# Patient Record
Sex: Female | Born: 1955 | Race: White | Hispanic: No | Marital: Single | Smoking: Current every day smoker
Health system: Southern US, Community
[De-identification: ages and names within clinical notes are randomized; demographics above are authoritative.]

## PROBLEM LIST (undated history)

## (undated) DIAGNOSIS — E119 Type 2 diabetes mellitus without complications: Secondary | ICD-10-CM

## (undated) DIAGNOSIS — M5126 Other intervertebral disc displacement, lumbar region: Secondary | ICD-10-CM

## (undated) DIAGNOSIS — K648 Other hemorrhoids: Secondary | ICD-10-CM

## (undated) DIAGNOSIS — G8929 Other chronic pain: Secondary | ICD-10-CM

## (undated) DIAGNOSIS — M1990 Unspecified osteoarthritis, unspecified site: Secondary | ICD-10-CM

## (undated) DIAGNOSIS — E039 Hypothyroidism, unspecified: Secondary | ICD-10-CM

## (undated) DIAGNOSIS — K219 Gastro-esophageal reflux disease without esophagitis: Secondary | ICD-10-CM

## (undated) DIAGNOSIS — R519 Headache, unspecified: Secondary | ICD-10-CM

## (undated) DIAGNOSIS — M810 Age-related osteoporosis without current pathological fracture: Secondary | ICD-10-CM

## (undated) DIAGNOSIS — F329 Major depressive disorder, single episode, unspecified: Secondary | ICD-10-CM

## (undated) DIAGNOSIS — I10 Essential (primary) hypertension: Secondary | ICD-10-CM

## (undated) DIAGNOSIS — E049 Nontoxic goiter, unspecified: Secondary | ICD-10-CM

## (undated) DIAGNOSIS — F32A Depression, unspecified: Secondary | ICD-10-CM

## (undated) DIAGNOSIS — M797 Fibromyalgia: Secondary | ICD-10-CM

## (undated) DIAGNOSIS — D509 Iron deficiency anemia, unspecified: Secondary | ICD-10-CM

## (undated) DIAGNOSIS — K5790 Diverticulosis of intestine, part unspecified, without perforation or abscess without bleeding: Secondary | ICD-10-CM

## (undated) DIAGNOSIS — R51 Headache: Secondary | ICD-10-CM

## (undated) HISTORY — DX: Headache, unspecified: R51.9

## (undated) HISTORY — PX: BACK SURGERY: SHX140

## (undated) HISTORY — DX: Fibromyalgia: M79.7

## (undated) HISTORY — DX: Age-related osteoporosis without current pathological fracture: M81.0

## (undated) HISTORY — DX: Major depressive disorder, single episode, unspecified: F32.9

## (undated) HISTORY — DX: Other hemorrhoids: K64.8

## (undated) HISTORY — DX: Headache: R51

## (undated) HISTORY — PX: DILATION AND CURETTAGE OF UTERUS: SHX78

## (undated) HISTORY — DX: Other chronic pain: G89.29

## (undated) HISTORY — DX: Other intervertebral disc displacement, lumbar region: M51.26

## (undated) HISTORY — PX: OTHER SURGICAL HISTORY: SHX169

## (undated) HISTORY — DX: Iron deficiency anemia, unspecified: D50.9

## (undated) HISTORY — DX: Gastro-esophageal reflux disease without esophagitis: K21.9

## (undated) HISTORY — DX: Diverticulosis of intestine, part unspecified, without perforation or abscess without bleeding: K57.90

## (undated) HISTORY — DX: Unspecified osteoarthritis, unspecified site: M19.90

## (undated) HISTORY — DX: Type 2 diabetes mellitus without complications: E11.9

## (undated) HISTORY — DX: Depression, unspecified: F32.A

---

## 1998-02-15 ENCOUNTER — Other Ambulatory Visit: Admission: RE | Admit: 1998-02-15 | Discharge: 1998-02-15 | Payer: Self-pay | Admitting: Obstetrics and Gynecology

## 1999-09-22 ENCOUNTER — Emergency Department (HOSPITAL_COMMUNITY): Admission: EM | Admit: 1999-09-22 | Discharge: 1999-09-22 | Payer: Self-pay | Admitting: *Deleted

## 1999-12-16 ENCOUNTER — Encounter: Admission: RE | Admit: 1999-12-16 | Discharge: 1999-12-16 | Payer: Self-pay | Admitting: Obstetrics and Gynecology

## 1999-12-16 ENCOUNTER — Encounter: Payer: Self-pay | Admitting: Obstetrics and Gynecology

## 2000-01-15 ENCOUNTER — Encounter: Admission: RE | Admit: 2000-01-15 | Discharge: 2000-01-15 | Payer: Self-pay | Admitting: Family Medicine

## 2000-01-15 ENCOUNTER — Encounter: Payer: Self-pay | Admitting: Family Medicine

## 2000-07-14 ENCOUNTER — Encounter: Admission: RE | Admit: 2000-07-14 | Discharge: 2000-07-14 | Payer: Self-pay | Admitting: Family Medicine

## 2000-07-14 ENCOUNTER — Inpatient Hospital Stay (HOSPITAL_COMMUNITY): Admission: EM | Admit: 2000-07-14 | Discharge: 2000-07-17 | Payer: Self-pay | Admitting: Family Medicine

## 2000-07-14 ENCOUNTER — Encounter: Payer: Self-pay | Admitting: Family Medicine

## 2001-01-07 ENCOUNTER — Encounter: Admission: RE | Admit: 2001-01-07 | Discharge: 2001-01-07 | Payer: Self-pay | Admitting: Obstetrics and Gynecology

## 2001-01-07 ENCOUNTER — Encounter: Payer: Self-pay | Admitting: Obstetrics and Gynecology

## 2001-01-27 ENCOUNTER — Encounter: Payer: Self-pay | Admitting: Obstetrics and Gynecology

## 2001-01-27 ENCOUNTER — Encounter: Admission: RE | Admit: 2001-01-27 | Discharge: 2001-01-27 | Payer: Self-pay | Admitting: Obstetrics and Gynecology

## 2001-02-07 ENCOUNTER — Encounter: Payer: Self-pay | Admitting: Internal Medicine

## 2001-02-07 ENCOUNTER — Emergency Department (HOSPITAL_COMMUNITY): Admission: EM | Admit: 2001-02-07 | Discharge: 2001-02-07 | Payer: Self-pay | Admitting: Internal Medicine

## 2001-10-18 ENCOUNTER — Emergency Department (HOSPITAL_COMMUNITY): Admission: EM | Admit: 2001-10-18 | Discharge: 2001-10-18 | Payer: Self-pay | Admitting: *Deleted

## 2001-11-25 ENCOUNTER — Ambulatory Visit (HOSPITAL_COMMUNITY): Admission: RE | Admit: 2001-11-25 | Discharge: 2001-11-25 | Payer: Self-pay | Admitting: Family Medicine

## 2001-11-25 ENCOUNTER — Encounter: Payer: Self-pay | Admitting: Family Medicine

## 2002-03-10 ENCOUNTER — Encounter: Payer: Self-pay | Admitting: Family Medicine

## 2002-03-10 ENCOUNTER — Ambulatory Visit (HOSPITAL_COMMUNITY): Admission: RE | Admit: 2002-03-10 | Discharge: 2002-03-10 | Payer: Self-pay | Admitting: Family Medicine

## 2002-12-31 ENCOUNTER — Emergency Department (HOSPITAL_COMMUNITY): Admission: EM | Admit: 2002-12-31 | Discharge: 2002-12-31 | Payer: Self-pay | Admitting: Emergency Medicine

## 2003-04-11 ENCOUNTER — Ambulatory Visit (HOSPITAL_COMMUNITY): Admission: RE | Admit: 2003-04-11 | Discharge: 2003-04-11 | Payer: Self-pay | Admitting: Family Medicine

## 2003-09-18 ENCOUNTER — Emergency Department (HOSPITAL_COMMUNITY): Admission: EM | Admit: 2003-09-18 | Discharge: 2003-09-18 | Payer: Self-pay | Admitting: *Deleted

## 2004-02-16 ENCOUNTER — Ambulatory Visit (HOSPITAL_COMMUNITY): Admission: RE | Admit: 2004-02-16 | Discharge: 2004-02-16 | Payer: Self-pay | Admitting: Family Medicine

## 2004-05-07 ENCOUNTER — Other Ambulatory Visit: Admission: RE | Admit: 2004-05-07 | Discharge: 2004-05-07 | Payer: Self-pay

## 2004-05-10 ENCOUNTER — Ambulatory Visit (HOSPITAL_COMMUNITY): Admission: RE | Admit: 2004-05-10 | Discharge: 2004-05-10 | Payer: Self-pay | Admitting: Obstetrics and Gynecology

## 2004-08-29 ENCOUNTER — Ambulatory Visit (HOSPITAL_COMMUNITY): Admission: RE | Admit: 2004-08-29 | Discharge: 2004-08-29 | Payer: Self-pay | Admitting: Family Medicine

## 2005-04-18 ENCOUNTER — Ambulatory Visit (HOSPITAL_COMMUNITY): Admission: RE | Admit: 2005-04-18 | Discharge: 2005-04-18 | Payer: Self-pay | Admitting: Family Medicine

## 2006-02-24 ENCOUNTER — Ambulatory Visit (HOSPITAL_COMMUNITY): Admission: RE | Admit: 2006-02-24 | Discharge: 2006-02-24 | Payer: Self-pay | Admitting: Family Medicine

## 2006-12-31 ENCOUNTER — Ambulatory Visit (HOSPITAL_COMMUNITY): Admission: RE | Admit: 2006-12-31 | Discharge: 2006-12-31 | Payer: Self-pay | Admitting: Family Medicine

## 2007-02-26 ENCOUNTER — Ambulatory Visit (HOSPITAL_COMMUNITY): Admission: RE | Admit: 2007-02-26 | Discharge: 2007-02-26 | Payer: Self-pay | Admitting: Family Medicine

## 2007-09-19 ENCOUNTER — Emergency Department (HOSPITAL_COMMUNITY): Admission: EM | Admit: 2007-09-19 | Discharge: 2007-09-19 | Payer: Self-pay | Admitting: Emergency Medicine

## 2008-02-10 ENCOUNTER — Ambulatory Visit (HOSPITAL_COMMUNITY): Admission: RE | Admit: 2008-02-10 | Discharge: 2008-02-10 | Payer: Self-pay | Admitting: Family Medicine

## 2008-02-29 ENCOUNTER — Ambulatory Visit (HOSPITAL_COMMUNITY): Admission: RE | Admit: 2008-02-29 | Discharge: 2008-02-29 | Payer: Self-pay | Admitting: Family Medicine

## 2008-07-04 ENCOUNTER — Emergency Department (HOSPITAL_COMMUNITY): Admission: EM | Admit: 2008-07-04 | Discharge: 2008-07-04 | Payer: Self-pay | Admitting: Emergency Medicine

## 2008-10-30 ENCOUNTER — Encounter: Payer: Self-pay | Admitting: Gastroenterology

## 2008-11-07 ENCOUNTER — Encounter: Payer: Self-pay | Admitting: Gastroenterology

## 2008-11-15 ENCOUNTER — Encounter: Payer: Self-pay | Admitting: Gastroenterology

## 2008-12-01 ENCOUNTER — Encounter: Payer: Self-pay | Admitting: Gastroenterology

## 2009-01-31 ENCOUNTER — Ambulatory Visit: Payer: Self-pay | Admitting: Gastroenterology

## 2009-01-31 DIAGNOSIS — E1122 Type 2 diabetes mellitus with diabetic chronic kidney disease: Secondary | ICD-10-CM

## 2009-01-31 DIAGNOSIS — Z794 Long term (current) use of insulin: Secondary | ICD-10-CM

## 2009-01-31 DIAGNOSIS — K219 Gastro-esophageal reflux disease without esophagitis: Secondary | ICD-10-CM

## 2009-01-31 DIAGNOSIS — D509 Iron deficiency anemia, unspecified: Secondary | ICD-10-CM

## 2009-01-31 DIAGNOSIS — R1310 Dysphagia, unspecified: Secondary | ICD-10-CM | POA: Insufficient documentation

## 2009-01-31 DIAGNOSIS — N183 Chronic kidney disease, stage 3 (moderate): Secondary | ICD-10-CM

## 2009-02-01 ENCOUNTER — Ambulatory Visit: Payer: Self-pay | Admitting: Gastroenterology

## 2009-02-02 ENCOUNTER — Encounter: Payer: Self-pay | Admitting: Gastroenterology

## 2009-02-06 ENCOUNTER — Ambulatory Visit: Payer: Self-pay | Admitting: Gastroenterology

## 2009-02-06 LAB — CONVERTED CEMR LAB
Basophils Absolute: 0.1 10*3/uL (ref 0.0–0.1)
Basophils Relative: 1.1 % (ref 0.0–3.0)
Eosinophils Absolute: 0.1 10*3/uL (ref 0.0–0.7)
Eosinophils Relative: 1.4 % (ref 0.0–5.0)
HCT: 30.6 % — ABNORMAL LOW (ref 36.0–46.0)
Hemoglobin: 10.2 g/dL — ABNORMAL LOW (ref 12.0–15.0)
Lymphocytes Relative: 19.4 % (ref 12.0–46.0)
Lymphs Abs: 2 10*3/uL (ref 0.7–4.0)
MCHC: 33.2 g/dL (ref 30.0–36.0)
MCV: 80.5 fL (ref 78.0–100.0)
Monocytes Absolute: 0.4 10*3/uL (ref 0.1–1.0)
Monocytes Relative: 3.9 % (ref 3.0–12.0)
Neutro Abs: 7.7 10*3/uL (ref 1.4–7.7)
Neutrophils Relative %: 74.2 % (ref 43.0–77.0)
Platelets: 394 10*3/uL (ref 150.0–400.0)
RBC: 3.79 M/uL — ABNORMAL LOW (ref 3.87–5.11)
RDW: 17.4 % — ABNORMAL HIGH (ref 11.5–14.6)
WBC: 10.3 10*3/uL (ref 4.5–10.5)

## 2009-03-02 ENCOUNTER — Ambulatory Visit (HOSPITAL_COMMUNITY): Admission: RE | Admit: 2009-03-02 | Discharge: 2009-03-02 | Payer: Self-pay | Admitting: Family Medicine

## 2010-04-09 ENCOUNTER — Ambulatory Visit (HOSPITAL_COMMUNITY)
Admission: RE | Admit: 2010-04-09 | Discharge: 2010-04-09 | Payer: Self-pay | Source: Home / Self Care | Attending: Family Medicine | Admitting: Family Medicine

## 2010-07-03 LAB — GLUCOSE, CAPILLARY
Glucose-Capillary: 117 mg/dL — ABNORMAL HIGH (ref 70–99)
Glucose-Capillary: 129 mg/dL — ABNORMAL HIGH (ref 70–99)
Glucose-Capillary: 158 mg/dL — ABNORMAL HIGH (ref 70–99)
Glucose-Capillary: 91 mg/dL (ref 70–99)

## 2010-08-02 ENCOUNTER — Encounter: Payer: Self-pay | Admitting: Cardiology

## 2010-08-16 NOTE — Discharge Summary (Signed)
[facility]  Patient:    Linda Riggs, Linda Riggs                       MRN: [id number] Adm. Date:  [id number] Disc. Date: [id number] Attending:  Pamelia Hoit CC:         [facility]  Verlin Grills, M.D.  Katherine Roan, M.D.   Discharge Summary  ADMISSION DIAGNOSES: 1. Chronic intermittent abdominal pain. 2. Nausea and vomiting. 3. Microcytic anemia.  DISCHARGE DIAGNOSES: 1. Elevated liver transaminases probably secondary to nonalcoholic    steatohepatitis. 2. Iron deficiency anemia. 3. Hematochezia secondary to internal hemorrhoids. 4. Gastroesophageal reflux disease. 5. Type 2 diabetes newly diagnosed this admission. 6. History of depression. 7. Diverticulosis. 8. History of chronic headaches and chronic low back pain.  CONSULTANTS:  Verlin Grills, M.D.  PROCEDURES: 1. Esophagogastroduodenoscopy. 2. Colonoscopy.  BRIEF HISTORY:  This is a 55 year old, divorced, white female from [place], [place], with a history of recurrent abdominal pain for several months.  She has had several episodes of nausea and vomiting recently. She had been seen several times in the office recently with extensive laboratory work and on the day prior to admission, pelvic and abdominal CT scan, as well as abdominal ultrasound.  These studies were significant only for a tiny incidental gallbladder polyp, but no gallstones and fatty liver changes noted on CT of the abdomen.  She had not had any fevers prior to admission and no significant bowel changes.  She was admitted for symptom control and further evaluation.  ADMISSION LABORATORY DATA:  Her white blood cell count was 3300, hemoglobin 10.5, MCV 77.5, platelets 162,000, reticulocyte count normal, and sedimentation rate 39.  The PT was normal.  Iron saturation was decreased at 17%, ferritin elevated at 529, TIBC normal at 268.  Her calcium was 8.2, albumin 2.6, AST 60,  ALT 104, alkaline phosphatase 154, lipase normal, amylase normal, and total bilirubin normal.  HOSPITAL COURSE:  This is a 55 year old with a history of intermittent chronic symptoms of abdominal pain, nausea, and vomiting as above.  She was admitted for further evaluation and management of her symptoms.  She was maintained on Nexium which she had been started on prior to admission, as well as Reglan. Those were chronic medications.  A consult was made with Molli Hazard B. Daphine Deutscher, M.D.  He felt that further work-up of her nonspecific anemia was in order. She underwent EGD and colonoscopy on July 16, 2000.  Her EGD showed no significant abnormalities.  Her colonoscopy to the cecum was normal with the exception of left colon diverticulosis and some incidental internal hemorrhoids.  She had other laboratory tests to evaluate her elevated liver transaminases, which included ANA which was negative and hepatitis studies which were all negative.  Several other studies including seruloplasmin, serum protein electrophoresis, ANA, and antismooth muscle antibody studies were still pending at the time of discharge.  Symptomatically she improved after admission with basically resolution of her nausea and vomiting with IV fluids and Phenergan and no further abdominal or pelvic pain.  The suggestion had been made for GYN consult with S. Kyra Manges, M.D., her regular gynecologist.  Since her pelvic CT had been normal and her symptoms were basically resolving, it was elected to pursue this further as an outpatient. It was also noted that she had elevated CBG on admission with several CBGs over 126 and hemoglobin A1C which was high normal at 6.5%.  There is a very strong family history of type 2 diabetes.  It is felt that she will need close follow-up for diabetic teaching and weight loss following discharge.  At the time of discharge, her symptoms had basically resolved and with negative studies as above it  was felt that she could be followed further as an outpatient for further evaluation.  DISCHARGE CONDITION:  Improved.  DISCHARGE FOLLOW-UP: 1. [facility] approximately two weeks after discharge with    Rema Fendt, M.D., her primary care [place]. 2. With Katherine Roan, M.D., gynecologist, two to three weeks after    discharge. 3. She will need follow-up of several labs which are pending at the time of    discharge, including seruloplasmin level, serum protein electrophoresis,    AMA. and antismooth muscle antibodies.  The plan is that if her liver    transaminases remain greater than twofold over normal for over six months    and the above studies are negative, consider scheduling liver biopsy at    [facility]. [facility].  DISCHARGE MEDICATIONS: 1. Nexium 40 mg p.o. q.d. 2. Premphase 0.625/5 mg one p.o. q.d. 3. Reglan 5 mg q.i.d. 4. Levsin sublingually one to two q.4h. p.r.n. 5. Celexa 20 mg p.o. q.d.  DISCHARGE INSTRUCTIONS:  She is to call or follow-up if she develops any worsening abdominal pain, fever, or other problems prior to her scheduled follow-up.  She is also instructed to reduce her concentrated sweets.  She will need to be set up for diabetic teaching as an outpatient, which can be done throughout office. DD:  07/17/00 TD:  07/18/00 Job: [id number]

## 2010-08-16 NOTE — Op Note (Signed)
   NAME:  Linda Riggs, Linda Riggs                        ACCOUNT NO.:  [id number]   MEDICAL RECORD NO.:  [id number]                   PATIENT TYPE:  OUT   LOCATION:  RAD                                  FACILITY:  [facility]   PHYSICIAN:  Danise Edge, M.D.                DATE OF BIRTH:  06-07-1955   DATE OF PROCEDURE:  01/06/2002  DATE OF DISCHARGE:  11/25/2001                                 OPERATIVE REPORT   REFERRING PHYSICIAN:  L. Lupe Carney, M.D.   PROCEDURE PERFORMED:  Colonoscopy.   ENDOSCOPIST:  Charolett Bumpers, M.D.   INDICATIONS FOR PROCEDURE:  The patient is a 55 year old female born September 10, 1938.  The patient has unexplained left lower quadrant and right lower  quadrant cramping abdominal pain.   I discussed with the patient the complications associated with colonoscopy  and polypectomy including a 15 per 1000 risk of bleeding and 4 per 1000 risk  of colon perforation requiring surgical repair.  The patient has signed the  operative permit.   PREMEDICATION:  Fentanyl 50 mcg, Versed 5 mg.   INSTRUMENT USED:  Pediatric Olympus video colonoscope.   DESCRIPTION OF PROCEDURE:  After obtaining informed consent, the patient was  placed in the left lateral decubitus position.  I administered intravenous  fentanyl and intravenous Versed to achieve conscious sedation for the  procedure.  The patient's blood pressure, oxygen saturations and cardiac  rhythm were monitored throughout the procedure and documented in the medical  record.   Anal inspection was normal.  Digital rectal exam was normal.  The pediatric  Olympus video colonoscope was introduced into the rectum and advanced to 35  cm from the anal verge.  Due to colonic loop formation which could not be  controlled by applying external abdominal pressure and repositioning the  patient from the left lateral decubitus position to the supine position and  finally the right lateral decubitus position, a complete colonoscopy  was not  performed.   Endoscopic appearance of the rectum was normal.  Endoscopic appearance of  the distal sigmoid colon revealed left colonic diverticulosis.   ASSESSMENT:  Left colonic diverticulosis by flexible proctosigmoidoscopy  performed to 35 cm from the anal verge.  A complete colonoscopy was not  performed due to sigmoid colon loop formation.    PLAN:  I will schedule Ms. Waibel for an air contrast barium enema to be  performed at [facility] later this morning.                                                 Danise Edge, M.D.    MJ/MEDQ  D:  01/06/2002  T:  01/06/2002  Job:  [id number]   cc:   L. Lupe Carney, M.D.

## 2010-08-16 NOTE — Procedures (Signed)
[facility]  Patient:    Linda Riggs, Linda Riggs                       MRN: [id number] Proc. Date: 07/16/00 Attending:  Verlin Grills, M.D. CC:         Gloriajean Dell. Andrey Campanile, M.D.   Procedure Report  PROCEDURES:  Esophagogastroduodenoscopy and colonoscopy.  REFERRING PHYSICIAN:  Gloriajean Dell. Andrey Campanile, M.D.  PROCEDURE INDICATION:  Linda Riggs is a 55 year old female.  She tells me she has had chronic intermittent cramping lower abdominal pain, intermittent hematochezia, abnormal menses, low back pain, and dyspareunia. She reports no diarrhea or constipation.  In January 2002 her lower abdominal cramps became more frequent and intense.  For approximately 1-1/2 months she has had intermittent nausea and vomiting without heartburn, dysphagia, or odynophagia.  She denies weight loss and anorexia.  She underwent a D&C for menorrhagia in 1999.  She denies a family history of colon cancer.  PAST MEDICAL HISTORY:  Depression, chronic headaches, chronic low back pain, gastroesophageal reflux disease with a hiatal hernia by upper GI x-ray series, ruptured disc, 1999 D&C for abnormal uterine bleeding, iron deficiency anemia, left knee arthroscopy.  LABORATORY DATA:  July 13, 2000, thyroid stimulating hormone was slightly elevated with a normal free thyroxine index.  Hemoglobin 10.8 gm, MCV 79, white blood cell count 4600, platelet count 166,000.  Complete metabolic profile was abnormal for a glucose 189, albumin 3.2, alkaline phosphatase 189, GGT 220, SGOT 74, SGPT 101.  X-RAYS:  July 14, 2000, abdominal ultrasound was normal except for the presence of a gallbladder polyp.  CT scan of the abdomen and pelvis July 14, 2000, revealed a "fatty liver" and renal cyst; otherwise normal CT scan of the abdomen and pelvis.  CONSENT:  I discussed with [name] the complications associated with esophagogastroduodenoscopy including intestinal bleeding,  intestinal perforation.  [name] has signed the operative permit.  ENDOSCOPIST:  Verlin Grills, M.D.  PREMEDICATION:  Demerol 100 mg, Versed 10 mg.  ENDOSCOPE:  Olympus gastroscope.  PROCEDURE:  Esophagogastroduodenoscopy.  DESCRIPTION OF PROCEDURE:  After obtaining informed consent, the patient was placed in the left lateral decubitus position.  I administered intravenous Demerol and intravenous Versed to achieve conscious sedation for the procedure.  The patients blood pressure, oxygen saturation, and cardiac rhythm were monitored throughout the procedure and documented in the medical record.  The Olympus gastroscope was passed through the posterior hypopharynx into the proximal esophagus without difficulty.  The hypopharynx and larynx appeared normal.  I did not visualize the vocal cords.  Esophagoscopy:  The proximal mid and lower segments of the esophagus appeared normal.  Endoscopically there was no evidence for the presence of Barretts esophagus, erosive esophagitis, esophageal mucosal scarring, or esophageal ulceration.  Gastroscopy:  Retroflex view of the gastric cardiac and fundus was normal. The gastric body, antrum, and pylorus appeared normal.  Duodenoscopy:  The duodenal bulb and descending duodenum appeared normal.  ASSESSMENT:  Normal esophagogastroduodenoscopy.  PROCEDURE:  Proctocolonoscopy to the cecum.  Anal inspection was normal. Digital rectal exam was normal.  The Olympus pediatric video colonoscope was introduced into the rectum and under direct vision advanced to the cecum and identified by normal appearing ileocecal valve.  Colonic preparation for the exam today was excellent.  Rectum normal.  Sigmoid colon and descending colon:  Scattered colonic diverticulosis, but no signs of diverticulitis.  Splenic flexure normal.  Transverse colon normal.  Hepatic flexure normal.  Ascending  colon normal.  Cecum and ileocecal valve  normal.  ASSESSMENT: 1. Left colonic diverticulosis. 2. Moderate size, but nonbleeding internal hemorrhoids. 3. No endoscopic evidence for the presence of colorectal neoplasia or    inflammatory bowel disease.  ASSESSMENT:  I find no signs of gastrointestinal disease based on upper and lower gastrointestinal endoscopy today.  RECOMMENDATIONS:  Gynecologic consultation with Dr. Kyra Manges. DD:  07/16/00 TD:  07/17/00 Job: [id number]

## 2010-08-16 NOTE — H&P (Signed)
[facility]  Patient:    Linda Riggs, Linda Riggs                       MRN: [id number] Adm. Date:  [id number] Attending:  Pamelia Hoit                         History and Physical  IDENTIFICATION:  A 55 year old divorced white female from [place].  CHIEF COMPLAINT:  Abdominal pains, nausea, vomiting, back pains, headaches chronically.  HISTORY OF PRESENT ILLNESS:  Patient has had recurrent abdominal pains for several months.  No bowel changes.  She has had more nausea and vomiting and pain recently.  Was seen in the office yesterday and today.  Lab work yesterday and x-rays today.  Pelvic and abdominal CT scan and abdominal ultrasound were nonrevealing today.  She is admitted for symptom control, further evaluation, and treatment.  REVIEW OF SYSTEMS:  Also positive for multiple muscle pains, headaches, irregular and excessive periods.  Negative for bladder and bowel symptoms, fevers, chills, or respiratory symptoms.  Has been depressed in the past but feels that she just does not care now.  ALLERGIES:  SULFA and ASPIRIN.  I think these cause GI symptoms.  MEDICATIONS:  Premphase.  Vioxx 25 mg a day for knee pain.  Nexium 40 mg a day for GERD.  Reglan a.c. and h.s.  PAST MEDICAL HISTORY:  She is treated for GERD, history of depression.  Has had arthroscopic knee surgery for torn cartilage.  She quit smoking cigarettes a year ago.  She denies alcohol.  She has had a D&C in the past for irregular periods.  She has had two children.  A history of low back pain and disk disease.  SOCIAL AND FAMILY HISTORY:  She is one of nine children.  She has two children of her own, a boy and a girl.  Ex-husband was alcoholic.  Her mother is alive with diabetes, status post stroke and depression.  Father died of lung cancer. Patient works at [employer] in [place].  PHYSICAL EXAMINATION:  VITAL SIGNS:  Vital signs are normal, and she is afebrile.   Pulse 100, respirations 28, blood pressure 135/72.  Weight is 209.  GENERAL:  An obese white female, sleeping in bed with a man.  She is easily awakened.  Affect is flat.  Voice is monotone.  She is alert, oriented, and appropriate otherwise.  HEENT:  Unremarkable.  NECK:  Supple.  There is no adenopathy or thyroid enlargement or tenderness.  CHEST:  Lungs are clear to auscultation.  CARDIAC:  The heart exam is normal.  ABDOMEN:  Obese, soft, with active bowel sounds and diffusely, subjectively tender without rebound.  There is no mass.  BACK:  There is no back tenderness.  GENITOURINARY/RECTAL:  Deferred at this time.  EXTREMITIES:  Normal.  She moves all extremities well.  She has no peripheral edema.  No joint abnormalities of extremities.  SKIN:  Without a rash.  LABORATORY DATA:  Yesterday, nonfasting, white count 4000, platelets 166,000, hemoglobin 10.8, MCV 79, 85% neutrophils, 9% lymphocytes, 3% monos.  Sodium 136, potassium 3.4, BUN 10, creatinine 0.5, glucose 189.  Total cholesterol 178, triglycerides 208, HDL 47, LDL 89.  Liver functions slightly elevated with SGOT of 74, SGPT 101, total bilirubin normal.  Thyroid functions normal. Ultrasound of abdomen shows possible polyps in gallbladder.  No stones.  CT scan of abdomen and pelvis shows a fatty  infiltration of the liver.  Benign renal cysts.  Normal uterus and ovaries.  No fluid.  ASSESSMENT:  Chronic, recurring abdominal pains with nausea and vomiting and dehydration.  This is in a 55 year old obese white female.  Gastroesophageal reflux disease.  Obesity.  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  Iron deficiency anemia secondary to difficult, irregular periods. High nonfasting blood sugar.  PLAN:  Will ask GI to see.  Check hemoglobin A1C and CBGs.  N.P.O. and IV fluids for now.  Advance diet carefully.  Start an antidepressant, Celexa 20 mg a day.  Further treatment and evaluation  depending on results.  Will check hemoglobin A1C and amylase and lipase. DD:  07/14/00 TD:  07/15/00 Job: [id number]

## 2010-08-28 ENCOUNTER — Ambulatory Visit: Payer: Self-pay | Admitting: Cardiology

## 2010-09-06 ENCOUNTER — Telehealth: Payer: Self-pay | Admitting: *Deleted

## 2010-09-06 ENCOUNTER — Encounter: Payer: Self-pay | Admitting: Cardiology

## 2010-09-06 ENCOUNTER — Encounter: Payer: Self-pay | Admitting: *Deleted

## 2010-09-06 ENCOUNTER — Ambulatory Visit (INDEPENDENT_AMBULATORY_CARE_PROVIDER_SITE_OTHER): Payer: Medicare Other | Admitting: Cardiology

## 2010-09-06 DIAGNOSIS — R079 Chest pain, unspecified: Secondary | ICD-10-CM

## 2010-09-06 DIAGNOSIS — IMO0001 Reserved for inherently not codable concepts without codable children: Secondary | ICD-10-CM

## 2010-09-06 DIAGNOSIS — Z720 Tobacco use: Secondary | ICD-10-CM

## 2010-09-06 DIAGNOSIS — R0789 Other chest pain: Secondary | ICD-10-CM

## 2010-09-06 DIAGNOSIS — F17200 Nicotine dependence, unspecified, uncomplicated: Secondary | ICD-10-CM

## 2010-09-06 DIAGNOSIS — E782 Mixed hyperlipidemia: Secondary | ICD-10-CM

## 2010-09-06 NOTE — Assessment & Plan Note (Signed)
Improved, but not entirely resolved, outlined above. Baseline ECG shows a right bundle branch block pattern. Cardiac risk factors include active tobacco abuse, hyperlipidemia, type 2 diabetes mellitus. Plan is to proceed with an exercise Cardiolite, converting to [place] if needed. We can inform her of the results, and arrange followup as necessary. If her testing is low risk, would recommend general risk factor modification strategies, smoking cessation, limited caffeine, and regular exercise.

## 2010-09-06 NOTE — Telephone Encounter (Signed)
AUTH # [id number] EXPIRED 10/21/10

## 2010-09-06 NOTE — Assessment & Plan Note (Signed)
Followed by Dr. Nyland. 

## 2010-09-06 NOTE — Assessment & Plan Note (Signed)
On Lipitor, followed by Dr. Nyland. 

## 2010-09-06 NOTE — Assessment & Plan Note (Signed)
We discussed smoking cessation strategies today. 

## 2010-09-06 NOTE — Progress Notes (Signed)
Clinical Summary Linda Riggs is a 55 y.o.female referred for cardiology consultation by Dr. Lysbeth Galas. She has no history of coronary disease, but cardiac risk factors noted below. She reports an episode of chest pain that occurred at rest back in April, a sharp sensation that lasted for several minutes. Since then she has tried to cut back on smoking, watch her caffeine intake, and was also prescribed Celebrex by Dr. Lysbeth Galas. Reports that her symptoms have improved, however have not completely resolved. She does not report any clearly exertional component to the symptoms.  Faxed copy of her ECG shows normal sinus rhythm with a right bundle branch block pattern. No old tracing for comparison. She has not undergone any prior ischemic evaluation.  She does have a history of iron deficiency anemia and hematochezia, previously assessed by Dr. Arlyce Dice. Known history of internal hemorrhoids and diverticulosis. She reports no obvious melena or hematochezia. Also history of reflux and hiatal hernia.  She continues to work on smoking cessation. We discussed strategies to consider. She does not exercise regularly, but states that she feels better when she gets out of the garden. We discussed diet and exercise.  No reported palpitations or syncope.  Allergies  Allergen Reactions  . Flonase (Fluticasone Propionate) Itching  . Prednisone Itching  . Tramadol Itching    Current outpatient prescriptions:alendronate (FOSAMAX) 70 MG tablet, Take 70 mg by mouth every 7 (seven) days. Take with a full glass of water on an empty stomach. , Disp: , Rfl: ;  ALPRAZolam (XANAX) 1 MG tablet, Take 1 mg by mouth at bedtime as needed.  , Disp: , Rfl: ;  atorvastatin (LIPITOR) 40 MG tablet, Take 40 mg by mouth daily.  , Disp: , Rfl:  Calcium Carbonate-Vit D-Min (CALCIUM 600 + MINERALS) 600-200 MG-UNIT TABS, Take by mouth.  , Disp: , Rfl: ;  celecoxib (CELEBREX) 200 MG capsule, Take 145 mg by mouth daily.  , Disp: , Rfl: ;  DULoxetine  (CYMBALTA) 30 MG capsule, Take 30 mg by mouth daily.  , Disp: , Rfl: ;  escitalopram (LEXAPRO) 20 MG tablet, Take 20 mg by mouth daily.  , Disp: , Rfl:  esomeprazole (NEXIUM) 40 MG capsule, Take 40 mg by mouth daily before breakfast.  , Disp: , Rfl: ;  fexofenadine (ALLEGRA) 180 MG tablet, Take 180 mg by mouth daily.  , Disp: , Rfl: ;  glyBURIDE-metformin (GLUCOVANCE) 5-500 MG per tablet, Take 1 tablet by mouth daily with breakfast.  , Disp: , Rfl: ;  metFORMIN (GLUCOPHAGE) 500 MG tablet, Take 500 mg by mouth 2 (two) times daily with a meal.  , Disp: , Rfl:  potassium gluconate 595 MG TABS, Take 595 mg by mouth.  , Disp: , Rfl: ;  quinapril (ACCUPRIL) 10 MG tablet, Take 10 mg by mouth at bedtime.  , Disp: , Rfl: ;  senna (PX VEGETABLE LAXATIVE) 8.6 MG tablet, Take 1 tablet by mouth daily.  , Disp: , Rfl: ;  sitaGLIPtan (JANUVIA) 100 MG tablet, Take 100 mg by mouth daily.  , Disp: , Rfl: ;  TRAZODONE HCL PO, Take 100 mg by mouth daily at 8 pm. Take one tablet at bedtime. , Disp: , Rfl:   Past Medical History  Diagnosis Date  . Depression   . Fibromyalgia   . Arthritis   . Type 2 diabetes mellitus   . Iron deficiency anemia     Dr. Arlyce Dice 11/10  . GERD (gastroesophageal reflux disease)   . Chronic headaches   .  Diverticulosis   . Internal hemorrhoids     Past Surgical History  Procedure Date  . Arthroscopic left knee surgery   . Dilation and curettage of uterus     Family History  Problem Relation Age of Onset  . Diabetes type II Mother   . Diabetes type II Sister   . Diabetes type II Father   . Cancer Father     Lung    Social History Linda Riggs reports that she has been smoking Cigarettes.  She has been smoking about 1 pack per day. She does not have any smokeless tobacco history on file. Linda Riggs reports that she does not drink alcohol.  Review of Systems Has problems with chronic knee pain, has undergone bilateral arthroscopic procedures. Otherwise reviewed and  negative.  Physical Examination Filed Vitals:   09/06/10 0932  BP: 110/67  Pulse: 88  Temp: 98.1 F (36.7 C)  Resp: 18  Chronically ill-appearing woman, overweight, in no acute distress. HEENT: Conjunctiva and lids are normal, oropharynx with poor dentition. Neck: Supple, no elevated JVP or carotid bruits, no thyromegaly. Lungs: Are to auscultation, nonlabored. Cardiac: Regular rate and rhythm, no significant murmur or S3 gallop. Abdomen: Nontender, bowel sounds present. Skin: Warm and dry. Skeletal: No kyphosis. Extremities: No pitting edema, distal pulses 1-2+. Neuropsychiatric: Alert and oriented x3, affect appropriate.    Problem List and Plan

## 2010-09-06 NOTE — Telephone Encounter (Signed)
Exercise Cardiolite (may convert to Lexiscan if needed) Weight 180 DIAG( 786.50, 786.59, 272.2, 305.1, 250.02)   SET FOR 09-17-2010 @ [facility]

## 2010-09-06 NOTE — Patient Instructions (Signed)
Your physician has requested that you have an exercise stress Cardiolite. For further information please visit [email or web address] Please follow instruction sheet, as given. If the results of your test are normal or stable, you will receive a letter. If they are abnormal, the nurse will contact you by phone. Your physician recommends that you continue on your current medications as directed. Please refer to the Current Medication list given to you today.

## 2010-09-17 DIAGNOSIS — R072 Precordial pain: Secondary | ICD-10-CM

## 2011-04-15 ENCOUNTER — Other Ambulatory Visit (HOSPITAL_COMMUNITY): Payer: Self-pay | Admitting: Family Medicine

## 2011-04-15 DIAGNOSIS — Z139 Encounter for screening, unspecified: Secondary | ICD-10-CM

## 2011-04-21 ENCOUNTER — Ambulatory Visit (HOSPITAL_COMMUNITY)
Admission: RE | Admit: 2011-04-21 | Discharge: 2011-04-21 | Disposition: A | Discharge status: Home / Self Care | Payer: Medicare Other | Source: Ambulatory Visit | Attending: Family Medicine | Admitting: Family Medicine

## 2011-04-21 DIAGNOSIS — Z139 Encounter for screening, unspecified: Secondary | ICD-10-CM

## 2011-04-21 DIAGNOSIS — Z1231 Encounter for screening mammogram for malignant neoplasm of breast: Secondary | ICD-10-CM | POA: Insufficient documentation

## 2012-04-05 ENCOUNTER — Other Ambulatory Visit (HOSPITAL_COMMUNITY): Payer: Self-pay | Admitting: Family Medicine

## 2012-04-05 DIAGNOSIS — Z139 Encounter for screening, unspecified: Secondary | ICD-10-CM

## 2012-04-22 ENCOUNTER — Ambulatory Visit (HOSPITAL_COMMUNITY)
Admission: RE | Admit: 2012-04-22 | Discharge: 2012-04-22 | Disposition: A | Discharge status: Home / Self Care | Payer: Medicare Other | Source: Ambulatory Visit | Attending: Family Medicine | Admitting: Family Medicine

## 2012-04-22 DIAGNOSIS — Z1231 Encounter for screening mammogram for malignant neoplasm of breast: Secondary | ICD-10-CM | POA: Insufficient documentation

## 2012-04-22 DIAGNOSIS — Z139 Encounter for screening, unspecified: Secondary | ICD-10-CM

## 2012-10-21 ENCOUNTER — Other Ambulatory Visit (HOSPITAL_COMMUNITY): Payer: Self-pay | Admitting: "Endocrinology

## 2012-10-21 DIAGNOSIS — E049 Nontoxic goiter, unspecified: Secondary | ICD-10-CM

## 2012-10-26 ENCOUNTER — Ambulatory Visit (HOSPITAL_COMMUNITY)
Admission: RE | Admit: 2012-10-26 | Discharge: 2012-10-26 | Disposition: A | Discharge status: Home / Self Care | Payer: Medicare Other | Source: Ambulatory Visit | Attending: "Endocrinology | Admitting: "Endocrinology

## 2012-10-26 DIAGNOSIS — E049 Nontoxic goiter, unspecified: Secondary | ICD-10-CM | POA: Insufficient documentation

## 2013-02-01 ENCOUNTER — Other Ambulatory Visit (HOSPITAL_COMMUNITY): Payer: Self-pay | Admitting: "Endocrinology

## 2013-02-01 DIAGNOSIS — E042 Nontoxic multinodular goiter: Secondary | ICD-10-CM

## 2013-03-14 ENCOUNTER — Other Ambulatory Visit (HOSPITAL_COMMUNITY): Payer: Self-pay | Admitting: Family Medicine

## 2013-03-14 DIAGNOSIS — Z139 Encounter for screening, unspecified: Secondary | ICD-10-CM

## 2013-04-25 ENCOUNTER — Ambulatory Visit (HOSPITAL_COMMUNITY)
Admission: RE | Admit: 2013-04-25 | Discharge: 2013-04-25 | Disposition: A | Discharge status: Home / Self Care | Payer: Medicare Other | Source: Ambulatory Visit | Attending: Family Medicine | Admitting: Family Medicine

## 2013-04-25 ENCOUNTER — Ambulatory Visit (HOSPITAL_COMMUNITY)
Admission: RE | Admit: 2013-04-25 | Discharge: 2013-04-25 | Disposition: A | Discharge status: Home / Self Care | Payer: Medicare Other | Source: Ambulatory Visit | Attending: "Endocrinology | Admitting: "Endocrinology

## 2013-04-25 DIAGNOSIS — Z1231 Encounter for screening mammogram for malignant neoplasm of breast: Secondary | ICD-10-CM | POA: Insufficient documentation

## 2013-04-25 DIAGNOSIS — E042 Nontoxic multinodular goiter: Secondary | ICD-10-CM | POA: Insufficient documentation

## 2013-04-25 DIAGNOSIS — Z139 Encounter for screening, unspecified: Secondary | ICD-10-CM

## 2013-05-31 ENCOUNTER — Encounter (HOSPITAL_COMMUNITY): Payer: Self-pay | Admitting: Pharmacy Technician

## 2013-06-01 NOTE — Patient Instructions (Signed)
Linda Riggs  06/01/2013   Your procedure is scheduled on:  06/06/2013  Report to [facility] at  615 AM.  Call this number if you have problems the morning of surgery: [phone]   Remember:   Do not eat food or drink liquids after midnight.   Take these medicines the morning of surgery with A SIP OF WATER: xanax, celebrex, cymbalta.lexapro, nexium, allegra, levothyroxine, accupril   Do not wear jewelry, make-up or nail polish.  Do not wear lotions, powders, or perfumes.   Do not shave 48 hours prior to surgery. Men may shave face and neck.  Do not bring valuables to the hospital.  [facility] belongings or valuables.               Contacts, dentures or bridgework may not be worn into surgery.  Leave suitcase in the car. After surgery it may be brought to your room.  For patients admitted to the hospital, discharge time is determined by your treatment team.               Patients discharged the day of surgery will not be allowed to drive home.  Name and phone number of your driver: family  Special Instructions: Shower using CHG 2 nights before surgery and the night before surgery.  If you shower the day of surgery use CHG.  Use special wash - you have one bottle of CHG for all showers.  You should use approximately 1/3 of the bottle for each shower.   Please read over the following fact sheets that you were given: Pain Booklet, Coughing and Deep Breathing, Surgical Site Infection Prevention, Anesthesia Post-op Instructions and Care and Recovery After Surgery Thyroidectomy Thyroidectomy is the removal of part or all of your thyroid gland. Your thyroid gland is a butterfly-shaped gland at the base of your neck. It produces a substance called thyroid hormone, which regulates the physical and chemical processes that keep your body functioning and make energy available to your body (metabolism). The amount of thyroid gland tissue that is removed during a  thyroidectomy depends on the reason for the procedure. Typically, if only a part of your gland is removed, enough thyroid gland tissue remains to maintain normal function. If your entire thyroid gland is removed or if the amount of thyroid gland tissue remaining is inadequate to maintain normal function, you will need life-long treatment with thyroid hormone on a daily basis. Thyroidectomy maybe performed when you have the following conditions:  Thyroid nodules. These are small, abnormal collections of tissue that form inside the thyroid gland. If these nodules begin to enlarge at a rapid rate, a sample of tissue from the nodule is taken through a needle and examined (needle biopsy). This is done to determine if the nodules are cancerous. Depending on the outcome of this exam, thyroidectomy may be necessary.  Thyroid cancer.  Goiter, which is an enlarged thyroid gland. All or part of the thyroid gland may be removed if the gland has become so large that it causes difficulty breathing or swallowing.  Hyperthyroidism. This is when the thyroid gland produces too much thyroid hormone. Hypothyroidism can cause symptoms of fluctuating weight, intolerance to heat, irritability, shortness of breath, and chest pain. LET YOUR CAREGIVER KNOW ABOUT:   Allergies to food or medicine.  Medicines that you are taking, including vitamins, herbs, eyedrops, over-the-counter medicines, and creams.  Previous problems you have had with anesthetics or  numbing medicines.  History of bleeding problems or blood clots.  Previous surgeries you have had.  Other health problems, including diabetes and kidney problems, you have had.  Possibility of pregnancy, if this applies. BEFORE THE PROCEDURE   Do not eat or drink anything, including water, for at least 6 hours before the procedure.  Ask your caregiver whether you should stop taking certain medicines before the day of the procedure. PROCEDURE  There are different  ways that thyroidectomy is performed. For each type, you will be given a medicine to make you sleep (general anesthetic). The three main types of thyroidectomy are listed as follows:  Conventional thyroidectomy A cut (incision) in the center portion of your lower neck is made with a scalpel. Muscles below your skin are separated to gain access to your thyroid gland. Your thyroid gland is dissected from your windpipe (trachea). Often a drain is placed at the incision site to drain any blood that accumulates under the skin after the procedure. This drain will be removed before you go home. The wound from the incision should heal within 2 weeks.  Endoscopic thyroidectomy Small incisions are made in your lower neck. A small instrument (endoscope) is inserted under your skin at the incision sites. The endoscope used for thyroidectomy consists of 2 flexible tubes. Inside one of the tubes is a video camera that is used to guide the Careers adviser. Tools to remove the thyroid gland, including a tool to cut the gland (dissectors) and a suction device, are inserted through the other tube. The surgeon uses the dissectors to dissect the thyroid gland from the trachea and remove it.  Robotic thyroidectomy This procedure allows your thyroid gland to be removed through incisions in your armpit, your chest, or high in your neck. Instruments similar to endoscopes provide a 3-dimensional picture of the surgical site. Dissecting instruments are controlled by devices similar to joysticks. These devices allow more accurate manipulation of the instruments. After the blood supply to the gland is removed, the gland is cut into several pieces and removed through the incisions. RISKS AND COMPLICATIONS Complications associated with thyroidectomy are rare, but they can occur. Possible complications include:  A decrease in parathyroid hormone levels (hypoparathyroidism) Your parathyroid glands are located close behind your thyroid gland.  They are responsible for maintaining calcium levels inthe body. If they are damaged or removed, levels of calcium in the blood become low and nerves become irritable, which can cause muscle spasms. Medicines are available to treat this.  Bacterial infection This can often be treated with medicines that kill bacteria (antibiotics).  Damage to your voice box nerves This could cause hoarseness or complete loss of voice.  Bleeding or airway obstruction. AFTER THE PROCEDURE   You will rest in the recovery room as you wake up.  When you first wake up, your throat may feel slightly sore.  You will not be allowed to eat or drink until instructed otherwise.  You will be taken to your hospital room. You will usually stay at the hospital for 1 or 2 nights.  If a drain is placed during the procedure, it usually is removed the next day.  You may have some mild neck pain.  Your voice may be weak. This usually is temporary. Document Released: 09/10/2000 Document Revised: 07/12/2012 Document Reviewed: 06/19/2010 [facility] Patient Information 2014 [place], [place]. PATIENT INSTRUCTIONS POST-ANESTHESIA  IMMEDIATELY FOLLOWING SURGERY:  Do not drive or operate machinery for the first twenty four hours after surgery.  Do not make  any important decisions for twenty four hours after surgery or while taking narcotic pain medications or sedatives.  If you develop intractable nausea and vomiting or a severe headache please notify your doctor immediately.  FOLLOW-UP:  Please make an appointment with your surgeon as instructed. You do not need to follow up with anesthesia unless specifically instructed to do so.  WOUND CARE INSTRUCTIONS (if applicable):  Keep a dry clean dressing on the anesthesia/puncture wound site if there is drainage.  Once the wound has quit draining you may leave it open to air.  Generally you should leave the bandage intact for twenty four hours unless there is drainage.  If the epidural  site drains for more than 36-48 hours please call the anesthesia department.  QUESTIONS?:  Please feel free to call your physician or the hospital operator if you have any questions, and they will be happy to assist you.

## 2013-06-01 NOTE — H&P (Signed)
  NTS SOAP Note  Vital Signs:  Vitals as of: 05/31/2013: Systolic 114: Diastolic 67: Heart Rate 96: Temp 97.69F: Height 565ft 2in: Weight 152Lbs 0 Ounces: BMI 27.8  BMI : 27.8 kg/m2  Subjective: This 58 Years 560 Months old Female presents for an enlarged thryoid.  Has been present for some time.  Previous biopsy results negative for malignancy.  Has been told to have surgery in past, but has refused.  States she is having more dysphagia.  No family h/o thyroid cancer,  neck irradiation, weight change, voice changes.  No airway compromise.   Review of Symptoms:  Constitutional:unremarkable   Head:unremarkable    Eyes:unremarkable   Nose/Mouth/Throat:neck mass ,neck pain  Cardiovascular:  unremarkable   Respiratory:  dyspnea,cough Gastrointestinal:  unremarkable   Genitourinary:    frequency   joint and back pain Skin:unremarkable Hematolgic/Lymphatic:unremarkable     Allergic/Immunologic:unremarkable     Past Medical History:    Reviewed  Past Medical History  Surgical History: knee surgery Medical Problems: NIDDM, HTN, mulinodular goiter, high cholesterol, hypothyroidism Allergies: prednisone, flonase Medications: invokanna, levothyroxine, celebrex, nexium, atorvastatin, trazadone, metformin, quinapril, alprazolam, fenofibrate   Social History:Reviewed  Social History  Preferred Language: English Race:  White Ethnicity: Not Hispanic / Latino Age: 58 Years 0 Months Marital Status:  S Alcohol: no Recreational drug(s): no   Smoking Status: Current every day smoker reviewed on 05/31/2013 Started Date: 04/01/1975 Packs per day: 1.00 Functional Status reviewed on 05/31/2013 ------------------------------------------------ Bathing: Normal Cooking: Normal Dressing: Normal Driving: Normal Eating: Normal Managing Meds: Normal Oral Care: Normal Shopping: Normal Toileting: Normal Transferring: Normal Walking: Normal Cognitive  Status reviewed on 05/31/2013 ------------------------------------------------ Attention: Normal Decision Making: Normal Language: Normal Memory: Normal Motor: Normal Perception: Normal Problem Solving: Normal Visual and Spatial: Normal   Family History:  Reviewed  Family Health History Mother, Deceased; History Unknown Father, Deceased; Lung cancer;     Objective Information: General:  Well appearing, well nourished in no distress.   Significantly enlarged thyroid gland, left >> right with nodularity noted.  No tracheal deviation. Heart:  RRR, no murmur Lungs:    CTA bilaterally, no wheezes, rhonchi, rales.  Breathing unlabored.  Assessment:Multinodular goiter  Diagnoses: 240.9 Diffuse goiter (Nontoxic diffuse goiter)  Procedures: [id number] - OFFICE OUTPATIENT NEW 30 MINUTES    Plan:  Scheduled for total thyroidectomy on 06/06/13.   Patient Education:Alternative treatments to surgery were discussed with patient (and family).  Risks and benefits  of procedure including bleeding, infection, nerve injury, and voice changes were fully explained to the patient (and family) who gave informed consent. Patient/family questions were addressed.  Follow-up:Pending Surgery

## 2013-06-02 ENCOUNTER — Encounter (HOSPITAL_COMMUNITY): Payer: Self-pay

## 2013-06-02 ENCOUNTER — Other Ambulatory Visit: Payer: Self-pay

## 2013-06-02 ENCOUNTER — Encounter (HOSPITAL_COMMUNITY)
Admission: RE | Admit: 2013-06-02 | Discharge: 2013-06-02 | Disposition: A | Discharge status: Home / Self Care | Payer: Medicare Other | Source: Ambulatory Visit | Attending: General Surgery | Admitting: General Surgery

## 2013-06-02 DIAGNOSIS — Z01810 Encounter for preprocedural cardiovascular examination: Secondary | ICD-10-CM | POA: Insufficient documentation

## 2013-06-02 DIAGNOSIS — Z01812 Encounter for preprocedural laboratory examination: Secondary | ICD-10-CM | POA: Insufficient documentation

## 2013-06-02 HISTORY — DX: Nontoxic goiter, unspecified: E04.9

## 2013-06-02 LAB — CBC WITH DIFFERENTIAL/PLATELET
BASOS ABS: 0.1 10*3/uL (ref 0.0–0.1)
Basophils Relative: 1 % (ref 0–1)
EOS ABS: 0.2 10*3/uL (ref 0.0–0.7)
EOS PCT: 2 % (ref 0–5)
HEMATOCRIT: 38.1 % (ref 36.0–46.0)
Hemoglobin: 12.7 g/dL (ref 12.0–15.0)
LYMPHS ABS: 2 10*3/uL (ref 0.7–4.0)
LYMPHS PCT: 27 % (ref 12–46)
MCH: 28.1 pg (ref 26.0–34.0)
MCHC: 33.3 g/dL (ref 30.0–36.0)
MCV: 84.3 fL (ref 78.0–100.0)
MONO ABS: 0.4 10*3/uL (ref 0.1–1.0)
Monocytes Relative: 5 % (ref 3–12)
Neutro Abs: 4.8 10*3/uL (ref 1.7–7.7)
Neutrophils Relative %: 66 % (ref 43–77)
PLATELETS: 341 10*3/uL (ref 150–400)
RBC: 4.52 MIL/uL (ref 3.87–5.11)
RDW: 14.7 % (ref 11.5–15.5)
WBC: 7.4 10*3/uL (ref 4.0–10.5)

## 2013-06-02 LAB — COMPREHENSIVE METABOLIC PANEL
ALT: 21 U/L (ref 0–35)
AST: 17 U/L (ref 0–37)
Albumin: 3.8 g/dL (ref 3.5–5.2)
Alkaline Phosphatase: 47 U/L (ref 39–117)
BUN: 26 mg/dL — ABNORMAL HIGH (ref 6–23)
CALCIUM: 10.7 mg/dL — AB (ref 8.4–10.5)
CO2: 22 meq/L (ref 19–32)
CREATININE: 0.94 mg/dL (ref 0.50–1.10)
Chloride: 100 mEq/L (ref 96–112)
GFR, EST AFRICAN AMERICAN: 76 mL/min — AB (ref >90)
GFR, EST NON AFRICAN AMERICAN: 66 mL/min — AB (ref >90)
GLUCOSE: 219 mg/dL — AB (ref 70–99)
Potassium: 4.7 mEq/L (ref 3.7–5.3)
Sodium: 137 mEq/L (ref 137–147)
TOTAL PROTEIN: 7.2 g/dL (ref 6.0–8.3)
Total Bilirubin: 0.2 mg/dL — ABNORMAL LOW (ref 0.3–1.2)

## 2013-06-02 NOTE — Pre-Procedure Instructions (Signed)
Information given to patient to set this up at home.

## 2013-06-06 ENCOUNTER — Ambulatory Visit (HOSPITAL_COMMUNITY): Payer: Medicare Other | Admitting: Anesthesiology

## 2013-06-06 ENCOUNTER — Encounter (HOSPITAL_COMMUNITY): Payer: Self-pay | Admitting: *Deleted

## 2013-06-06 ENCOUNTER — Encounter (HOSPITAL_COMMUNITY): Payer: Medicare Other | Admitting: Anesthesiology

## 2013-06-06 ENCOUNTER — Observation Stay (HOSPITAL_COMMUNITY)
Admission: RE | Admit: 2013-06-06 | Discharge: 2013-06-07 | Disposition: A | Discharge status: Home / Self Care | Payer: Medicare Other | Source: Ambulatory Visit | Attending: General Surgery | Admitting: General Surgery

## 2013-06-06 ENCOUNTER — Encounter (HOSPITAL_COMMUNITY)
Admission: RE | Disposition: A | Discharge status: Home / Self Care | Payer: Self-pay | Source: Ambulatory Visit | Attending: General Surgery

## 2013-06-06 DIAGNOSIS — E039 Hypothyroidism, unspecified: Secondary | ICD-10-CM | POA: Insufficient documentation

## 2013-06-06 DIAGNOSIS — F3289 Other specified depressive episodes: Secondary | ICD-10-CM | POA: Insufficient documentation

## 2013-06-06 DIAGNOSIS — E119 Type 2 diabetes mellitus without complications: Secondary | ICD-10-CM | POA: Insufficient documentation

## 2013-06-06 DIAGNOSIS — E7800 Pure hypercholesterolemia, unspecified: Secondary | ICD-10-CM | POA: Insufficient documentation

## 2013-06-06 DIAGNOSIS — R51 Headache: Secondary | ICD-10-CM | POA: Insufficient documentation

## 2013-06-06 DIAGNOSIS — E042 Nontoxic multinodular goiter: Principal | ICD-10-CM | POA: Diagnosis present

## 2013-06-06 DIAGNOSIS — K219 Gastro-esophageal reflux disease without esophagitis: Secondary | ICD-10-CM | POA: Insufficient documentation

## 2013-06-06 DIAGNOSIS — IMO0001 Reserved for inherently not codable concepts without codable children: Secondary | ICD-10-CM | POA: Insufficient documentation

## 2013-06-06 DIAGNOSIS — I10 Essential (primary) hypertension: Secondary | ICD-10-CM | POA: Insufficient documentation

## 2013-06-06 DIAGNOSIS — F17200 Nicotine dependence, unspecified, uncomplicated: Secondary | ICD-10-CM | POA: Insufficient documentation

## 2013-06-06 DIAGNOSIS — F329 Major depressive disorder, single episode, unspecified: Secondary | ICD-10-CM | POA: Insufficient documentation

## 2013-06-06 HISTORY — PX: THYROIDECTOMY: SHX17

## 2013-06-06 LAB — COMPREHENSIVE METABOLIC PANEL
ALT: 17 U/L (ref 0–35)
AST: 17 U/L (ref 0–37)
Albumin: 3 g/dL — ABNORMAL LOW (ref 3.5–5.2)
Alkaline Phosphatase: 34 U/L — ABNORMAL LOW (ref 39–117)
BUN: 28 mg/dL — AB (ref 6–23)
CALCIUM: 8.4 mg/dL (ref 8.4–10.5)
CO2: 25 meq/L (ref 19–32)
CREATININE: 0.97 mg/dL (ref 0.50–1.10)
Chloride: 107 mEq/L (ref 96–112)
GFR, EST AFRICAN AMERICAN: 73 mL/min — AB (ref >90)
GFR, EST NON AFRICAN AMERICAN: 63 mL/min — AB (ref >90)
GLUCOSE: 122 mg/dL — AB (ref 70–99)
Potassium: 4.9 mEq/L (ref 3.7–5.3)
Sodium: 141 mEq/L (ref 137–147)
Total Bilirubin: 0.2 mg/dL — ABNORMAL LOW (ref 0.3–1.2)
Total Protein: 5.8 g/dL — ABNORMAL LOW (ref 6.0–8.3)

## 2013-06-06 LAB — GLUCOSE, CAPILLARY
GLUCOSE-CAPILLARY: 184 mg/dL — AB (ref 70–99)
GLUCOSE-CAPILLARY: 172 mg/dL — AB (ref 70–99)
GLUCOSE-CAPILLARY: 168 mg/dL — AB (ref 70–99)
GLUCOSE-CAPILLARY: 245 mg/dL — AB (ref 70–99)
GLUCOSE-CAPILLARY: 214 mg/dL — AB (ref 70–99)

## 2013-06-06 LAB — TYPE AND SCREEN
ABO/RH(D): O POS
ANTIBODY SCREEN: NEGATIVE

## 2013-06-06 SURGERY — THYROIDECTOMY
Anesthesia: General

## 2013-06-06 MED ORDER — SUCCINYLCHOLINE CHLORIDE 20 MG/ML IJ SOLN
INTRAMUSCULAR | Status: DC | PRN
  Administered 2013-06-06: 120 mg via INTRAVENOUS

## 2013-06-06 MED ORDER — CALCIUM CARBONATE 1250 (500 CA) MG PO TABS
1.0000 | ORAL_TABLET | Freq: Every day | ORAL | Status: DC
  Administered 2013-06-07: 500 mg via ORAL

## 2013-06-06 MED ORDER — GLYCOPYRROLATE 0.2 MG/ML IJ SOLN
INTRAMUSCULAR | Status: DC | PRN
  Administered 2013-06-06: .6 mg via INTRAVENOUS

## 2013-06-06 MED ORDER — ROCURONIUM BROMIDE 100 MG/10ML IV SOLN
INTRAVENOUS | Status: DC | PRN
  Administered 2013-06-06 (×2): 10 mg via INTRAVENOUS
  Administered 2013-06-06: 25 mg via INTRAVENOUS
  Administered 2013-06-06: 5 mg via INTRAVENOUS
  Administered 2013-06-06: 10 mg via INTRAVENOUS

## 2013-06-06 MED ORDER — HYDROCODONE-ACETAMINOPHEN 5-325 MG PO TABS: 1.0000 | ORAL_TABLET | ORAL | Status: DC | PRN

## 2013-06-06 MED ORDER — ROCURONIUM BROMIDE 50 MG/5ML IV SOLN
INTRAVENOUS | Status: AC
  Filled 2013-06-06: qty 1

## 2013-06-06 MED ORDER — PROPOFOL 10 MG/ML IV BOLUS
INTRAVENOUS | Status: DC | PRN
  Administered 2013-06-06: 140 mg via INTRAVENOUS

## 2013-06-06 MED ORDER — ALPRAZOLAM 1 MG PO TABS: 1.0000 mg | ORAL_TABLET | Freq: Three times a day (TID) | ORAL | Status: DC | PRN

## 2013-06-06 MED ORDER — METFORMIN HCL 500 MG PO TABS
500.0000 mg | ORAL_TABLET | Freq: Two times a day (BID) | ORAL | Status: DC
  Administered 2013-06-06 – 2013-06-07 (×3): 500 mg via ORAL
  Filled 2013-06-06: qty 1

## 2013-06-06 MED ORDER — LISINOPRIL 10 MG PO TABS
10.0000 mg | ORAL_TABLET | Freq: Every day | ORAL | Status: DC
Start: 2013-06-06 — End: 2013-06-07
  Administered 2013-06-07: 10 mg via ORAL

## 2013-06-06 MED ORDER — FENTANYL CITRATE 0.05 MG/ML IJ SOLN: 25.0000 ug | INTRAMUSCULAR | Status: DC | PRN

## 2013-06-06 MED ORDER — GLYCOPYRROLATE 0.2 MG/ML IJ SOLN
INTRAMUSCULAR | Status: AC
  Filled 2013-06-06: qty 1

## 2013-06-06 MED ORDER — PROPOFOL 10 MG/ML IV EMUL
INTRAVENOUS | Status: AC
  Filled 2013-06-06: qty 20

## 2013-06-06 MED ORDER — SUCCINYLCHOLINE CHLORIDE 20 MG/ML IJ SOLN
INTRAMUSCULAR | Status: AC
  Filled 2013-06-06: qty 1

## 2013-06-06 MED ORDER — NEOSTIGMINE METHYLSULFATE 1 MG/ML IJ SOLN
INTRAMUSCULAR | Status: DC | PRN
  Administered 2013-06-06: 3 mg via INTRAVENOUS

## 2013-06-06 MED ORDER — LACTATED RINGERS IV SOLN
INTRAVENOUS | Status: DC
  Administered 2013-06-06 (×2): via INTRAVENOUS

## 2013-06-06 MED ORDER — ESCITALOPRAM OXALATE 10 MG PO TABS
20.0000 mg | ORAL_TABLET | Freq: Every day | ORAL | Status: DC
  Administered 2013-06-07: 20 mg via ORAL

## 2013-06-06 MED ORDER — BUPIVACAINE HCL (PF) 0.5 % IJ SOLN
INTRAMUSCULAR | Status: DC | PRN
  Administered 2013-06-06: 7 mL

## 2013-06-06 MED ORDER — SODIUM CHLORIDE 0.9 % IR SOLN
Status: DC | PRN
  Administered 2013-06-06: 1000 mL

## 2013-06-06 MED ORDER — ONDANSETRON HCL 4 MG/2ML IJ SOLN: 4.0000 mg | Freq: Once | INTRAMUSCULAR | Status: DC | PRN

## 2013-06-06 MED ORDER — ONDANSETRON HCL 4 MG PO TABS: 4.0000 mg | ORAL_TABLET | Freq: Four times a day (QID) | ORAL | Status: DC | PRN

## 2013-06-06 MED ORDER — GLYCOPYRROLATE 0.2 MG/ML IJ SOLN
INTRAMUSCULAR | Status: AC
  Filled 2013-06-06: qty 3

## 2013-06-06 MED ORDER — DULOXETINE HCL 30 MG PO CPEP
30.0000 mg | ORAL_CAPSULE | Freq: Every day | ORAL | Status: DC
  Administered 2013-06-07: 30 mg via ORAL

## 2013-06-06 MED ORDER — LEVOTHYROXINE SODIUM 75 MCG PO TABS
75.0000 ug | ORAL_TABLET | Freq: Every day | ORAL | Status: DC
  Administered 2013-06-07: 75 ug via ORAL

## 2013-06-06 MED ORDER — FENTANYL CITRATE 0.05 MG/ML IJ SOLN
INTRAMUSCULAR | Status: AC
  Filled 2013-06-06: qty 5

## 2013-06-06 MED ORDER — MIDAZOLAM HCL 2 MG/2ML IJ SOLN
INTRAMUSCULAR | Status: AC
  Filled 2013-06-06: qty 2

## 2013-06-06 MED ORDER — LIDOCAINE HCL (PF) 1 % IJ SOLN
INTRAMUSCULAR | Status: AC
  Filled 2013-06-06: qty 5

## 2013-06-06 MED ORDER — MIDAZOLAM HCL 2 MG/2ML IJ SOLN
1.0000 mg | INTRAMUSCULAR | Status: DC | PRN
Start: 2013-06-06 — End: 2013-06-06
  Administered 2013-06-06: 2 mg via INTRAVENOUS

## 2013-06-06 MED ORDER — FENOFIBRATE 160 MG PO TABS
160.0000 mg | ORAL_TABLET | Freq: Every day | ORAL | Status: DC
  Administered 2013-06-07: 160 mg via ORAL
  Filled 2013-06-06 (×3): qty 1

## 2013-06-06 MED ORDER — HEMOSTATIC AGENTS (NO CHARGE) OPTIME
TOPICAL | Status: DC | PRN
  Administered 2013-06-06: 1 via TOPICAL

## 2013-06-06 MED ORDER — GLYCOPYRROLATE 0.2 MG/ML IJ SOLN
0.2000 mg | Freq: Once | INTRAMUSCULAR | Status: AC
  Administered 2013-06-06: 0.2 mg via INTRAVENOUS

## 2013-06-06 MED ORDER — INSULIN ASPART 100 UNIT/ML ~~LOC~~ SOLN
0.0000 [IU] | Freq: Three times a day (TID) | SUBCUTANEOUS | Status: DC
  Administered 2013-06-06: 5 [IU] via SUBCUTANEOUS
  Administered 2013-06-06 – 2013-06-07 (×2): 3 [IU] via SUBCUTANEOUS
  Administered 2013-06-07: 8 [IU] via SUBCUTANEOUS

## 2013-06-06 MED ORDER — TRAZODONE HCL 50 MG PO TABS
100.0000 mg | ORAL_TABLET | Freq: Every day | ORAL | Status: DC
  Administered 2013-06-06: 100 mg via ORAL
  Filled 2013-06-06: qty 2

## 2013-06-06 MED ORDER — ONDANSETRON HCL 4 MG/2ML IJ SOLN: 4.0000 mg | Freq: Four times a day (QID) | INTRAMUSCULAR | Status: DC | PRN

## 2013-06-06 MED ORDER — CHLORHEXIDINE GLUCONATE 4 % EX LIQD: 1.0000 "application " | Freq: Once | CUTANEOUS | Status: AC

## 2013-06-06 MED ORDER — MENTHOL 3 MG MT LOZG
1.0000 | LOZENGE | OROMUCOSAL | Status: DC | PRN
  Filled 2013-06-06: qty 9

## 2013-06-06 MED ORDER — ONDANSETRON HCL 4 MG/2ML IJ SOLN
4.0000 mg | Freq: Once | INTRAMUSCULAR | Status: AC
  Administered 2013-06-06: 4 mg via INTRAVENOUS

## 2013-06-06 MED ORDER — KETOROLAC TROMETHAMINE 30 MG/ML IJ SOLN
30.0000 mg | Freq: Once | INTRAMUSCULAR | Status: AC
  Administered 2013-06-06: 30 mg via INTRAVENOUS
  Filled 2013-06-06: qty 1

## 2013-06-06 MED ORDER — CANAGLIFLOZIN 100 MG PO TABS
300.0000 mg | ORAL_TABLET | Freq: Every day | ORAL | Status: DC
  Administered 2013-06-07: 300 mg via ORAL
  Filled 2013-06-06 (×3): qty 3

## 2013-06-06 MED ORDER — LIDOCAINE HCL 1 % IJ SOLN
INTRAMUSCULAR | Status: DC | PRN
  Administered 2013-06-06: 40 mg via INTRADERMAL

## 2013-06-06 MED ORDER — ONDANSETRON HCL 4 MG/2ML IJ SOLN
INTRAMUSCULAR | Status: AC
  Filled 2013-06-06: qty 2

## 2013-06-06 MED ORDER — HYDROMORPHONE HCL PF 1 MG/ML IJ SOLN
1.0000 mg | INTRAMUSCULAR | Status: DC | PRN
  Administered 2013-06-06 – 2013-06-07 (×3): 1 mg via INTRAVENOUS
  Filled 2013-06-06 (×3): qty 1

## 2013-06-06 MED ORDER — FENTANYL CITRATE 0.05 MG/ML IJ SOLN
INTRAMUSCULAR | Status: DC | PRN
  Administered 2013-06-06 (×9): 50 ug via INTRAVENOUS

## 2013-06-06 MED ORDER — LACTATED RINGERS IV SOLN
INTRAVENOUS | Status: DC
  Administered 2013-06-06: 21:00:00 via INTRAVENOUS

## 2013-06-06 MED ORDER — BUPIVACAINE HCL (PF) 0.5 % IJ SOLN
INTRAMUSCULAR | Status: AC
  Filled 2013-06-06: qty 30

## 2013-06-06 SURGICAL SUPPLY — 66 items
ADH SKN CLS APL DERMABOND .7 (GAUZE/BANDAGES/DRESSINGS) ×1
APPLIER CLIP 11 MED OPEN (CLIP) ×3
APPLIER CLIP 9.375 SM OPEN (CLIP) ×9
APR CLP MED 11 20 MLT OPN (CLIP) ×1
APR CLP SM 9.3 20 MLT OPN (CLIP) ×3
ATTRACTOMAT 16X20 MAGNETIC DRP (DRAPES) ×3 IMPLANT
BAG HAMPER (MISCELLANEOUS) ×3 IMPLANT
BLADE SURG 15 STRL LF DISP TIS (BLADE) ×1 IMPLANT
BLADE SURG 15 STRL SS (BLADE) ×3
BLADE SURG SZ10 CARB STEEL (BLADE) ×3 IMPLANT
CLIP APPLIE 11 MED OPEN (CLIP) ×1 IMPLANT
CLIP APPLIE 9.375 SM OPEN (CLIP) ×1 IMPLANT
CLOTH BEACON ORANGE TIMEOUT ST (SAFETY) ×3 IMPLANT
COVER LIGHT HANDLE STERIS (MISCELLANEOUS) ×6 IMPLANT
DERMABOND ADVANCED (GAUZE/BANDAGES/DRESSINGS) ×2
DERMABOND ADVANCED .7 DNX12 (GAUZE/BANDAGES/DRESSINGS) ×1 IMPLANT
DRAPE PED LAPAROTOMY (DRAPES) ×3 IMPLANT
DRAPE PROXIMA HALF (DRAPES) ×6 IMPLANT
DURAPREP 6ML APPLICATOR 50/CS (WOUND CARE) ×3 IMPLANT
ELECT NDL TIP 2.8 STRL (NEEDLE) ×1 IMPLANT
ELECT NEEDLE TIP 2.8 STRL (NEEDLE) ×3 IMPLANT
ELECT REM PT RETURN 9FT ADLT (ELECTROSURGICAL) ×3
ELECTRODE REM PT RTRN 9FT ADLT (ELECTROSURGICAL) ×1 IMPLANT
FORMALIN 10 PREFIL 120ML (MISCELLANEOUS) ×6 IMPLANT
GAUZE SPONGE 4X4 16PLY XRAY LF (GAUZE/BANDAGES/DRESSINGS) ×6 IMPLANT
GLOVE BIO SURGEON STRL SZ7.5 (GLOVE) ×3 IMPLANT
GLOVE BIOGEL PI IND STRL 7.0 (GLOVE) IMPLANT
GLOVE BIOGEL PI IND STRL 7.5 (GLOVE) IMPLANT
GLOVE BIOGEL PI INDICATOR 7.0 (GLOVE) ×4
GLOVE BIOGEL PI INDICATOR 7.5 (GLOVE) ×2
GLOVE ECLIPSE 6.5 STRL STRAW (GLOVE) ×2 IMPLANT
GLOVE SS BIOGEL STRL SZ 6.5 (GLOVE) IMPLANT
GLOVE SUPERSENSE BIOGEL SZ 6.5 (GLOVE) ×4
GOWN STRL REUS W/TWL LRG LVL3 (GOWN DISPOSABLE) ×9 IMPLANT
HEMOSTAT SURGICEL 2X3 (HEMOSTASIS) ×1 IMPLANT
HEMOSTAT SURGICEL 4X8 (HEMOSTASIS) ×2 IMPLANT
KIT BLADEGUARD II DBL (SET/KITS/TRAYS/PACK) ×3 IMPLANT
KIT ROOM TURNOVER APOR (KITS) ×3 IMPLANT
MANIFOLD NEPTUNE II (INSTRUMENTS) ×3 IMPLANT
MARKER SKIN DUAL TIP RULER LAB (MISCELLANEOUS) ×3 IMPLANT
NDL HYPO 25X1 1.5 SAFETY (NEEDLE) ×1 IMPLANT
NEEDLE HYPO 25X1 1.5 SAFETY (NEEDLE) ×3 IMPLANT
NS IRRIG 1000ML POUR BTL (IV SOLUTION) ×3 IMPLANT
PACK BASIC III (CUSTOM PROCEDURE TRAY) ×3
PACK SRG BSC III STRL LF ECLPS (CUSTOM PROCEDURE TRAY) ×1 IMPLANT
PAD ARMBOARD 7.5X6 YLW CONV (MISCELLANEOUS) ×3 IMPLANT
PENCIL HANDSWITCHING (ELECTRODE) ×3 IMPLANT
SET BASIN LINEN APH (SET/KITS/TRAYS/PACK) ×3 IMPLANT
SHEARS HARMONIC 9CM CVD (BLADE) IMPLANT
SPONGE INTESTINAL PEANUT (DISPOSABLE) ×15 IMPLANT
STAPLER VISISTAT (STAPLE) ×3 IMPLANT
SUT ETHILON 3 0 FSL (SUTURE) ×3 IMPLANT
SUT ETHILON 4 0 PS 2 18 (SUTURE) ×3 IMPLANT
SUT SILK 2 0 (SUTURE) ×3
SUT SILK 2-0 18XBRD TIE 12 (SUTURE) ×1 IMPLANT
SUT SILK 3 0 (SUTURE)
SUT SILK 3-0 18XBRD TIE 12 (SUTURE) IMPLANT
SUT VIC AB 2-0 CT2 27 (SUTURE) ×3 IMPLANT
SUT VIC AB 3-0 SH 27 (SUTURE) ×3
SUT VIC AB 3-0 SH 27X BRD (SUTURE) ×1 IMPLANT
SUT VIC AB 4-0 PS2 27 (SUTURE) ×4 IMPLANT
SYR BULB IRRIGATION 50ML (SYRINGE) ×2 IMPLANT
SYR CONTROL 10ML LL (SYRINGE) ×3 IMPLANT
SYSTEM CHEST DRAIN TLS 7FR (DRAIN) ×3 IMPLANT
TOWEL OR 17X26 4PK STRL BLUE (TOWEL DISPOSABLE) IMPLANT
YANKAUER SUCT BULB TIP 10FT TU (MISCELLANEOUS) ×3 IMPLANT

## 2013-06-06 NOTE — Anesthesia Preprocedure Evaluation (Addendum)
Anesthesia Evaluation  Patient identified by MRN, date of birth, ID band Patient awake    Reviewed: Allergy & Precautions, H&P , NPO status , Patient's Chart, lab work & pertinent test results  Airway Mallampati: I TM Distance: >3 FB Neck ROM: Full    Dental  (+) Teeth Intact, Poor Dentition, Dental Advisory Given   Pulmonary Current Smoker (am cough, sinus issues),  breath sounds clear to auscultation        Cardiovascular negative cardio ROS  Rhythm:Regular Rate:Normal     Neuro/Psych  Headaches, PSYCHIATRIC DISORDERS Depression  Neuromuscular disease    GI/Hepatic GERD-  Controlled and Medicated,  Endo/Other  diabetes, Type 2, Oral Hypoglycemic Agents  Renal/GU      Musculoskeletal  (+) Fibromyalgia -  Abdominal   Peds  Hematology   Anesthesia Other Findings   Reproductive/Obstetrics                          Anesthesia Physical Anesthesia Plan  ASA: III  Anesthesia Plan: General   Post-op Pain Management:    Induction: Intravenous, Cricoid pressure planned and Rapid sequence  Airway Management Planned: Oral ETT  Additional Equipment:   Intra-op Plan:   Post-operative Plan: Extubation in OR  Informed Consent: I have reviewed the patients History and Physical, chart, labs and discussed the procedure including the risks, benefits and alternatives for the proposed anesthesia with the patient or authorized representative who has indicated his/her understanding and acceptance.     Plan Discussed with:   Anesthesia Plan Comments:         Anesthesia Quick Evaluation

## 2013-06-06 NOTE — Op Note (Signed)
Patient:  Linda Riggs  DOB:  01/02/1956  MRN:  [id number]   Preop Diagnosis:  Multinodular goiter  Postop Diagnosis:  Same  Procedure:  Total thyroidectomy  Surgeon:  Franky MachoMark Emmalyn Hinson, M.D.  Anes:  General endotracheal  Indications:  Patient is a 58 year old white female who presents with a multinodular goiter which is increasing in size. She's had fine-needle aspirations in the past which have been negative for malignancy. She is starting to have dysphasia. The risks and benefits of the procedure including bleeding, infection, nerve injury, voice changes, and the possibility of malignancy were fully explained to the patient, who gave informed consent.  Procedure note:  The patient is placed the supine position. After induction of general endotracheal anesthesia, the head and neck were extended safely. The neck was prepped and draped using usual sterile technique with DuraPrep. Surgical site confirmation was performed.  A transverse incision was made above the jugular notch. The dissection was taken down through the platysma to the strap muscles. The strap muscles were then divided along its midline and retracted bluntly and laterally. I first turned my attention to the left lobe of the thyroid gland. He was very large with multiple nodules present. The middle thyroidal artery and vein were identified and ligated using small clips. This was likewise done to the suspensory ligament of Gery PrayBarry as well as the inferior thyroidal artery and vein. Care was taken to avoid both the superior and inferior parathyroid glands. The recurrent laryngeal nerve was never exposed. The lobe was then mobilized from its lateral attachments over to the trachea. Next, the identical procedure was done to the right lobe of the thyroid gland which was nodular, but smaller. Again, care was taken to avoid the recurrent laryngeal nerve. The thyroid gland was then freed away from the trachea and sent to pathology further  examination. A single suture was placed in the left lobe for orientation purposes. Thyroidal bed were inspected and no abnormal bleeding was noted. Surgicel was placed in both thyroidal bed. A #5 round drain was then placed into the thyroidal bed and brought out through a separate stab wound inferior to the incision line. It was secured at the skin level using 3-0 nylon interrupted suture. The strap muscles were reapproximated along the midline using a 2-0 Vicryl running suture. This platysma was reapproximated using 3-0 Vicryl running suture. The skin was closed using 4-0 Vicryl subcuticular suture. 0.5% Sensorcaine was instilled the surrounding wound. Dermabond was then applied.  All tape and needle counts were correct at the end of the procedure. Patient was able to phonate E without difficulty. The patient was extubated in the operating room and transferred to PACU in stable condition.  Complications:  None  EBL:  275 cc  Specimen:  Total thyroid gland, suture left lobe  Drains: Round drain to thyroid bed

## 2013-06-06 NOTE — Anesthesia Postprocedure Evaluation (Signed)
  Anesthesia Post-op Note  Patient: Linda Riggs Linda Riggs  Procedure(s) Performed: Procedure(s): TOTAL THYROIDECTOMY (N/A)  Patient Location: PACU  Anesthesia Type:General  Level of Consciousness: awake  Airway and Oxygen Therapy: Patient Spontanous Breathing and Patient connected to face mask oxygen  Post-op Pain: none  Post-op Assessment: Post-op Vital signs reviewed, Patient's Cardiovascular Status Stable, Respiratory Function Stable, Patent Airway and No signs of Nausea or vomiting  Post-op Vital Signs: Reviewed and stable  Complications: No apparent anesthesia complications

## 2013-06-06 NOTE — Transfer of Care (Signed)
Immediate Anesthesia Transfer of Care Note  Patient: Linda Riggs  Procedure(s) Performed: Procedure(s): TOTAL THYROIDECTOMY (N/A)  Patient Location: PACU  Anesthesia Type:General  Level of Consciousness: sedated  Airway & Oxygen Therapy: Patient Spontanous Breathing and Patient connected to face mask oxygen  Post-op Assessment: Report given to PACU RN  Post vital signs: Reviewed  Complications: No apparent anesthesia complications

## 2013-06-06 NOTE — Interval H&P Note (Signed)
History and Physical Interval Note:  06/06/2013 7:20 AM  Linda Riggs  has presented today for surgery, with the diagnosis of multi-nodular goiter  The various methods of treatment have been discussed with the patient and family. After consideration of risks, benefits and other options for treatment, the patient has consented to  Procedure(s): TOTAL THYROIDECTOMY (N/A) as a surgical intervention .  The patient's history has been reviewed, patient examined, no change in status, stable for surgery.  I have reviewed the patient's chart and labs.  Questions were answered to the patient's satisfaction.     Franky MachoJENKINS,Annaliese Saez A

## 2013-06-06 NOTE — Anesthesia Procedure Notes (Signed)
Procedure Name: Intubation Date/Time: 06/06/2013 7:37 AM Performed by: Glynn OctaveANIEL, Dot Splinter E Pre-anesthesia Checklist: Patient identified, Patient being monitored, Timeout performed, Emergency Drugs available and Suction available Patient Re-evaluated:Patient Re-evaluated prior to inductionOxygen Delivery Method: Circle System Utilized Preoxygenation: Pre-oxygenation with 100% oxygen Intubation Type: IV induction, Rapid sequence and Cricoid Pressure applied Ventilation: Mask ventilation without difficulty Laryngoscope Size: Mac and 3 Grade View: Grade I Tube type: Oral Tube size: 7.0 mm Number of attempts: 1 Airway Equipment and Method: stylet Placement Confirmation: ETT inserted through vocal cords under direct vision,  positive ETCO2 and breath sounds checked- equal and bilateral Secured at: 21 cm Tube secured with: Tape Dental Injury: Teeth and Oropharynx as per pre-operative assessment

## 2013-06-07 ENCOUNTER — Encounter (HOSPITAL_COMMUNITY): Payer: Self-pay | Admitting: General Surgery

## 2013-06-07 LAB — CBC
HCT: 29.1 % — ABNORMAL LOW (ref 36.0–46.0)
Hemoglobin: 9.6 g/dL — ABNORMAL LOW (ref 12.0–15.0)
MCH: 28.2 pg (ref 26.0–34.0)
MCHC: 33 g/dL (ref 30.0–36.0)
MCV: 85.3 fL (ref 78.0–100.0)
PLATELETS: 235 10*3/uL (ref 150–400)
RBC: 3.41 MIL/uL — AB (ref 3.87–5.11)
RDW: 14.9 % (ref 11.5–15.5)
WBC: 9.2 10*3/uL (ref 4.0–10.5)

## 2013-06-07 LAB — COMPREHENSIVE METABOLIC PANEL
ALT: 16 U/L (ref 0–35)
AST: 15 U/L (ref 0–37)
Albumin: 3 g/dL — ABNORMAL LOW (ref 3.5–5.2)
Alkaline Phosphatase: 39 U/L (ref 39–117)
BUN: 24 mg/dL — ABNORMAL HIGH (ref 6–23)
CALCIUM: 8.4 mg/dL (ref 8.4–10.5)
CO2: 26 mEq/L (ref 19–32)
Chloride: 103 mEq/L (ref 96–112)
Creatinine, Ser: 0.86 mg/dL (ref 0.50–1.10)
GFR calc non Af Amer: 73 mL/min — ABNORMAL LOW (ref >90)
GFR, EST AFRICAN AMERICAN: 85 mL/min — AB (ref >90)
GLUCOSE: 184 mg/dL — AB (ref 70–99)
Potassium: 4.6 mEq/L (ref 3.7–5.3)
SODIUM: 140 meq/L (ref 137–147)
TOTAL PROTEIN: 5.9 g/dL — AB (ref 6.0–8.3)
Total Bilirubin: 0.2 mg/dL — ABNORMAL LOW (ref 0.3–1.2)

## 2013-06-07 LAB — HEMOGLOBIN A1C
Hgb A1c MFr Bld: 8.2 % — ABNORMAL HIGH (ref <5.7)
Mean Plasma Glucose: 189 mg/dL — ABNORMAL HIGH (ref <117)

## 2013-06-07 LAB — GLUCOSE, CAPILLARY
Glucose-Capillary: 267 mg/dL — ABNORMAL HIGH (ref 70–99)
Glucose-Capillary: 178 mg/dL — ABNORMAL HIGH (ref 70–99)
Glucose-Capillary: 48 mg/dL — ABNORMAL LOW (ref 70–99)

## 2013-06-07 LAB — PHOSPHORUS: Phosphorus: 4.8 mg/dL — ABNORMAL HIGH (ref 2.3–4.6)

## 2013-06-07 LAB — MAGNESIUM: Magnesium: 1.3 mg/dL — ABNORMAL LOW (ref 1.5–2.5)

## 2013-06-07 MED ORDER — OXYCODONE-ACETAMINOPHEN 7.5-325 MG PO TABS: 1.0000 | ORAL_TABLET | ORAL | Status: DC | PRN

## 2013-06-07 MED ORDER — MENTHOL 3 MG MT LOZG
1.0000 | LOZENGE | OROMUCOSAL | Status: DC | PRN
  Administered 2013-06-07: 3 mg via ORAL

## 2013-06-07 MED ORDER — FLEET ENEMA 7-19 GM/118ML RE ENEM
1.0000 | ENEMA | Freq: Every day | RECTAL | Status: DC | PRN
  Administered 2013-06-07: 1 via RECTAL

## 2013-06-07 NOTE — Discharge Instructions (Signed)
Thyroidectomy  Care After  Refer to this sheet in the next few weeks. These instructions provide you with general information on caring for yourself after you leave the hospital. Your caregiver also may give you specific instructions. Your treatment has been planned according to the most current medical practices available, but problems sometimes occur. Call your caregiver if you have any problems or questions after your procedure.  HOME CARE INSTRUCTIONS    It is normal to be sore for a few weeks following surgery. See your caregiver if your pain seems to be getting worse rather than better.   Only take over-the-counter or prescription medicines for pain, discomfort, or fever as directed by your caregiver. Avoid taking medicines that contain aspirin and ibuprofen because they increase the risk of bleeding.   Shower rather than bathe until instructed otherwise by your caregiver.   Change your bandages (dressings) as directed by your caregiver.   You may resume a normal diet and activities as directed by your caregiver.   Avoid lifting weight greater than 20 lb (9 kg) or participating in heavy exercise or contact sports for 10 days or as instructed by your caregiver.   Make an appointment to see your caregiver for stitch (suture) or staple removal.  SEEK MEDICAL CARE IF:    You have increased bleeding from your wound.   You have redness, swelling, or increasing pain from your wound or in your neck.   There is pus coming from your wound.   You have an oral temperature above 102 F (38.9 C).   There is a bad smell coming from the wound or dressing.   You develop lightheadedness or feel faint.   You develop numbness, tingling, or muscle spasms in your arms, hands, feet, or face.   You have difficulty swallowing.  SEEK IMMEDIATE MEDICAL CARE IF:    You develop a rash.   You have difficulty breathing.   You hear whistling noises that come from your chest.   You develop a cough that becomes increasingly  worse.   You develop any reaction or side effects to medicines given.   There is swelling in your neck.   You develop changes in speech or hoarseness, which is getting worse.  MAKE SURE YOU:    Understand these instructions.   Will watch your condition.   Will get help right away if you are not doing well or get worse.  Document Released: 10/04/2004 Document Revised: 06/09/2011 Document Reviewed: 05/24/2010  [employer] Patient Information 2014 [employer]

## 2013-06-07 NOTE — Progress Notes (Signed)
Pt discharged home today per Dr. Jenkins. Pt's IV site D/C'd and WNL. Pt's VSS. Pt provided with home medication list, discharge instructions and prescriptions. Verbalized understanding. Pt left floor via WC in stable condition accompanied by NT. 

## 2013-06-07 NOTE — Anesthesia Postprocedure Evaluation (Signed)
  Anesthesia Post-op Note  Patient: Linda Riggs  Procedure(s) Performed: Procedure(s): TOTAL THYROIDECTOMY (N/A)  Patient Location: Nursing Unit  Anesthesia Type:General  Level of Consciousness: awake, alert  and oriented  Airway and Oxygen Therapy: Patient Spontanous Breathing  Post-op Pain: mild  Post-op Assessment: Post-op Vital signs reviewed, Patient's Cardiovascular Status Stable, Respiratory Function Stable, Patent Airway and No signs of Nausea or vomiting  Post-op Vital Signs: Reviewed and stable  Complications: No apparent anesthesia complications

## 2013-06-07 NOTE — Addendum Note (Signed)
Addendum created 06/07/13 1052 by Moshe SalisburyKaren E Larina Lieurance, CRNA   Modules edited: Notes Section   Notes Section:  File: [id number]

## 2013-06-07 NOTE — Discharge Summary (Signed)
Physician Discharge Summary  Patient ID: Linda Riggs MRN: [id number] DOB/AGE: Aug 16, 1955 58 y.o.  Admit date: 06/06/2013 Discharge date: 06/07/2013  Admission Diagnoses: Multinodular goiter  Discharge Diagnoses: Same Active Problems:   Multinodular goiter   Discharged Condition: good  Hospital Course: Patient is a 58 year old white female with a history of multinodular goiter who presented to the operating room for total thyroidectomy. This was performed on 06/06/2013. She tolerated the procedure well. Postoperative course has been remarkable for mild dysphasia and constipation. Her voice has not changed significantly from preop. Her calcium levels have remained within normal limits. Final pathology is still pending. She is being discharged home in good improving condition.  Treatments: surgery: Total thyroidectomy on 06/06/2013  Discharge Exam: Blood pressure 140/61, pulse 90, temperature 98.2 F (36.8 C), temperature source Oral, resp. rate 18, height 5\' 2"  (1.575 m), weight 68.493 kg (151 lb), SpO2 95.00%. General appearance: alert, cooperative, fatigued and no distress Neck: Incision healing well. Mild ecchymosis noted. No significant hematoma present. JP drain removed. Resp: clear to auscultation bilaterally Cardio: regular rate and rhythm, S1, S2 normal, no murmur, click, rub or gallop  Disposition:  home     Medication List         alendronate 70 MG tablet  Commonly known as:  FOSAMAX  Take 70 mg by mouth every 7 (seven) days. Take with a full glass of water on an empty stomach.     ALPRAZolam 1 MG tablet  Commonly known as:  XANAX  Take 1 mg by mouth at bedtime as needed.     atorvastatin 80 MG tablet  Commonly known as:  LIPITOR  Take 80 mg by mouth daily.     CALCIUM 600 + MINERALS 600-200 MG-UNIT Tabs  Take 1 tablet by mouth daily.     celecoxib 200 MG capsule  Commonly known as:  CELEBREX  Take 200 mg by mouth 2 (two) times daily.     DULoxetine 30  MG capsule  Commonly known as:  CYMBALTA  Take 30 mg by mouth daily.     escitalopram 20 MG tablet  Commonly known as:  LEXAPRO  Take 20 mg by mouth daily.     esomeprazole 40 MG capsule  Commonly known as:  NEXIUM  Take 40 mg by mouth daily before breakfast.     fenofibrate 145 MG tablet  Commonly known as:  TRICOR  Take 145 mg by mouth daily.     ferrous fumarate 325 (106 FE) MG Tabs tablet  Commonly known as:  HEMOCYTE - 106 mg FE  Take 1 tablet by mouth 3 (three) times daily.     fexofenadine 180 MG tablet  Commonly known as:  ALLEGRA  Take 180 mg by mouth daily.     INVOKANA 300 MG Tabs  Generic drug:  Canagliflozin  Take 1 tablet by mouth daily.     levothyroxine 75 MCG tablet  Commonly known as:  SYNTHROID, LEVOTHROID  Take 75 mcg by mouth daily before breakfast.     metFORMIN 500 MG tablet  Commonly known as:  GLUCOPHAGE  Take 500 mg by mouth 2 (two) times daily with a meal.     oxyCODONE-acetaminophen 7.5-325 MG per tablet  Commonly known as:  PERCOCET  Take 1-2 tablets by mouth every 4 (four) hours as needed.     potassium gluconate 595 MG Tabs tablet  Take 595 mg by mouth daily.     quinapril 10 MG tablet  Commonly known as:  ACCUPRIL  Take 10 mg by mouth at bedtime.     traZODone 100 MG tablet  Commonly known as:  DESYREL  Take 100 mg by mouth at bedtime.           Follow-up Information   Follow up with Dalia HeadingJENKINS,Ryleigh Buenger A, MD. Schedule an appointment as soon as possible for a visit on 06/14/2013.   Specialty:  General Surgery   Contact information:   [address] [place] [id number] [phone]       Signed: Franky MachoJENKINS,Yasemin Rabon A 06/07/2013, 1:07 PM

## 2013-06-08 ENCOUNTER — Emergency Department (HOSPITAL_COMMUNITY): Payer: Medicare Other

## 2013-06-08 ENCOUNTER — Inpatient Hospital Stay (HOSPITAL_COMMUNITY)
Admission: EM | Admit: 2013-06-08 | Discharge: 2013-06-12 | DRG: 921 | Disposition: A | Discharge status: Home / Self Care | Payer: Medicare Other | Attending: General Surgery | Admitting: General Surgery

## 2013-06-08 ENCOUNTER — Encounter (HOSPITAL_COMMUNITY): Payer: Self-pay | Admitting: Emergency Medicine

## 2013-06-08 DIAGNOSIS — M129 Arthropathy, unspecified: Secondary | ICD-10-CM | POA: Diagnosis present

## 2013-06-08 DIAGNOSIS — IMO0002 Reserved for concepts with insufficient information to code with codable children: Principal | ICD-10-CM | POA: Diagnosis present

## 2013-06-08 DIAGNOSIS — K219 Gastro-esophageal reflux disease without esophagitis: Secondary | ICD-10-CM | POA: Diagnosis present

## 2013-06-08 DIAGNOSIS — E119 Type 2 diabetes mellitus without complications: Secondary | ICD-10-CM | POA: Diagnosis present

## 2013-06-08 DIAGNOSIS — Y836 Removal of other organ (partial) (total) as the cause of abnormal reaction of the patient, or of later complication, without mention of misadventure at the time of the procedure: Secondary | ICD-10-CM | POA: Diagnosis present

## 2013-06-08 DIAGNOSIS — F17200 Nicotine dependence, unspecified, uncomplicated: Secondary | ICD-10-CM | POA: Diagnosis present

## 2013-06-08 DIAGNOSIS — R509 Fever, unspecified: Secondary | ICD-10-CM

## 2013-06-08 DIAGNOSIS — Z833 Family history of diabetes mellitus: Secondary | ICD-10-CM

## 2013-06-08 DIAGNOSIS — D72829 Elevated white blood cell count, unspecified: Secondary | ICD-10-CM | POA: Diagnosis present

## 2013-06-08 DIAGNOSIS — E042 Nontoxic multinodular goiter: Secondary | ICD-10-CM | POA: Diagnosis present

## 2013-06-08 DIAGNOSIS — E860 Dehydration: Secondary | ICD-10-CM | POA: Diagnosis present

## 2013-06-08 DIAGNOSIS — G8918 Other acute postprocedural pain: Secondary | ICD-10-CM

## 2013-06-08 DIAGNOSIS — F3289 Other specified depressive episodes: Secondary | ICD-10-CM | POA: Diagnosis present

## 2013-06-08 DIAGNOSIS — IMO0001 Reserved for inherently not codable concepts without codable children: Secondary | ICD-10-CM | POA: Diagnosis present

## 2013-06-08 DIAGNOSIS — F329 Major depressive disorder, single episode, unspecified: Secondary | ICD-10-CM | POA: Diagnosis present

## 2013-06-08 DIAGNOSIS — R1310 Dysphagia, unspecified: Secondary | ICD-10-CM | POA: Diagnosis present

## 2013-06-08 LAB — CBC WITH DIFFERENTIAL/PLATELET
Basophils Absolute: 0 10*3/uL (ref 0.0–0.1)
Basophils Relative: 0 % (ref 0–1)
EOS ABS: 0 10*3/uL (ref 0.0–0.7)
Eosinophils Relative: 0 % (ref 0–5)
HCT: 29 % — ABNORMAL LOW (ref 36.0–46.0)
Hemoglobin: 9.9 g/dL — ABNORMAL LOW (ref 12.0–15.0)
LYMPHS ABS: 0.3 10*3/uL — AB (ref 0.7–4.0)
LYMPHS PCT: 2 % — AB (ref 12–46)
MCH: 28.4 pg (ref 26.0–34.0)
MCHC: 34.1 g/dL (ref 30.0–36.0)
MCV: 83.1 fL (ref 78.0–100.0)
Monocytes Absolute: 0.7 10*3/uL (ref 0.1–1.0)
Monocytes Relative: 3 % (ref 3–12)
Neutro Abs: 20.5 10*3/uL — ABNORMAL HIGH (ref 1.7–7.7)
Neutrophils Relative %: 95 % — ABNORMAL HIGH (ref 43–77)
PLATELETS: 248 10*3/uL (ref 150–400)
RBC: 3.49 MIL/uL — AB (ref 3.87–5.11)
RDW: 15.2 % (ref 11.5–15.5)
WBC: 21.6 10*3/uL — AB (ref 4.0–10.5)

## 2013-06-08 LAB — URINALYSIS, ROUTINE W REFLEX MICROSCOPIC
Bilirubin Urine: NEGATIVE
HGB URINE DIPSTICK: NEGATIVE
Ketones, ur: NEGATIVE mg/dL
Leukocytes, UA: NEGATIVE
Nitrite: NEGATIVE
PH: 7 (ref 5.0–8.0)
PROTEIN: NEGATIVE mg/dL
SPECIFIC GRAVITY, URINE: 1.01 (ref 1.005–1.030)
Urobilinogen, UA: 0.2 mg/dL (ref 0.0–1.0)

## 2013-06-08 LAB — BASIC METABOLIC PANEL
BUN: 27 mg/dL — ABNORMAL HIGH (ref 6–23)
CO2: 22 mEq/L (ref 19–32)
Calcium: 8.7 mg/dL (ref 8.4–10.5)
Chloride: 97 mEq/L (ref 96–112)
Creatinine, Ser: 1 mg/dL (ref 0.50–1.10)
GFR calc Af Amer: 71 mL/min — ABNORMAL LOW (ref >90)
GFR, EST NON AFRICAN AMERICAN: 61 mL/min — AB (ref >90)
GLUCOSE: 211 mg/dL — AB (ref 70–99)
Potassium: 4.1 mEq/L (ref 3.7–5.3)
SODIUM: 137 meq/L (ref 137–147)

## 2013-06-08 LAB — URINE MICROSCOPIC-ADD ON

## 2013-06-08 LAB — CBG MONITORING, ED: GLUCOSE-CAPILLARY: 200 mg/dL — AB (ref 70–99)

## 2013-06-08 MED ORDER — TRAZODONE HCL 50 MG PO TABS
100.0000 mg | ORAL_TABLET | Freq: Every day | ORAL | Status: DC
  Administered 2013-06-11: 100 mg via ORAL
  Filled 2013-06-08 (×3): qty 2

## 2013-06-08 MED ORDER — FENOFIBRATE 160 MG PO TABS
160.0000 mg | ORAL_TABLET | Freq: Every day | ORAL | Status: DC
  Administered 2013-06-11 – 2013-06-12 (×2): 160 mg via ORAL
  Filled 2013-06-08 (×5): qty 1

## 2013-06-08 MED ORDER — TRIAMCINOLONE ACETONIDE 55 MCG/ACT NA AERO
1.0000 | INHALATION_SPRAY | Freq: Every day | NASAL | Status: DC
  Administered 2013-06-11: 1 via NASAL
  Filled 2013-06-08: qty 16.5

## 2013-06-08 MED ORDER — HYDROMORPHONE HCL PF 1 MG/ML IJ SOLN
1.0000 mg | INTRAMUSCULAR | Status: DC | PRN
  Administered 2013-06-08 – 2013-06-12 (×14): 1 mg via INTRAVENOUS
  Filled 2013-06-08 (×15): qty 1

## 2013-06-08 MED ORDER — ONDANSETRON HCL 4 MG/2ML IJ SOLN: 4.0000 mg | Freq: Four times a day (QID) | INTRAMUSCULAR | Status: DC | PRN

## 2013-06-08 MED ORDER — LACTATED RINGERS IV SOLN
INTRAVENOUS | Status: DC
  Administered 2013-06-09 – 2013-06-11 (×5): via INTRAVENOUS

## 2013-06-08 MED ORDER — VANCOMYCIN HCL IN DEXTROSE 1-5 GM/200ML-% IV SOLN
1000.0000 mg | Freq: Once | INTRAVENOUS | Status: AC
  Administered 2013-06-08: 1000 mg via INTRAVENOUS
  Filled 2013-06-08: qty 200

## 2013-06-08 MED ORDER — LORAZEPAM 2 MG/ML IJ SOLN
1.0000 mg | INTRAMUSCULAR | Status: DC | PRN
  Administered 2013-06-09 – 2013-06-10 (×6): 1 mg via INTRAVENOUS
  Filled 2013-06-08 (×7): qty 1

## 2013-06-08 MED ORDER — MORPHINE SULFATE 4 MG/ML IJ SOLN
4.0000 mg | Freq: Once | INTRAMUSCULAR | Status: AC
  Administered 2013-06-08: 4 mg via INTRAVENOUS
  Filled 2013-06-08: qty 1

## 2013-06-08 MED ORDER — METFORMIN HCL 500 MG PO TABS
500.0000 mg | ORAL_TABLET | Freq: Two times a day (BID) | ORAL | Status: DC
  Administered 2013-06-10 – 2013-06-12 (×4): 500 mg via ORAL
  Filled 2013-06-08 (×4): qty 1

## 2013-06-08 MED ORDER — ONDANSETRON HCL 4 MG/2ML IJ SOLN
4.0000 mg | Freq: Once | INTRAMUSCULAR | Status: AC
  Administered 2013-06-08: 4 mg via INTRAVENOUS
  Filled 2013-06-08: qty 2

## 2013-06-08 MED ORDER — ACETAMINOPHEN 325 MG PO TABS
650.0000 mg | ORAL_TABLET | Freq: Four times a day (QID) | ORAL | Status: DC | PRN
  Administered 2013-06-11: 650 mg via ORAL
  Filled 2013-06-08: qty 2

## 2013-06-08 MED ORDER — NICOTINE 21 MG/24HR TD PT24
21.0000 mg | MEDICATED_PATCH | Freq: Every day | TRANSDERMAL | Status: DC
  Filled 2013-06-08 (×2): qty 1

## 2013-06-08 MED ORDER — HYDROMORPHONE HCL PF 2 MG/ML IJ SOLN
INTRAMUSCULAR | Status: AC
  Filled 2013-06-08: qty 1

## 2013-06-08 MED ORDER — INSULIN ASPART 100 UNIT/ML ~~LOC~~ SOLN
0.0000 [IU] | Freq: Three times a day (TID) | SUBCUTANEOUS | Status: DC
Start: 2013-06-09 — End: 2013-06-12
  Administered 2013-06-09: 2 [IU] via SUBCUTANEOUS
  Administered 2013-06-09: 3 [IU] via SUBCUTANEOUS
  Administered 2013-06-09: 2 [IU] via SUBCUTANEOUS
  Administered 2013-06-10 (×2): 3 [IU] via SUBCUTANEOUS
  Administered 2013-06-10: 2 [IU] via SUBCUTANEOUS
  Administered 2013-06-10 – 2013-06-11 (×2): 3 [IU] via SUBCUTANEOUS
  Administered 2013-06-11: 2 [IU] via SUBCUTANEOUS
  Administered 2013-06-11: 3 [IU] via SUBCUTANEOUS
  Administered 2013-06-12: 2 [IU] via SUBCUTANEOUS

## 2013-06-08 MED ORDER — ACETAMINOPHEN 650 MG RE SUPP
650.0000 mg | Freq: Once | RECTAL | Status: AC
  Administered 2013-06-08: 650 mg via RECTAL
  Filled 2013-06-08: qty 1

## 2013-06-08 MED ORDER — DIPHENHYDRAMINE HCL 12.5 MG/5ML PO ELIX: 12.5000 mg | ORAL_SOLUTION | Freq: Four times a day (QID) | ORAL | Status: DC | PRN

## 2013-06-08 MED ORDER — LISINOPRIL 10 MG PO TABS
10.0000 mg | ORAL_TABLET | Freq: Every day | ORAL | Status: DC
  Administered 2013-06-11 – 2013-06-12 (×2): 10 mg via ORAL
  Filled 2013-06-08: qty 1
  Filled 2013-06-08: qty 2
  Filled 2013-06-08: qty 1

## 2013-06-08 MED ORDER — IOHEXOL 300 MG/ML  SOLN
75.0000 mL | Freq: Once | INTRAMUSCULAR | Status: AC | PRN
Start: 2013-06-08 — End: 2013-06-08
  Administered 2013-06-08: 75 mL via INTRAVENOUS

## 2013-06-08 MED ORDER — ESCITALOPRAM OXALATE 10 MG PO TABS
20.0000 mg | ORAL_TABLET | Freq: Every day | ORAL | Status: DC
  Administered 2013-06-11 – 2013-06-12 (×2): 20 mg via ORAL
  Filled 2013-06-08 (×3): qty 2
  Filled 2013-06-08 (×2): qty 1
  Filled 2013-06-08: qty 2

## 2013-06-08 MED ORDER — CALCIUM 600 + MINERALS 600-200 MG-UNIT PO TABS: 1.0000 | ORAL_TABLET | Freq: Every day | ORAL | Status: DC

## 2013-06-08 MED ORDER — DIPHENHYDRAMINE HCL 50 MG/ML IJ SOLN: 12.5000 mg | Freq: Four times a day (QID) | INTRAMUSCULAR | Status: DC | PRN

## 2013-06-08 MED ORDER — VANCOMYCIN HCL 10 G IV SOLR
1250.0000 mg | INTRAVENOUS | Status: DC
  Administered 2013-06-09: 1250 mg via INTRAVENOUS
  Filled 2013-06-08: qty 1250

## 2013-06-08 MED ORDER — LORATADINE 10 MG PO TABS
10.0000 mg | ORAL_TABLET | Freq: Every day | ORAL | Status: DC
  Administered 2013-06-11 – 2013-06-12 (×2): 10 mg via ORAL
  Filled 2013-06-08 (×4): qty 1

## 2013-06-08 MED ORDER — CANAGLIFLOZIN 300 MG PO TABS
1.0000 | ORAL_TABLET | Freq: Every day | ORAL | Status: DC
  Filled 2013-06-08 (×2): qty 1

## 2013-06-08 MED ORDER — ACETAMINOPHEN 650 MG RE SUPP: 650.0000 mg | Freq: Four times a day (QID) | RECTAL | Status: DC | PRN

## 2013-06-08 MED ORDER — ALPRAZOLAM 1 MG PO TABS: 1.0000 mg | ORAL_TABLET | Freq: Every evening | ORAL | Status: DC | PRN

## 2013-06-08 MED ORDER — DULOXETINE HCL 30 MG PO CPEP
30.0000 mg | ORAL_CAPSULE | Freq: Every day | ORAL | Status: DC
  Administered 2013-06-11 – 2013-06-12 (×2): 30 mg via ORAL
  Filled 2013-06-08 (×4): qty 1

## 2013-06-08 MED ORDER — LEVOTHYROXINE SODIUM 75 MCG PO TABS
75.0000 ug | ORAL_TABLET | Freq: Every day | ORAL | Status: DC
  Filled 2013-06-08 (×3): qty 1

## 2013-06-08 NOTE — H&P (Signed)
Linda Riggs is an 58 y.o. female.   Chief Complaint: Fever and difficulty swallowing HPI: Patient is a 58 year old white female status post a total thyroidectomy for multinodular goiter 3 days ago who presents with ongoing difficulty in swallowing liquids. She also has a cough. She is a smoker. She denies any significant productive cough. She complains of pain at the incision site. She denies any air hunger or pressure sensation on her trachea. She has not had any emesis.  Past Medical History  Diagnosis Date  . Depression   . Fibromyalgia   . Arthritis   . Type 2 diabetes mellitus   . Iron deficiency anemia     Dr. Deatra Ina 11/10  . GERD (gastroesophageal reflux disease)   . Chronic headaches   . Diverticulosis   . Internal hemorrhoids   . Goiter     Past Surgical History  Procedure Laterality Date  . Arthroscopic left knee surgery Bilateral   . Dilation and curettage of uterus    . Thyroidectomy N/A 06/06/2013    Procedure: TOTAL THYROIDECTOMY;  Surgeon: Linda So, MD;  Location: [facility];  Service: General;  Laterality: N/A;    Family History  Problem Relation Age of Onset  . Diabetes type II Mother   . Diabetes type II Sister   . Diabetes type II Father   . Cancer Father     Lung   Social History:  reports that she has been smoking Cigarettes.  She has a 20 pack-year smoking history. She does not have any smokeless tobacco history on file. She reports that she does not drink alcohol or use illicit drugs.  Allergies:  Allergies  Allergen Reactions  . Flonase [Fluticasone Propionate] Itching  . Prednisone Itching  . Tramadol Itching     (Not in a hospital admission)  Results for orders placed during the hospital encounter of 06/08/13 (from the past 48 hour(s))  CBC WITH DIFFERENTIAL     Status: Abnormal   Collection Time    06/08/13  7:07 PM      Result Value Ref Range   WBC 21.6 (*) 4.0 - 10.5 K/uL   RBC 3.49 (*) 3.87 - 5.11 MIL/uL   Hemoglobin 9.9 (*)  12.0 - 15.0 g/dL   HCT 29.0 (*) 36.0 - 46.0 %   MCV 83.1  78.0 - 100.0 fL   MCH 28.4  26.0 - 34.0 pg   MCHC 34.1  30.0 - 36.0 g/dL   RDW 15.2  11.5 - 15.5 %   Platelets 248  150 - 400 K/uL   Neutrophils Relative % 95 (*) 43 - 77 %   Neutro Abs 20.5 (*) 1.7 - 7.7 K/uL   Lymphocytes Relative 2 (*) 12 - 46 %   Lymphs Abs 0.3 (*) 0.7 - 4.0 K/uL   Monocytes Relative 3  3 - 12 %   Monocytes Absolute 0.7  0.1 - 1.0 K/uL   Eosinophils Relative 0  0 - 5 %   Eosinophils Absolute 0.0  0.0 - 0.7 K/uL   Basophils Relative 0  0 - 1 %   Basophils Absolute 0.0  0.0 - 0.1 K/uL  BASIC METABOLIC PANEL     Status: Abnormal   Collection Time    06/08/13  7:07 PM      Result Value Ref Range   Sodium 137  137 - 147 mEq/L   Potassium 4.1  3.7 - 5.3 mEq/L   Chloride 97  96 - 112 mEq/L  CO2 22  19 - 32 mEq/L   Glucose, Bld 211 (*) 70 - 99 mg/dL   BUN 27 (*) 6 - 23 mg/dL   Creatinine, Ser 1.00  0.50 - 1.10 mg/dL   Calcium 8.7  8.4 - 10.5 mg/dL   GFR calc non Af Amer 61 (*) >90 mL/min   GFR calc Af Amer 71 (*) >90 mL/min   Comment: (NOTE)     The eGFR has been calculated using the CKD EPI equation.     This calculation has not been validated in all clinical situations.     eGFR's persistently <90 mL/min signify possible Chronic Kidney     Disease.  URINALYSIS, ROUTINE W REFLEX MICROSCOPIC     Status: Abnormal   Collection Time    06/08/13  7:40 PM      Result Value Ref Range   Color, Urine YELLOW  YELLOW   APPearance CLEAR  CLEAR   Specific Gravity, Urine 1.010  1.005 - 1.030   pH 7.0  5.0 - 8.0   Glucose, UA >1000 (*) NEGATIVE mg/dL   Hgb urine dipstick NEGATIVE  NEGATIVE   Bilirubin Urine NEGATIVE  NEGATIVE   Ketones, ur NEGATIVE  NEGATIVE mg/dL   Protein, ur NEGATIVE  NEGATIVE mg/dL   Urobilinogen, UA 0.2  0.0 - 1.0 mg/dL   Nitrite NEGATIVE  NEGATIVE   Leukocytes, UA NEGATIVE  NEGATIVE  URINE MICROSCOPIC-ADD ON     Status: None   Collection Time    06/08/13  7:40 PM      Result Value  Ref Range   Squamous Epithelial / LPF RARE  RARE   WBC, UA 0-2  <3 WBC/hpf   Bacteria, UA RARE  RARE  CBG MONITORING, ED     Status: Abnormal   Collection Time    06/08/13  8:15 PM      Result Value Ref Range   Glucose-Capillary 200 (*) 70 - 99 mg/dL   Dg Chest 2 View  06/08/2013   CLINICAL DATA Fever.  Cough.  Short of breath.  EXAM CHEST  2 VIEW  COMPARISON DG CHEST 2V dated 09/12/2011  FINDINGS Cardiopericardial silhouette within normal limits. Mediastinal contours normal. Trachea midline. No airspace disease or effusion.  IMPRESSION No active cardiopulmonary disease.  SIGNATURE  Electronically Signed   By: Dereck Ligas M.D.   On: 06/08/2013 21:21   Ct Soft Tissue Neck W Contrast  06/08/2013   CLINICAL DATA Thyroidectomy 2 days prior with trouble swallowing.  EXAM CT NECK WITH CONTRAST  TECHNIQUE Multidetector CT imaging of the neck was performed using the standard protocol following the bolus administration of intravenous contrast.  CONTRAST 75m OMNIPAQUE IOHEXOL 300 MG/ML  SOLN  COMPARISON None.  FINDINGS There is extensive fluid and gas collection within the thyroidectomy bed, measuring 7 x 3.3 x 7 cm. There is a rounded high attenuation area left of the trachea which could represent tissue or hematoma (hematoma favored given it is nearly completely surrounded by gas). Gas tracks through the strap muscles into the ventral neck, extending immediately subcutaneous. There is early extension towards the mediastinum. There is superior extension into the retropharyngeal is space. The airway remains patent. Venous structures patent. No visible tracheal or esophageal injury, although limited compared to esophagram.  No adenopathy. Unremarkable salivary glands. Clear apical lungs. No acute osseous findings.  Critical Value/emergent results were called by telephone at the time of interpretation on 06/08/2013 at 9:14 PM to Dr. CJoseph Berkshire, who verbally acknowledged these  results.  IMPRESSION  Large gas and fluid collection filling the thyroidectomy bed, extending into the subcutaneous neck. The fluid is complex density and may represent postoperative hemorrhage. Although sterility is indeterminate by imaging, leukocytosis is concerning for developing abscess.  SIGNATURE  Electronically Signed   By: Jorje Guild M.D.   On: 06/08/2013 21:26    Review of Systems  Constitutional: Positive for fever and malaise/fatigue.  HENT: Positive for sore throat.   Eyes: Negative.   Respiratory: Positive for cough.   Cardiovascular: Negative.   Gastrointestinal: Negative.   Genitourinary: Negative.   Musculoskeletal: Positive for neck pain.  Skin: Negative.   All other systems reviewed and are negative.    Blood pressure 102/42, pulse 95, temperature 99.3 F (37.4 C), temperature source Oral, resp. rate 32, height '5\' 2"'  (1.575 m), weight 67.132 kg (148 lb), SpO2 95.00%. Physical Exam  Constitutional: She appears well-developed and well-nourished. She appears distressed.  HENT:  Head: Normocephalic and atraumatic.  Neck: Normal range of motion. No tracheal deviation present.  Lower transverse neck incision healing well. Swelling and ecchymosis noted, but not tense. Trachea still appears midline. Needle aspiration revealed 25 cc of serosanguineous fluid.  Cardiovascular: Normal rate, regular rhythm and normal heart sounds.   Respiratory: Effort normal and breath sounds normal. No stridor.  GI: Soft.     Assessment/Plan Impression: Hematoma, neck, status post total thyroidectomy for multinodular goiter. Final pathology was negative for malignancy. She had a very large thyroid gland with the left lobe measuring 12 x 7 x 4 cm.  Right lobe was 10 x 5 cm. I explained to the patient and family that the enlarged space did collect fluid, despite a drain being placed intraoperatively and being removed at the time of discharge. I will burn her in the hospital and start vancomycin empirically do to  her leukocytosis. There does not appear to be any tracheal or esophageal injury present. She may be another aspiration of the fluid  recollect  Ciria Bernardini A 06/08/2013, 10:03 PM

## 2013-06-08 NOTE — Progress Notes (Signed)
ANTIBIOTIC CONSULT NOTE - INITIAL  Pharmacy Consult for Vancomycin  Indication: wound infection  Allergies  Allergen Reactions  . Flonase [Fluticasone Propionate] Itching  . Prednisone Itching  . Tramadol Itching    Patient Measurements: Height: 5\' 2"  (157.5 cm) Weight: 148 lb (67.132 kg) IBW/kg (Calculated) : 50.1  Vital Signs: Temp: 99.3 F (37.4 C) (03/11 2123) Temp src: Oral (03/11 1844) BP: 102/42 mmHg (03/11 2123) Pulse Rate: 95 (03/11 2123) Intake/Output from previous day:   Intake/Output from this shift:    Labs:  Recent Labs  06/06/13 1426 06/07/13 0625 06/08/13 1907  WBC  --  9.2 21.6*  HGB  --  9.6* 9.9*  PLT  --  235 248  CREATININE 0.97 0.86 1.00   Estimated Creatinine Clearance: 55.1 ml/min (by C-G formula based on Cr of 1). No results found for this basename: VANCOTROUGH, VANCOPEAK, VANCORANDOM, GENTTROUGH, GENTPEAK, GENTRANDOM, TOBRATROUGH, TOBRAPEAK, TOBRARND, AMIKACINPEAK, AMIKACINTROU, AMIKACIN,  in the last 72 hours   Microbiology: No results found for this or any previous visit (from the past 720 hour(s)).  Medical History: Past Medical History  Diagnosis Date  . Depression   . Fibromyalgia   . Arthritis   . Type 2 diabetes mellitus   . Iron deficiency anemia     Dr. Arlyce DiceKaplan 11/10  . GERD (gastroesophageal reflux disease)   . Chronic headaches   . Diverticulosis   . Internal hemorrhoids   . Goiter     Medications:  Scheduled:   Assessment: 58 yo F s/p thyroidectomy on 3/9.  She was discharged home 3/10.  She represents to ED with difficulty swallowing.   Her WBC is elevated and she is febrile.  Renal function is at patient's baseline.  Requested to dose Vancomycin for wound infection.  Vancomycin 3/11>>  Goal of Therapy:  Vancomycin trough level 10-15 mcg/ml  Plan:  Vancomycin 1g IV x1 now, then 1250mg  IV q24 Check Vancomycin trough at steady state Monitor renal function and cx data   Elson ClanLilliston, Kaydan Wong  Michelle 06/08/2013,9:56 PM

## 2013-06-08 NOTE — ED Provider Notes (Signed)
CSN: [id number]     Arrival date & time 06/08/13  1818 History   First MD Initiated Contact with Patient 06/08/13 1854     Chief Complaint  Patient presents with  . Shortness of Breath  . Neck Pain     (Consider location/radiation/quality/duration/timing/severity/associated sxs/prior Treatment) HPI Comments: Patient presents to the ER for evaluation of increasing pain at her surgical site where she had thyroid surgery 2 days ago. She reports that the area has become progressively swollen and now she is having trouble swallowing. She is feeling short of breath. She has had a cough which has been productive of sputum. She was found to be febrile during triage process, had not noted any fevers at home. Pain is constant and moderate to severe.  Patient is a 58 y.o. female presenting with shortness of breath and neck pain.  Shortness of Breath Associated symptoms: neck pain   Neck Pain   Past Medical History  Diagnosis Date  . Depression   . Fibromyalgia   . Arthritis   . Type 2 diabetes mellitus   . Iron deficiency anemia     Dr. Arlyce Dice 11/10  . GERD (gastroesophageal reflux disease)   . Chronic headaches   . Diverticulosis   . Internal hemorrhoids   . Goiter    Past Surgical History  Procedure Laterality Date  . Arthroscopic left knee surgery Bilateral   . Dilation and curettage of uterus    . Thyroidectomy N/A 06/06/2013    Procedure: TOTAL THYROIDECTOMY;  Surgeon: Dalia Heading, MD;  Location: [facility];  Service: General;  Laterality: N/A;   Family History  Problem Relation Age of Onset  . Diabetes type II Mother   . Diabetes type II Sister   . Diabetes type II Father   . Cancer Father     Lung   History  Substance Use Topics  . Smoking status: Current Every Day Smoker -- 1.00 packs/day for 20 years    Types: Cigarettes  . Smokeless tobacco: Not on file  . Alcohol Use: No   OB History   Grav Para Term Preterm Abortions TAB SAB Ect Mult Living                  Review of Systems  HENT: Positive for trouble swallowing.   Respiratory: Positive for shortness of breath.   Musculoskeletal: Positive for neck pain.  All other systems reviewed and are negative.      Allergies  Flonase; Prednisone; and Tramadol  Home Medications   Current Outpatient Rx  Name  Route  Sig  Dispense  Refill  . alendronate (FOSAMAX) 70 MG tablet   Oral   Take 70 mg by mouth every 7 (seven) days. Take with a full glass of water on an empty stomach.          . ALPRAZolam (XANAX) 1 MG tablet   Oral   Take 1 mg by mouth at bedtime as needed for sleep.          Marland Kitchen atorvastatin (LIPITOR) 80 MG tablet   Oral   Take 80 mg by mouth daily.         . Calcium Carbonate-Vit D-Min (CALCIUM 600 + MINERALS) 600-200 MG-UNIT TABS   Oral   Take 1 tablet by mouth daily.          . Canagliflozin (INVOKANA) 300 MG TABS   Oral   Take 1 tablet by mouth daily.          Marland Kitchen  celecoxib (CELEBREX) 200 MG capsule   Oral   Take 200 mg by mouth 2 (two) times daily.          . DULoxetine (CYMBALTA) 30 MG capsule   Oral   Take 30 mg by mouth daily.           Marland Kitchen. escitalopram (LEXAPRO) 20 MG tablet   Oral   Take 20 mg by mouth daily.           Marland Kitchen. esomeprazole (NEXIUM) 40 MG capsule   Oral   Take 40 mg by mouth daily before breakfast.           . fenofibrate (TRICOR) 145 MG tablet   Oral   Take 145 mg by mouth daily.         . ferrous fumarate (HEMOCYTE - 106 MG FE) 325 (106 FE) MG TABS tablet   Oral   Take 1 tablet by mouth 3 (three) times daily.         . fexofenadine (ALLEGRA) 180 MG tablet   Oral   Take 180 mg by mouth daily.           Marland Kitchen. levothyroxine (SYNTHROID, LEVOTHROID) 75 MCG tablet   Oral   Take 75 mcg by mouth daily before breakfast.         . metFORMIN (GLUCOPHAGE) 500 MG tablet   Oral   Take 500 mg by mouth 2 (two) times daily with a meal.           . oxyCODONE-acetaminophen (PERCOCET) 7.5-325 MG per tablet   Oral   Take 1-2  tablets by mouth every 4 (four) hours as needed for pain.         . potassium gluconate 595 MG TABS   Oral   Take 595 mg by mouth daily.          . quinapril (ACCUPRIL) 10 MG tablet   Oral   Take 10 mg by mouth at bedtime.           . traZODone (DESYREL) 100 MG tablet   Oral   Take 100 mg by mouth at bedtime.         Marland Kitchen. HYDROcodone-acetaminophen (NORCO/VICODIN) 5-325 MG per tablet   Oral   Take 1-2 tablets by mouth every 6 (six) hours as needed. pain         . triamcinolone (NASACORT) 55 MCG/ACT AERO nasal inhaler   Nasal   Place 1 spray into the nose daily.          BP 102/42  Pulse 95  Temp(Src) 99.3 F (37.4 C) (Oral)  Resp 32  Ht 5\' 2"  (1.575 m)  Wt 148 lb (67.132 kg)  BMI 27.06 kg/m2  SpO2 95% Physical Exam  Constitutional: She is oriented to person, place, and time. She appears well-developed and well-nourished. No distress.  HENT:  Head: Normocephalic and atraumatic.  Right Ear: Hearing normal.  Left Ear: Hearing normal.  Nose: Nose normal.  Mouth/Throat: Oropharynx is clear and moist and mucous membranes are normal.  Eyes: Conjunctivae and EOM are normal. Pupils are equal, round, and reactive to light.  Neck: Normal range of motion. Neck supple.  Cardiovascular: Regular rhythm, S1 normal and S2 normal.  Exam reveals no gallop and no friction rub.   No murmur heard. Pulmonary/Chest: Effort normal and breath sounds normal. No respiratory distress. She exhibits no tenderness.  Abdominal: Soft. Normal appearance and bowel sounds are normal. There is no hepatosplenomegaly. There is no tenderness. There  is no rebound, no guarding, no tenderness at McBurney's point and negative Murphy's sign. No hernia.  Musculoskeletal: Normal range of motion.  Neurological: She is alert and oriented to person, place, and time. She has normal strength. No cranial nerve deficit or sensory deficit. Coordination normal. GCS eye subscore is 4. GCS verbal subscore is 5. GCS  motor subscore is 6.  Skin: Skin is warm, dry and intact. There is erythema. No cyanosis.     Psychiatric: She has a normal mood and affect. Her speech is normal and behavior is normal. Thought content normal.    ED Course  Procedures (including critical care time) Labs Review Labs Reviewed  CBC WITH DIFFERENTIAL - Abnormal; Notable for the following:    WBC 21.6 (*)    RBC 3.49 (*)    Hemoglobin 9.9 (*)    HCT 29.0 (*)    Neutrophils Relative % 95 (*)    Neutro Abs 20.5 (*)    Lymphocytes Relative 2 (*)    Lymphs Abs 0.3 (*)    All other components within normal limits  BASIC METABOLIC PANEL - Abnormal; Notable for the following:    Glucose, Bld 211 (*)    BUN 27 (*)    GFR calc non Af Amer 61 (*)    GFR calc Af Amer 71 (*)    All other components within normal limits  URINALYSIS, ROUTINE W REFLEX MICROSCOPIC - Abnormal; Notable for the following:    Glucose, UA >1000 (*)    All other components within normal limits  CBG MONITORING, ED - Abnormal; Notable for the following:    Glucose-Capillary 200 (*)    All other components within normal limits  CULTURE, BLOOD (ROUTINE X 2)  CULTURE, BLOOD (ROUTINE X 2)  URINE MICROSCOPIC-ADD ON   Imaging Review Dg Chest 2 View  06/08/2013   CLINICAL DATA Fever.  Cough.  Short of breath.  EXAM CHEST  2 VIEW  COMPARISON DG CHEST 2V dated 09/12/2011  FINDINGS Cardiopericardial silhouette within normal limits. Mediastinal contours normal. Trachea midline. No airspace disease or effusion.  IMPRESSION No active cardiopulmonary disease.  SIGNATURE  Electronically Signed   By: Andreas Newport M.D.   On: 06/08/2013 21:21   Ct Soft Tissue Neck W Contrast  06/08/2013   CLINICAL DATA Thyroidectomy 2 days prior with trouble swallowing.  EXAM CT NECK WITH CONTRAST  TECHNIQUE Multidetector CT imaging of the neck was performed using the standard protocol following the bolus administration of intravenous contrast.  CONTRAST 75mL OMNIPAQUE IOHEXOL 300  MG/ML  SOLN  COMPARISON None.  FINDINGS There is extensive fluid and gas collection within the thyroidectomy bed, measuring 7 x 3.3 x 7 cm. There is a rounded high attenuation area left of the trachea which could represent tissue or hematoma (hematoma favored given it is nearly completely surrounded by gas). Gas tracks through the strap muscles into the ventral neck, extending immediately subcutaneous. There is early extension towards the mediastinum. There is superior extension into the retropharyngeal is space. The airway remains patent. Venous structures patent. No visible tracheal or esophageal injury, although limited compared to esophagram.  No adenopathy. Unremarkable salivary glands. Clear apical lungs. No acute osseous findings.  Critical Value/emergent results were called by telephone at the time of interpretation on 06/08/2013 at 9:14 PM to Dr. Jaci Carrel , who verbally acknowledged these results.  IMPRESSION Large gas and fluid collection filling the thyroidectomy bed, extending into the subcutaneous neck. The fluid is complex density and may represent  postoperative hemorrhage. Although sterility is indeterminate by imaging, leukocytosis is concerning for developing abscess.  SIGNATURE  Electronically Signed   By: Tiburcio Pea M.D.   On: 06/08/2013 21:26     EKG Interpretation None      MDM   Final diagnoses:  None    The patient presents with complaints of swelling at the site where she had thyroidectomy and has now begun to feel like she is having trouble swallowing and feels short of breath. She was noted to have fever of 101.7 upon arrival to the ER. Blood work shows significant leukocytosis, but also some hemoconcentration secondary to dehydration. She has not been eating or drinking because of the difficulty with her swallowing. She does have a productive cough and likely has undiagnosed COPD secondary to significant smoking history. Chest x-ray, however, does not show any  evidence of pneumonia.  Because of the amount of swelling present, to perform CT of soft tissue neck. There is a large fluid collection with some gas formation, cannot rule out early infection. She does have fever and elevated white blood cell count. Patient initiated on vancomycin. Doctor Lovell Sheehan, her surgeon was consulted. He has evaluated her in the ER and we'll get her to the hospital.    Gilda Crease, MD 06/08/13 2208

## 2013-06-08 NOTE — ED Notes (Signed)
Pt states she had "thyroid surgery" on Monday and now reports pain at surgical site as well as difficulty swallowing and SOB. Pt states "I can't take any of my medications or swallow water". Pt reports weakness due to not being able to eat.

## 2013-06-09 LAB — GLUCOSE, CAPILLARY
GLUCOSE-CAPILLARY: 143 mg/dL — AB (ref 70–99)
Glucose-Capillary: 153 mg/dL — ABNORMAL HIGH (ref 70–99)
Glucose-Capillary: 123 mg/dL — ABNORMAL HIGH (ref 70–99)
Glucose-Capillary: 137 mg/dL — ABNORMAL HIGH (ref 70–99)

## 2013-06-09 LAB — CBC
HCT: 24.6 % — ABNORMAL LOW (ref 36.0–46.0)
HEMOGLOBIN: 8.3 g/dL — AB (ref 12.0–15.0)
MCH: 28.2 pg (ref 26.0–34.0)
MCHC: 33.7 g/dL (ref 30.0–36.0)
MCV: 83.7 fL (ref 78.0–100.0)
Platelets: 227 10*3/uL (ref 150–400)
RBC: 2.94 MIL/uL — AB (ref 3.87–5.11)
RDW: 15.3 % (ref 11.5–15.5)
WBC: 17.6 10*3/uL — AB (ref 4.0–10.5)

## 2013-06-09 LAB — COMPREHENSIVE METABOLIC PANEL
ALT: 12 U/L (ref 0–35)
AST: 13 U/L (ref 0–37)
Albumin: 2.4 g/dL — ABNORMAL LOW (ref 3.5–5.2)
Alkaline Phosphatase: 36 U/L — ABNORMAL LOW (ref 39–117)
BILIRUBIN TOTAL: 0.4 mg/dL (ref 0.3–1.2)
BUN: 29 mg/dL — AB (ref 6–23)
CALCIUM: 8.1 mg/dL — AB (ref 8.4–10.5)
CHLORIDE: 100 meq/L (ref 96–112)
CO2: 25 mEq/L (ref 19–32)
CREATININE: 1.04 mg/dL (ref 0.50–1.10)
GFR calc Af Amer: 67 mL/min — ABNORMAL LOW (ref >90)
GFR, EST NON AFRICAN AMERICAN: 58 mL/min — AB (ref >90)
Glucose, Bld: 150 mg/dL — ABNORMAL HIGH (ref 70–99)
Potassium: 3.9 mEq/L (ref 3.7–5.3)
Sodium: 139 mEq/L (ref 137–147)
Total Protein: 6.2 g/dL (ref 6.0–8.3)

## 2013-06-09 MED ORDER — MAGIC MOUTHWASH W/LIDOCAINE: 5.0000 mL | Freq: Four times a day (QID) | ORAL | Status: DC

## 2013-06-09 MED ORDER — MAGIC MOUTHWASH
5.0000 mL | Freq: Four times a day (QID) | ORAL | Status: DC
  Administered 2013-06-09 – 2013-06-12 (×10): 5 mL via ORAL
  Filled 2013-06-09 (×9): qty 5

## 2013-06-09 MED ORDER — LIDOCAINE VISCOUS 2 % MT SOLN
5.0000 mL | Freq: Four times a day (QID) | OROMUCOSAL | Status: DC
  Administered 2013-06-09 – 2013-06-10 (×4): 5 mL via OROMUCOSAL
  Filled 2013-06-09 (×7): qty 15

## 2013-06-09 MED ORDER — CANAGLIFLOZIN 100 MG PO TABS
300.0000 mg | ORAL_TABLET | Freq: Every day | ORAL | Status: DC
  Administered 2013-06-11 – 2013-06-12 (×2): 300 mg via ORAL
  Filled 2013-06-09 (×5): qty 3

## 2013-06-09 MED ORDER — LEVOTHYROXINE SODIUM 100 MCG IV SOLR
75.0000 ug | Freq: Every day | INTRAVENOUS | Status: DC
  Administered 2013-06-09 – 2013-06-10 (×2): 75 ug via INTRAVENOUS
  Filled 2013-06-09 (×5): qty 5

## 2013-06-09 MED ORDER — CALCIUM CARBONATE-VITAMIN D 500-200 MG-UNIT PO TABS
1.0000 | ORAL_TABLET | Freq: Every day | ORAL | Status: DC
  Administered 2013-06-11 – 2013-06-12 (×2): 1 via ORAL
  Filled 2013-06-09 (×2): qty 1

## 2013-06-09 NOTE — Progress Notes (Signed)
Subjective: Patient states that her swallowing difficulty is the same as yesterday. She says it hurts to swallow.  Objective: Vital signs in last 24 hours: Temp:  [98.2 F (36.8 C)-101.7 F (38.7 C)] 98.2 F (36.8 C) (03/12 0344) Pulse Rate:  [90-114] 93 (03/12 0344) Resp:  [21-32] 21 (03/12 0344) BP: (102-144)/(41-54) 121/45 mmHg (03/12 0344) SpO2:  [94 %-100 %] 97 % (03/12 0344) Weight:  [67.132 kg (148 lb)-70.262 kg (154 lb 14.4 oz)] 70.262 kg (154 lb 14.4 oz) (03/11 2331) Last BM Date: 06/07/13 (Due pt.)  Intake/Output from previous day: 03/11 0701 - 03/12 0700 In: 595 [I.V.:595] Out: -  Intake/Output this shift:    General appearance: alert, cooperative and no distress Neck: Incision healing well, some swelling present. 20 cc of a seroma aspirated from left thyroid bed. Resp: Clear to auscultation with occasional expiratory wheezing noted. No rhonchi noted. Cardio: regular rate and rhythm, S1, S2 normal, no murmur, click, rub or gallop  Lab Results:   Recent Labs  06/08/13 1907 06/09/13 0513  WBC 21.6* 17.6*  HGB 9.9* 8.3*  HCT 29.0* 24.6*  PLT 248 227   BMET  Recent Labs  06/08/13 1907 06/09/13 0513  NA 137 139  K 4.1 3.9  CL 97 100  CO2 22 25  GLUCOSE 211* 150*  BUN 27* 29*  CREATININE 1.00 1.04  CALCIUM 8.7 8.1*   PT/INR No results found for this basename: LABPROT, INR,  in the last 72 hours  Studies/Results: Dg Chest 2 View  06/08/2013   CLINICAL DATA Fever.  Cough.  Short of breath.  EXAM CHEST  2 VIEW  COMPARISON DG CHEST 2V dated 09/12/2011  FINDINGS Cardiopericardial silhouette within normal limits. Mediastinal contours normal. Trachea midline. No airspace disease or effusion.  IMPRESSION No active cardiopulmonary disease.  SIGNATURE  Electronically Signed   By: Andreas NewportGeoffrey  Lamke M.D.   On: 06/08/2013 21:21   Ct Soft Tissue Neck W Contrast  06/08/2013   CLINICAL DATA Thyroidectomy 2 days prior with trouble swallowing.  EXAM CT NECK WITH  CONTRAST  TECHNIQUE Multidetector CT imaging of the neck was performed using the standard protocol following the bolus administration of intravenous contrast.  CONTRAST 75mL OMNIPAQUE IOHEXOL 300 MG/ML  SOLN  COMPARISON None.  FINDINGS There is extensive fluid and gas collection within the thyroidectomy bed, measuring 7 x 3.3 x 7 cm. There is a rounded high attenuation area left of the trachea which could represent tissue or hematoma (hematoma favored given it is nearly completely surrounded by gas). Gas tracks through the strap muscles into the ventral neck, extending immediately subcutaneous. There is early extension towards the mediastinum. There is superior extension into the retropharyngeal is space. The airway remains patent. Venous structures patent. No visible tracheal or esophageal injury, although limited compared to esophagram.  No adenopathy. Unremarkable salivary glands. Clear apical lungs. No acute osseous findings.  Critical Value/emergent results were called by telephone at the time of interpretation on 06/08/2013 at 9:14 PM to Dr. Jaci CarrelHRISTOPHER POLLINA , who verbally acknowledged these results.  IMPRESSION Large gas and fluid collection filling the thyroidectomy bed, extending into the subcutaneous neck. The fluid is complex density and may represent postoperative hemorrhage. Although sterility is indeterminate by imaging, leukocytosis is concerning for developing abscess.  SIGNATURE  Electronically Signed   By: Tiburcio PeaJonathan  Watts M.D.   On: 06/08/2013 21:26    Anti-infectives: Anti-infectives   Start     Dose/Rate Route Frequency Ordered Stop   06/09/13 1600  vancomycin (  VANCOCIN) 1,250 mg in sodium chloride 0.9 % 250 mL IVPB     1,250 mg 166.7 mL/hr over 90 Minutes Intravenous Every 24 hours 06/08/13 2304     06/08/13 2215  vancomycin (VANCOCIN) IVPB 1000 mg/200 mL premix     1,000 mg 200 mL/hr over 60 Minutes Intravenous  Once 06/08/13 2207 06/08/13 2348       Assessment/Plan: Impression: Status post total colectomy with postoperative seroma. Leukocytosis is resolving. Still with dysphagia. Continue IV vancomycin. We'll continue to follow expectantly. I suspect most of her dysphasia secondary to pain, which she has had since the surgery. Her throat is less scratchy. She is still able to talk without difficulty.  LOS: 1 day    Linda Riggs A 06/09/2013

## 2013-06-09 NOTE — Progress Notes (Signed)
Pt has been stating that when she closes her eyes "she sees and hears things that are not there". Pt does tell me that she is "very tired and needs a nap" Pt is otherwise in NAD. Will continue to monitor, if hallucinations get worse after pt naps will call MD.

## 2013-06-09 NOTE — Progress Notes (Signed)
Pt is still having difficulty swallowing. I asked pt if she would be agreeable to trying her morning meds crushed in applesauce. She was agreeable. When she tried some plain applesauce pt was unable to swallow the applesauce. Pt subsequently did not receive her morning medications today. MD was notified. Will continue to monitor.

## 2013-06-09 NOTE — Progress Notes (Signed)
Patient was given an IS to use post-op but was informed she was unable to swallow at this time and was in pain. I left the device at bedside to use once patient was better and able to use with no difficultly.

## 2013-06-10 LAB — CBC
HEMATOCRIT: 24.8 % — AB (ref 36.0–46.0)
Hemoglobin: 8.2 g/dL — ABNORMAL LOW (ref 12.0–15.0)
MCH: 27.8 pg (ref 26.0–34.0)
MCHC: 33.1 g/dL (ref 30.0–36.0)
MCV: 84.1 fL (ref 78.0–100.0)
Platelets: 242 10*3/uL (ref 150–400)
RBC: 2.95 MIL/uL — ABNORMAL LOW (ref 3.87–5.11)
RDW: 15 % (ref 11.5–15.5)
WBC: 14.1 10*3/uL — ABNORMAL HIGH (ref 4.0–10.5)

## 2013-06-10 LAB — GLUCOSE, CAPILLARY
Glucose-Capillary: 186 mg/dL — ABNORMAL HIGH (ref 70–99)
Glucose-Capillary: 138 mg/dL — ABNORMAL HIGH (ref 70–99)
Glucose-Capillary: 161 mg/dL — ABNORMAL HIGH (ref 70–99)

## 2013-06-10 LAB — BASIC METABOLIC PANEL
BUN: 28 mg/dL — AB (ref 6–23)
CALCIUM: 8.3 mg/dL — AB (ref 8.4–10.5)
CO2: 21 mEq/L (ref 19–32)
Chloride: 101 mEq/L (ref 96–112)
Creatinine, Ser: 0.81 mg/dL (ref 0.50–1.10)
GFR calc Af Amer: >90 mL/min (ref >90)
GFR, EST NON AFRICAN AMERICAN: 79 mL/min — AB (ref >90)
Glucose, Bld: 140 mg/dL — ABNORMAL HIGH (ref 70–99)
Potassium: 3.8 mEq/L (ref 3.7–5.3)
Sodium: 139 mEq/L (ref 137–147)

## 2013-06-10 MED ORDER — VANCOMYCIN HCL IN DEXTROSE 750-5 MG/150ML-% IV SOLN
750.0000 mg | Freq: Two times a day (BID) | INTRAVENOUS | Status: DC
  Administered 2013-06-10 – 2013-06-11 (×4): 750 mg via INTRAVENOUS
  Filled 2013-06-10 (×7): qty 150

## 2013-06-10 MED ORDER — BISACODYL 10 MG RE SUPP
10.0000 mg | Freq: Two times a day (BID) | RECTAL | Status: DC
  Administered 2013-06-10: 10 mg via RECTAL
  Filled 2013-06-10 (×3): qty 1

## 2013-06-10 NOTE — Progress Notes (Signed)
Subjective: Slightly better, but still complains of some dysphagia.  Objective: Vital signs in last 24 hours: Temp:  [98.2 F (36.8 C)-99.3 F (37.4 C)] 98.4 F (36.9 C) (03/13 1610) Pulse Rate:  [66-90] 88 (03/13 0632) Resp:  [20-21] 21 (03/13 9604) BP: (102-122)/(45-74) 122/74 mmHg (03/13 0632) SpO2:  [94 %-98 %] 98 % (03/13 5409) Last BM Date: 06/10/13  Intake/Output from previous day: 03/12 0701 - 03/13 0700 In: 2482.5 [P.O.:360; I.V.:1872.5; IV Piggyback:250] Out: -  Intake/Output this shift:    General appearance: alert and cooperative Neck: Incision healing well. 30 cc of serous fluid aspirated. No subcutaneous air noted.  Lab Results:   Recent Labs  06/09/13 0513 06/10/13 0505  WBC 17.6* 14.1*  HGB 8.3* 8.2*  HCT 24.6* 24.8*  PLT 227 242   BMET  Recent Labs  06/09/13 0513 06/10/13 0505  NA 139 139  K 3.9 3.8  CL 100 101  CO2 25 21  GLUCOSE 150* 140*  BUN 29* 28*  CREATININE 1.04 0.81  CALCIUM 8.1* 8.3*   PT/INR No results found for this basename: LABPROT, INR,  in the last 72 hours  Studies/Results: Dg Chest 2 View  06/08/2013   CLINICAL DATA Fever.  Cough.  Short of breath.  EXAM CHEST  2 VIEW  COMPARISON DG CHEST 2V dated 09/12/2011  FINDINGS Cardiopericardial silhouette within normal limits. Mediastinal contours normal. Trachea midline. No airspace disease or effusion.  IMPRESSION No active cardiopulmonary disease.  SIGNATURE  Electronically Signed   By: Andreas Newport M.D.   On: 06/08/2013 21:21   Ct Soft Tissue Neck W Contrast  06/08/2013   CLINICAL DATA Thyroidectomy 2 days prior with trouble swallowing.  EXAM CT NECK WITH CONTRAST  TECHNIQUE Multidetector CT imaging of the neck was performed using the standard protocol following the bolus administration of intravenous contrast.  CONTRAST 75mL OMNIPAQUE IOHEXOL 300 MG/ML  SOLN  COMPARISON None.  FINDINGS There is extensive fluid and gas collection within the thyroidectomy bed, measuring 7 x  3.3 x 7 cm. There is a rounded high attenuation area left of the trachea which could represent tissue or hematoma (hematoma favored given it is nearly completely surrounded by gas). Gas tracks through the strap muscles into the ventral neck, extending immediately subcutaneous. There is early extension towards the mediastinum. There is superior extension into the retropharyngeal is space. The airway remains patent. Venous structures patent. No visible tracheal or esophageal injury, although limited compared to esophagram.  No adenopathy. Unremarkable salivary glands. Clear apical lungs. No acute osseous findings.  Critical Value/emergent results were called by telephone at the time of interpretation on 06/08/2013 at 9:14 PM to Dr. Jaci Carrel , who verbally acknowledged these results.  IMPRESSION Large gas and fluid collection filling the thyroidectomy bed, extending into the subcutaneous neck. The fluid is complex density and may represent postoperative hemorrhage. Although sterility is indeterminate by imaging, leukocytosis is concerning for developing abscess.  SIGNATURE  Electronically Signed   By: Tiburcio Pea M.D.   On: 06/08/2013 21:26    Anti-infectives: Anti-infectives   Start     Dose/Rate Route Frequency Ordered Stop   06/09/13 1600  vancomycin (VANCOCIN) 1,250 mg in sodium chloride 0.9 % 250 mL IVPB     1,250 mg 166.7 mL/hr over 90 Minutes Intravenous Every 24 hours 06/08/13 2304     06/08/13 2215  vancomycin (VANCOCIN) IVPB 1000 mg/200 mL premix     1,000 mg 200 mL/hr over 60 Minutes Intravenous  Once 06/08/13 2207  06/08/13 2348      Assessment/Plan: Impression: Status post total thyroidectomy, dysphasia. Voice within normal limits. White blood cell count improving. We'll send fluid off for culture. Will review CT scan of the neck with radiology. Continue current therapy.  LOS: 2 days    Calandria Mullings A 06/10/2013

## 2013-06-10 NOTE — Care Management Note (Unsigned)
    Page 1 of 1   06/10/2013     9:06:17 AM   CARE MANAGEMENT NOTE 06/10/2013  Patient:  Linda Riggs,Linda Riggs   Account Number:  [id number]  Date Initiated:  06/09/2013  Documentation initiated by:  Anibal HendersonBOLDEN,Lenise Jr  Subjective/Objective Assessment:   Pt admitted from home with post-op pain and fever, following a thyroidectomy. She lives with significant other and will return home at D/C     Action/Plan:   Will follow for needs   Anticipated DC Date:  06/11/2013   Anticipated DC Plan:  HOME/SELF CARE      DC Planning Services  CM consult      Choice offered to / List presented to:             Status of service:  In process, will continue to follow Medicare Important Message given?   (If response is "NO", the following Medicare IM given date fields will be blank) Date Medicare IM given:   Date Additional Medicare IM given:    Discharge Disposition:    Per UR Regulation:    If discussed at Long Length of Stay Meetings, dates discussed:    Comments:  06/09/13 1600 Anibal HendersonGeneva Ishanvi Mcquitty RN/CM--- Late entry 06/10/13 [id number]

## 2013-06-10 NOTE — Progress Notes (Signed)
ANTIBIOTIC CONSULT NOTE - Follow Up  Pharmacy Consult for Vancomycin  Indication: wound infection  Allergies  Allergen Reactions  . Flonase [Fluticasone Propionate] Itching  . Prednisone Itching  . Tramadol Itching   Patient Measurements: Height: 5\' 2"  (157.5 cm) Weight: 154 lb 14.4 oz (70.262 kg) IBW/kg (Calculated) : 50.1  Vital Signs: Temp: 98.4 F (36.9 C) (03/13 16100632) Temp src: Oral (03/13 96040632) BP: 122/74 mmHg (03/13 54090632) Pulse Rate: 88 (03/13 0632) Intake/Output from previous day: 03/12 0701 - 03/13 0700 In: 2482.5 [P.O.:360; I.V.:1872.5; IV Piggyback:250] Out: -  Intake/Output from this shift: Total I/O In: 120 [P.O.:120] Out: -   Labs:  Recent Labs  06/08/13 1907 06/09/13 0513 06/10/13 0505  WBC 21.6* 17.6* 14.1*  HGB 9.9* 8.3* 8.2*  PLT 248 227 242  CREATININE 1.00 1.04 0.81   Estimated Creatinine Clearance: 69.6 ml/min (by C-G formula based on Cr of 0.81). No results found for this basename: VANCOTROUGH, Leodis BinetVANCOPEAK, VANCORANDOM, GENTTROUGH, GENTPEAK, GENTRANDOM, TOBRATROUGH, TOBRAPEAK, TOBRARND, AMIKACINPEAK, AMIKACINTROU, AMIKACIN,  in the last 72 hours   Microbiology: Recent Results (from the past 720 hour(s))  CULTURE, BLOOD (ROUTINE X 2)     Status: None   Collection Time    06/08/13  8:19 PM      Result Value Ref Range Status   Specimen Description BLOOD LEFT HAND   Final   Special Requests     Final   Value: BOTTLES DRAWN AEROBIC AND ANAEROBIC AEB=4CC ANA=6CC   Culture NO GROWTH 2 DAYS   Final   Report Status PENDING   Incomplete  CULTURE, BLOOD (ROUTINE X 2)     Status: None   Collection Time    06/08/13  8:19 PM      Result Value Ref Range Status   Specimen Description BLOOD LEFT FOREARM   Final   Special Requests BOTTLES DRAWN AEROBIC AND ANAEROBIC 6CC EACH   Final   Culture NO GROWTH 2 DAYS   Final   Report Status PENDING   Incomplete   Medical History: Past Medical History  Diagnosis Date  . Depression   . Fibromyalgia   .  Arthritis   . Type 2 diabetes mellitus   . Iron deficiency anemia     Dr. Arlyce DiceKaplan 11/10  . GERD (gastroesophageal reflux disease)   . Chronic headaches   . Diverticulosis   . Internal hemorrhoids   . Goiter    Assessment: 58 yo F s/p thyroidectomy on 3/9.  She was discharged home 3/10.  She represents to ED with difficulty swallowing.   Her WBC is improving and she is afebrile currently.  Renal function is at patient's baseline and stable.  Estimated Creatinine Clearance: 69.6 ml/min (by C-G formula based on Cr of 0.81).  Vancomycin 3/11>>  Goal of Therapy:  Vancomycin trough level 10-15 mcg/ml  Plan:   Vancomycin 750mg  IV q12h  Check Vancomycin trough at steady state  Monitor renal function and cx data   Valrie HartHall, Mirna Sutcliffe A 06/10/2013,10:39 AM

## 2013-06-10 NOTE — Progress Notes (Signed)
UR chart review completed.  

## 2013-06-10 NOTE — Progress Notes (Signed)
Pt unable to take medications this morning. Pt is still complaining of dysphagia and refused all pills even when crushed in applesauce. Pt did receive medication if it was IV. Will continue to monitor.

## 2013-06-11 LAB — GLUCOSE, CAPILLARY
GLUCOSE-CAPILLARY: 167 mg/dL — AB (ref 70–99)
GLUCOSE-CAPILLARY: 182 mg/dL — AB (ref 70–99)
GLUCOSE-CAPILLARY: 134 mg/dL — AB (ref 70–99)
Glucose-Capillary: 197 mg/dL — ABNORMAL HIGH (ref 70–99)
Glucose-Capillary: 171 mg/dL — ABNORMAL HIGH (ref 70–99)
Glucose-Capillary: 130 mg/dL — ABNORMAL HIGH (ref 70–99)

## 2013-06-11 LAB — CBC
HCT: 24.7 % — ABNORMAL LOW (ref 36.0–46.0)
HEMOGLOBIN: 8.2 g/dL — AB (ref 12.0–15.0)
MCH: 27.7 pg (ref 26.0–34.0)
MCHC: 33.2 g/dL (ref 30.0–36.0)
MCV: 83.4 fL (ref 78.0–100.0)
Platelets: 247 10*3/uL (ref 150–400)
RBC: 2.96 MIL/uL — ABNORMAL LOW (ref 3.87–5.11)
RDW: 14.9 % (ref 11.5–15.5)
WBC: 8.7 10*3/uL (ref 4.0–10.5)

## 2013-06-11 MED ORDER — LEVOTHYROXINE SODIUM 75 MCG PO TABS
75.0000 ug | ORAL_TABLET | Freq: Every day | ORAL | Status: DC
  Administered 2013-06-11 – 2013-06-12 (×2): 75 ug via ORAL
  Filled 2013-06-11: qty 1

## 2013-06-11 NOTE — Progress Notes (Signed)
  Subjective: Is starting to swallow better. Still complains of incisional pain.  Objective: Vital signs in last 24 hours: Temp:  [98.2 F (36.8 C)-98.5 F (36.9 C)] 98.3 F (36.8 C) (03/14 0535) Pulse Rate:  [70-86] 70 (03/14 0535) Resp:  [20] 20 (03/14 0535) BP: (122-135)/(43-77) 135/61 mmHg (03/14 0535) SpO2:  [95 %-98 %] 98 % (03/14 0535) Last BM Date: 06/10/13  Intake/Output from previous day: 03/13 0701 - 03/14 0700 In: 1176.3 [P.O.:360; I.V.:666.3; IV Piggyback:150] Out: -  Intake/Output this shift:    General appearance: alert, cooperative and no distress Neck: Decreased swelling noted. Incision healing well. Fluid aspirated from wound, serous in nature.  Lab Results:   Recent Labs  06/10/13 0505 06/11/13 0621  WBC 14.1* 8.7  HGB 8.2* 8.2*  HCT 24.8* 24.7*  PLT 242 247   BMET  Recent Labs  06/09/13 0513 06/10/13 0505  NA 139 139  K 3.9 3.8  CL 100 101  CO2 25 21  GLUCOSE 150* 140*  BUN 29* 28*  CREATININE 1.04 0.81  CALCIUM 8.1* 8.3*   PT/INR No results found for this basename: LABPROT, INR,  in the last 72 hours  Studies/Results: No results found.  Anti-infectives: Anti-infectives   Start     Dose/Rate Route Frequency Ordered Stop   06/10/13 1100  vancomycin (VANCOCIN) IVPB 750 mg/150 ml premix     750 mg 150 mL/hr over 60 Minutes Intravenous Every 12 hours 06/10/13 1043     06/09/13 1600  vancomycin (VANCOCIN) 1,250 mg in sodium chloride 0.9 % 250 mL IVPB  Status:  Discontinued     1,250 mg 166.7 mL/hr over 90 Minutes Intravenous Every 24 hours 06/08/13 2304 06/10/13 1042   06/08/13 2215  vancomycin (VANCOCIN) IVPB 1000 mg/200 mL premix     1,000 mg 200 mL/hr over 60 Minutes Intravenous  Once 06/08/13 2207 06/08/13 2348      Assessment/Plan: Impression: Status post total thyroidectomy with postoperative hematoma/seroma. Leukocytosis has resolved. Patient starting to swallow better. We'll continue IV vancomycin. Anticipate discharge  in next 24-48 hours once patient is able to establish that her by mouth intake is adequate.  LOS: 3 days    Brendan Gadson A 06/11/2013

## 2013-06-11 NOTE — Progress Notes (Signed)
Dr. Lovell SheehanJenkins notified that patient's anterior neck incision line draining moderate amount of serosanguineous fluid, alert and oriented, able to swallow without difficulty. vital signs stable, afebrile 98.3, patient continues on IV ABT, 4x4 gauze applied secured with  paper tape.

## 2013-06-12 LAB — WOUND CULTURE: SPECIAL REQUESTS: NORMAL

## 2013-06-12 LAB — GLUCOSE, CAPILLARY: Glucose-Capillary: 122 mg/dL — ABNORMAL HIGH (ref 70–99)

## 2013-06-12 MED ORDER — AMOXICILLIN-POT CLAVULANATE 875-125 MG PO TABS: 1.0000 | ORAL_TABLET | Freq: Two times a day (BID) | ORAL | Status: DC

## 2013-06-12 NOTE — Discharge Summary (Signed)
Physician Discharge Summary  Patient ID: Linda Riggs MRN: [id number] DOB/AGE: 58/11/1955 58 y.o.  Admit date: 06/08/2013 Discharge date: 06/12/2013  Admission Diagnoses: Postoperative seroma, neck, status post total thyroidectomy  Discharge Diagnoses: Same Active Problems:   Postoperative wound hematoma   Discharged Condition: good  Hospital Course: Patient is a 58 year old white female status post total thyroidectomy on 06/06/2013 who presented on 06/08/2001 with worsening dysphasia and neck swelling. CT scan of the neck revealed a seroma/hematoma in the soft tissue of the neck where the thyroid had been removed. Needle aspiration revealed serosanguineous fluid present. No airway compromise was noted. There was no esophageal injury. The patient was started on IV vancomycin. Final cultures revealed a group C. streptococcus. Her leukocytosis has resolved. Dysphasia has resolved. She is doing well with bedside aspiration. This has been decreasing in amount. The patient is being discharged home on 06/12/2013 in good improving condition. Final pathology from the thyroid tissue reveals multinodular goiter.   Discharge Exam: Blood pressure 142/52, pulse 64, temperature 98.1 F (36.7 C), temperature source Oral, resp. rate 20, height 5\' 2"  (1.575 m), weight 70.262 kg (154 lb 14.4 oz), SpO2 98.00%. General appearance: alert, cooperative and no distress Neck: Mild erythema noted. Incision intact. No significant swelling present after needle aspiration. Resp: clear to auscultation bilaterally Cardio: regular rate and rhythm, S1, S2 normal, no murmur, click, rub or gallop  Disposition: 01-Home or Self Care     Medication List         alendronate 70 MG tablet  Commonly known as:  FOSAMAX  Take 70 mg by mouth every 7 (seven) days. Take with a full glass of water on an empty stomach.     ALPRAZolam 1 MG tablet  Commonly known as:  XANAX  Take 1 mg by mouth at bedtime as needed for sleep.      amoxicillin-clavulanate 875-125 MG per tablet  Commonly known as:  AUGMENTIN  Take 1 tablet by mouth 2 (two) times daily.     atorvastatin 80 MG tablet  Commonly known as:  LIPITOR  Take 80 mg by mouth daily.     CALCIUM 600 + MINERALS 600-200 MG-UNIT Tabs  Take 1 tablet by mouth daily.     celecoxib 200 MG capsule  Commonly known as:  CELEBREX  Take 200 mg by mouth 2 (two) times daily.     DULoxetine 30 MG capsule  Commonly known as:  CYMBALTA  Take 30 mg by mouth daily.     escitalopram 20 MG tablet  Commonly known as:  LEXAPRO  Take 20 mg by mouth daily.     esomeprazole 40 MG capsule  Commonly known as:  NEXIUM  Take 40 mg by mouth daily before breakfast.     fenofibrate 145 MG tablet  Commonly known as:  TRICOR  Take 145 mg by mouth daily.     ferrous fumarate 325 (106 FE) MG Tabs tablet  Commonly known as:  HEMOCYTE - 106 mg FE  Take 1 tablet by mouth 3 (three) times daily.     fexofenadine 180 MG tablet  Commonly known as:  ALLEGRA  Take 180 mg by mouth daily.     HYDROcodone-acetaminophen 5-325 MG per tablet  Commonly known as:  NORCO/VICODIN  Take 1-2 tablets by mouth every 6 (six) hours as needed. pain     INVOKANA 300 MG Tabs  Generic drug:  Canagliflozin  Take 1 tablet by mouth daily.     levothyroxine 75 MCG tablet  Commonly known as:  SYNTHROID, LEVOTHROID  Take 75 mcg by mouth daily before breakfast.     metFORMIN 500 MG tablet  Commonly known as:  GLUCOPHAGE  Take 500 mg by mouth 2 (two) times daily with a meal.     oxyCODONE-acetaminophen 7.5-325 MG per tablet  Commonly known as:  PERCOCET  Take 1-2 tablets by mouth every 4 (four) hours as needed for pain.     potassium gluconate 595 MG Tabs tablet  Take 595 mg by mouth daily.     quinapril 10 MG tablet  Commonly known as:  ACCUPRIL  Take 10 mg by mouth at bedtime.     traZODone 100 MG tablet  Commonly known as:  DESYREL  Take 100 mg by mouth at bedtime.     triamcinolone 55  MCG/ACT Aero nasal inhaler  Commonly known as:  NASACORT  Place 1 spray into the nose daily.           Follow-up Information   Follow up with Dalia Heading, MD. Schedule an appointment as soon as possible for a visit on 06/14/2013.   Specialty:  General Surgery   Contact information:   [address] [place] [address] [phone]       Signed: Franky Macho A 06/12/2013, 10:25 AM

## 2013-06-12 NOTE — Progress Notes (Signed)
Patient states understanding of discharge instructions, prescription given. 

## 2013-06-13 LAB — CULTURE, BLOOD (ROUTINE X 2)
CULTURE: NO GROWTH
Culture: NO GROWTH

## 2013-06-13 NOTE — Progress Notes (Signed)
UR chart review completed.  

## 2013-09-13 ENCOUNTER — Other Ambulatory Visit: Payer: Self-pay | Admitting: Neurosurgery

## 2013-09-13 DIAGNOSIS — M5126 Other intervertebral disc displacement, lumbar region: Secondary | ICD-10-CM

## 2013-09-19 ENCOUNTER — Other Ambulatory Visit: Payer: Self-pay | Admitting: Neurosurgery

## 2013-09-19 ENCOUNTER — Ambulatory Visit
Admission: RE | Admit: 2013-09-19 | Discharge: 2013-09-19 | Disposition: A | Discharge status: Home / Self Care | Payer: Medicare Other | Source: Ambulatory Visit | Attending: Neurosurgery | Admitting: Neurosurgery

## 2013-09-19 DIAGNOSIS — M5126 Other intervertebral disc displacement, lumbar region: Secondary | ICD-10-CM

## 2013-09-19 DIAGNOSIS — M545 Low back pain, unspecified: Secondary | ICD-10-CM

## 2013-09-19 MED ORDER — METHYLPREDNISOLONE ACETATE 40 MG/ML INJ SUSP (RADIOLOG
120.0000 mg | Freq: Once | INTRAMUSCULAR | Status: AC
  Administered 2013-09-19: 120 mg via EPIDURAL

## 2013-09-19 MED ORDER — IOHEXOL 180 MG/ML  SOLN
1.0000 mL | Freq: Once | INTRAMUSCULAR | Status: AC | PRN
  Administered 2013-09-19: 1 mL via EPIDURAL

## 2013-09-19 NOTE — Discharge Instructions (Signed)

## 2013-10-03 ENCOUNTER — Ambulatory Visit
Admission: RE | Admit: 2013-10-03 | Discharge: 2013-10-03 | Disposition: A | Discharge status: Home / Self Care | Payer: Medicare Other | Source: Ambulatory Visit | Attending: Neurosurgery | Admitting: Neurosurgery

## 2013-10-03 DIAGNOSIS — M545 Low back pain, unspecified: Secondary | ICD-10-CM

## 2013-10-03 DIAGNOSIS — M5126 Other intervertebral disc displacement, lumbar region: Secondary | ICD-10-CM

## 2013-10-06 DIAGNOSIS — M810 Age-related osteoporosis without current pathological fracture: Secondary | ICD-10-CM | POA: Insufficient documentation

## 2013-12-01 ENCOUNTER — Encounter: Payer: Self-pay | Admitting: Gastroenterology

## 2014-01-16 DIAGNOSIS — F411 Generalized anxiety disorder: Secondary | ICD-10-CM | POA: Insufficient documentation

## 2014-01-16 DIAGNOSIS — G8929 Other chronic pain: Secondary | ICD-10-CM | POA: Insufficient documentation

## 2014-01-17 ENCOUNTER — Encounter: Payer: Self-pay | Admitting: Gastroenterology

## 2014-02-15 ENCOUNTER — Ambulatory Visit (AMBULATORY_SURGERY_CENTER): Payer: Self-pay

## 2014-02-15 VITALS — Ht 62.0 in | Wt 165.2 lb

## 2014-02-15 DIAGNOSIS — Z86010 Personal history of colonic polyps: Secondary | ICD-10-CM

## 2014-02-15 NOTE — Progress Notes (Signed)
Pt came into the office today for her pre-visit prior to her colonoscopy with Dr Arlyce DiceKaplan on 03/07/14.Pt states she has been having nausea past eating certain foods and wants to know if she can have an endo with the colonoscopy. The pt is scheduled to see her PCP and will discuss with her doctor about scheduling an endo/colon at the same time.The colonoscopy on 03/07/14 was cancelled per pt's request. The pt will call back if she has further questions.

## 2014-03-07 ENCOUNTER — Encounter: Payer: Medicare Other | Admitting: Gastroenterology

## 2014-03-31 HISTORY — PX: ESOPHAGOGASTRODUODENOSCOPY: SHX1529

## 2014-03-31 HISTORY — PX: COLONOSCOPY: SHX174

## 2014-06-28 ENCOUNTER — Other Ambulatory Visit (HOSPITAL_COMMUNITY): Payer: Self-pay | Admitting: Family Medicine

## 2014-06-28 DIAGNOSIS — Z1231 Encounter for screening mammogram for malignant neoplasm of breast: Secondary | ICD-10-CM

## 2014-07-03 ENCOUNTER — Ambulatory Visit (HOSPITAL_COMMUNITY)
Admission: RE | Admit: 2014-07-03 | Discharge: 2014-07-03 | Disposition: A | Discharge status: Home / Self Care | Payer: Medicare Other | Source: Ambulatory Visit | Attending: Family Medicine | Admitting: Family Medicine

## 2014-07-03 DIAGNOSIS — Z1231 Encounter for screening mammogram for malignant neoplasm of breast: Secondary | ICD-10-CM | POA: Diagnosis not present

## 2014-07-04 ENCOUNTER — Encounter: Payer: Self-pay | Admitting: Gastroenterology

## 2014-09-20 ENCOUNTER — Encounter: Payer: Self-pay | Admitting: Gastroenterology

## 2014-09-20 ENCOUNTER — Ambulatory Visit: Payer: Medicare Other | Admitting: Gastroenterology

## 2014-09-20 ENCOUNTER — Ambulatory Visit (INDEPENDENT_AMBULATORY_CARE_PROVIDER_SITE_OTHER): Payer: Medicare Other | Admitting: Gastroenterology

## 2014-09-20 ENCOUNTER — Other Ambulatory Visit (INDEPENDENT_AMBULATORY_CARE_PROVIDER_SITE_OTHER): Payer: Medicare Other

## 2014-09-20 VITALS — BP 128/80 | HR 96 | Ht 62.0 in

## 2014-09-20 DIAGNOSIS — Z860100 Personal history of colon polyps, unspecified: Secondary | ICD-10-CM | POA: Insufficient documentation

## 2014-09-20 DIAGNOSIS — D509 Iron deficiency anemia, unspecified: Secondary | ICD-10-CM

## 2014-09-20 DIAGNOSIS — R1314 Dysphagia, pharyngoesophageal phase: Secondary | ICD-10-CM

## 2014-09-20 DIAGNOSIS — R1310 Dysphagia, unspecified: Secondary | ICD-10-CM | POA: Diagnosis not present

## 2014-09-20 LAB — CBC WITH DIFFERENTIAL/PLATELET
Basophils Absolute: 0.1 10*3/uL (ref 0.0–0.1)
Basophils Relative: 0.8 % (ref 0.0–3.0)
EOS PCT: 1.7 % (ref 0.0–5.0)
Eosinophils Absolute: 0.1 10*3/uL (ref 0.0–0.7)
HEMATOCRIT: 31.3 % — AB (ref 36.0–46.0)
HEMOGLOBIN: 10.1 g/dL — AB (ref 12.0–15.0)
LYMPHS ABS: 1.7 10*3/uL (ref 0.7–4.0)
Lymphocytes Relative: 22.6 % (ref 12.0–46.0)
MCHC: 32.2 g/dL (ref 30.0–36.0)
MCV: 76.8 fl — ABNORMAL LOW (ref 78.0–100.0)
MONOS PCT: 4.5 % (ref 3.0–12.0)
Monocytes Absolute: 0.3 10*3/uL (ref 0.1–1.0)
NEUTROS ABS: 5.4 10*3/uL (ref 1.4–7.7)
Neutrophils Relative %: 70.4 % (ref 43.0–77.0)
PLATELETS: 409 10*3/uL — AB (ref 150.0–400.0)
RBC: 4.08 Mil/uL (ref 3.87–5.11)
RDW: 19.6 % — ABNORMAL HIGH (ref 11.5–15.5)
WBC: 7.6 10*3/uL (ref 4.0–10.5)

## 2014-09-20 MED ORDER — NA SULFATE-K SULFATE-MG SULF 17.5-3.13-1.6 GM/177ML PO SOLN: 1.0000 | Freq: Once | ORAL | Status: DC

## 2014-09-20 NOTE — Assessment & Plan Note (Signed)
Dysphagia to solids.  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    Rectal mediations  #1 upper endoscopy with dilation as indicated  CC Dr. Lysbeth Galas

## 2014-09-20 NOTE — Assessment & Plan Note (Signed)
Diminutive colon polyp removed in 2010 but not retrieved.  Recommendations #1 colonoscopy

## 2014-09-20 NOTE — Progress Notes (Signed)
_                                                                                                                History of Present Illness:  Linda Riggs is a 59 year old white female with history of colon polyps, esophageal stricture, iron deficiency anemia, referred for evaluation of dysphagia and for colonoscopy.  In 2010 she went upper and lower endoscopy for an iron deficiency anemia.  After stricture was seen and dilated.  A diminutive colon polyp was removed.  Polyp was not successfully retrieved.  The patient complains of pyrosis which is fairly well-controlled with Nexium.  She complains of recurrent dysphagia to solids.  There is no history of change of bowel habits or rectal bleeding.   Past Medical History  Diagnosis Date  . Depression   . Fibromyalgia   . Arthritis   . Type 2 diabetes mellitus   . Iron deficiency anemia     Dr. Arlyce Dice 11/10  . GERD (gastroesophageal reflux disease)   . Chronic headaches   . Diverticulosis   . Internal hemorrhoids   . Goiter   . Osteoporosis   . Ruptured lumbar disc    Past Surgical History  Procedure Laterality Date  . Arthroscopic left knee surgery Bilateral   . Dilation and curettage of uterus    . Thyroidectomy N/A 06/06/2013    Procedure: TOTAL THYROIDECTOMY;  Surgeon: Dalia Heading, MD;  Location: [facility];  Service: General;  Laterality: N/A;   family history includes Alzheimer's disease in her mother; Cancer in her father; Diabetes in her brother, sister, and sister; Diabetes type II in her father, mother, and sister. Current Outpatient Prescriptions  Medication Sig Dispense Refill  . alendronate (FOSAMAX) 70 MG tablet Take 70 mg by mouth every 7 (seven) days. Take with a full glass of water on an empty stomach.     Marland Kitchen atorvastatin (LIPITOR) 80 MG tablet Take 80 mg by mouth daily.    . Calcium Carbonate-Vit D-Min (CALCIUM 600 + MINERALS) 600-200 MG-UNIT TABS Take 1 tablet by mouth daily.     . Canagliflozin  (INVOKANA) 300 MG TABS Take 1 tablet by mouth daily.     Marland Kitchen escitalopram (LEXAPRO) 20 MG tablet Take 20 mg by mouth daily.      Marland Kitchen esomeprazole (NEXIUM) 40 MG capsule Take 40 mg by mouth daily before breakfast.      . fenofibrate (TRICOR) 145 MG tablet Take 145 mg by mouth daily.    . fexofenadine (ALLEGRA) 180 MG tablet Take 180 mg by mouth daily.      Marland Kitchen levothyroxine (SYNTHROID, LEVOTHROID) 75 MCG tablet Take 75 mcg by mouth daily before breakfast.    . metFORMIN (GLUCOPHAGE) 500 MG tablet Take 500 mg by mouth 2 (two) times daily with a meal.      . potassium gluconate 595 MG TABS Take 595 mg by mouth daily.     . traZODone (DESYREL) 100 MG tablet Take 100 mg by mouth at bedtime. Take 3  at bedtime     No current facility-administered medications for this visit.   Allergies as of 09/20/2014 - Review Complete 09/20/2014  Allergen Reaction Noted  . Aspirin Itching 09/20/2014  . Flonase [fluticasone propionate] Itching 09/06/2010  . Prednisone Itching 09/06/2010  . Tramadol Itching 09/06/2010    reports that she has been smoking Cigarettes.  She has a 10 pack-year smoking history. She has never used smokeless tobacco. She reports that she does not drink alcohol or use illicit drugs.   Review of Systems: Pertinent positive and negative review of systems were noted in the above HPI section. All other review of systems were otherwise negative.  Vital signs were reviewed in today's medical record Physical Exam: General: Well developed , well nourished, no acute distress Skin: anicteric Head: Normocephalic and atraumatic Eyes:  sclerae anicteric, EOMI Ears: Normal auditory acuity Mouth: No deformity or lesions Neck: Supple, no masses or thyromegaly Lymph Nodes: no lymphadenopathy Lungs: Clear throughout to auscultation Heart: Regular rate and rhythm; no murmurs, rubs or bruits Gastroinestinal: Soft, non tender and non distended. No masses, hepatosplenomegaly or hernias noted. Normal Bowel  sounds Rectal:deferred Musculoskeletal: Symmetrical with no gross deformities  Skin: No lesions on visible extremities Pulses:  Normal pulses noted Extremities: No clubbing, cyanosis, edema or deformities noted Neurological: Alert oriented x 4, grossly nonfocal Cervical Nodes:  No significant cervical adenopathy Inguinal Nodes: No significant inguinal adenopathy Psychological:  Alert and cooperative. Normal mood and affect  See Assessment and Plan under Problem List

## 2014-09-20 NOTE — Patient Instructions (Signed)
Go to the basement for labs today Pick up IFOB Hemoccult kit today  You have been scheduled for an endoscopy. Please follow written instructions given to you at your visit today. If you use inhalers (even only as needed), please bring them with you on the day of your procedure. Your physician has requested that you go to www.[email or web address].com and enter the access code given to you at your visit today. This web site gives a general overview about your procedure. However, you should still follow specific instructions given to you by our office regarding your preparation for the procedure.  You have been scheduled for a colonoscopy. Please follow written instructions given to you at your visit today.  Please pick up your prep supplies at the pharmacy within the next 1-3 days. If you use inhalers (even only as needed), please bring them with you on the day of your procedure. Your physician has requested that you go to www.[email or web address].com and enter the access code given to you at your visit today. This web site gives a general overview about your procedure. However, you should still follow specific instructions given to you by our office regarding your preparation for the procedure.

## 2014-09-20 NOTE — Assessment & Plan Note (Signed)
History of iron deficiency anemia.  Endoscopic studies were negative for a GI bleeding source.  Hemoccults were requested but were not done.  Recommendations #1 check CBC and Hemoccults

## 2014-09-20 NOTE — Assessment & Plan Note (Signed)
Symptoms are well controlled with Nexium.  Plan to continue with the same. 

## 2014-11-21 ENCOUNTER — Encounter: Payer: Self-pay | Admitting: Gastroenterology

## 2014-11-21 ENCOUNTER — Ambulatory Visit (AMBULATORY_SURGERY_CENTER): Payer: Medicare Other | Admitting: Gastroenterology

## 2014-11-21 VITALS — BP 130/58 | HR 68 | Temp 99.1°F | Resp 17 | Ht 62.0 in | Wt 170.0 lb

## 2014-11-21 DIAGNOSIS — R1310 Dysphagia, unspecified: Secondary | ICD-10-CM

## 2014-11-21 DIAGNOSIS — R1314 Dysphagia, pharyngoesophageal phase: Secondary | ICD-10-CM | POA: Diagnosis not present

## 2014-11-21 DIAGNOSIS — K222 Esophageal obstruction: Secondary | ICD-10-CM | POA: Diagnosis not present

## 2014-11-21 DIAGNOSIS — R1319 Other dysphagia: Secondary | ICD-10-CM

## 2014-11-21 MED ORDER — SODIUM CHLORIDE 0.9 % IV SOLN: 500.0000 mL | INTRAVENOUS | Status: DC

## 2014-11-21 NOTE — Progress Notes (Signed)
Report to PACU, RN, vss, BBS= Clear.  

## 2014-11-21 NOTE — Patient Instructions (Signed)
YOU HAD AN ENDOSCOPIC PROCEDURE TODAY AT THE [facility]:   Refer to the procedure report that was given to you for any specific questions about what was found during the examination.  If the procedure report does not answer your questions, please call your gastroenterologist to clarify.  If you requested that your care partner not be given the details of your procedure findings, then the procedure report has been included in a sealed envelope for you to review at your convenience later.  YOU SHOULD EXPECT: Some feelings of bloating in the abdomen. Passage of more gas than usual.  Walking can help get rid of the air that was put into your GI tract during the procedure and reduce the bloating. If you had a lower endoscopy (such as a colonoscopy or flexible sigmoidoscopy) you may notice spotting of blood in your stool or on the toilet paper. If you underwent a bowel prep for your procedure, you may not have a normal bowel movement for a few days.  Please Note:  You might notice some irritation and congestion in your nose or some drainage.  This is from the oxygen used during your procedure.  There is no need for concern and it should clear up in a day or so.  SYMPTOMS TO REPORT IMMEDIATELY:   Following upper endoscopy (EGD)  Vomiting of blood or coffee ground material  New chest pain or pain under the shoulder blades  Painful or persistently difficult swallowing  New shortness of breath  Fever of 100F or higher  Black, tarry-looking stools  For urgent or emergent issues, a gastroenterologist can be reached at any hour by calling [phone].   DIET: Your first meal following the procedure should be a small meal and then it is ok to progress to your normal diet. Heavy or fried foods are harder to digest and may make you feel nauseous or bloated.  Likewise, meals heavy in dairy and vegetables can increase bloating.  Drink plenty of fluids but you should avoid alcoholic beverages for  24 hours.  ACTIVITY:  You should plan to take it easy for the rest of today and you should NOT DRIVE or use heavy machinery until tomorrow (because of the sedation medicines used during the test).    FOLLOW UP: Our staff will call the number listed on your records the next business day following your procedure to check on you and address any questions or concerns that you may have regarding the information given to you following your procedure. If we do not reach you, we will leave a message.  However, if you are feeling well and you are not experiencing any problems, there is no need to return our call.  We will assume that you have returned to your regular daily activities without incident.  If any biopsies were taken you will be contacted by phone or by letter within the next 1-3 weeks.  Please call us at [phone] if you have not heard about the biopsies in 3 weeks.    SIGNATURES/CONFIDENTIALITY: You and/or your care partner have signed paperwork which will be entered into your electronic medical record.  These signatures attest to the fact that that the information above on your After Visit Summary has been reviewed and is understood.  Full responsibility of the confidentiality of this discharge information lies with you and/or your care-partner.  Repeat EGD as needed

## 2014-11-21 NOTE — Progress Notes (Signed)
Called to room to assist during endoscopic procedure.  Patient ID and intended procedure confirmed with present staff. Received instructions for my participation in the procedure from the performing physician.  

## 2014-11-21 NOTE — Op Note (Signed)
[facility] [address] [place], [address]   ENDOSCOPY PROCEDURE REPORT  PATIENT: Linda Riggs, Linda Riggs  MR#: [id number] BIRTHDATE: [birth year] , 59  yrs. old GENDER: female ENDOSCOPIST: Louis Meckel, MD REFERRED BY:  Joette Catching, M.D. PROCEDURE DATE:  11/21/2014 PROCEDURE:  EGD, diagnostic and Maloney dilation of esophagus ASA CLASS:     Class II INDICATIONS:  dysphagia. MEDICATIONS: Monitored anesthesia care and Propofol 140 mg IV TOPICAL ANESTHETIC:  DESCRIPTION OF PROCEDURE: After the risks benefits and alternatives of the procedure were thoroughly explained, informed consent was obtained.  The LB [id number] endoscope was introduced through the mouth and advanced to the second portion of the duodenum , Without limitations.  The instrument was slowly withdrawn as the mucosa was fully examined.    ESOPHAGUS: There was a peptic and benign appearing stricture at the gastroesophageal junction.  The stricture was easily traversable. The stricture was dilated using a 17mm (51Fr) Maloney dilator. The stricture was dilated using a 18mm (54Fr) Maloney dilator. Except for the findings listed, the EGD was otherwise normal. Retroflexed views revealed no abnormalities.     The scope was then withdrawn from the patient and the procedure completed.  COMPLICATIONS: There were no immediate complications.  ENDOSCOPIC IMPRESSION: 1.   There was an early stricture at the gastroesophageal junction; The stricture was dilated using a 18mm (52Fr) Maloney dilator  3.   EGD was otherwise normal  RECOMMENDATIONS: Repeat dilation as needed  REPEAT EXAM:  eSigned:  Louis Meckel, MD 11/21/2014 2:48 PM    CC:

## 2014-11-22 ENCOUNTER — Telehealth: Payer: Self-pay | Admitting: *Deleted

## 2014-11-22 NOTE — Telephone Encounter (Signed)
Message left

## 2014-12-06 ENCOUNTER — Ambulatory Visit (AMBULATORY_SURGERY_CENTER): Payer: Medicare Other | Admitting: Gastroenterology

## 2014-12-06 ENCOUNTER — Encounter: Payer: Self-pay | Admitting: Gastroenterology

## 2014-12-06 ENCOUNTER — Other Ambulatory Visit: Payer: Self-pay

## 2014-12-06 VITALS — BP 120/63 | HR 60 | Temp 97.5°F | Resp 13 | Ht 62.0 in

## 2014-12-06 DIAGNOSIS — D509 Iron deficiency anemia, unspecified: Secondary | ICD-10-CM | POA: Diagnosis not present

## 2014-12-06 DIAGNOSIS — Z860100 Personal history of colon polyps, unspecified: Secondary | ICD-10-CM

## 2014-12-06 DIAGNOSIS — K5730 Diverticulosis of large intestine without perforation or abscess without bleeding: Secondary | ICD-10-CM

## 2014-12-06 MED ORDER — SODIUM CHLORIDE 0.9 % IV SOLN: 500.0000 mL | INTRAVENOUS | Status: DC

## 2014-12-06 NOTE — Patient Instructions (Signed)
YOU HAD AN ENDOSCOPIC PROCEDURE TODAY AT THE [facility]:   Refer to the procedure report that was given to you for any specific questions about what was found during the examination.  If the procedure report does not answer your questions, please call your gastroenterologist to clarify.  If you requested that your care partner not be given the details of your procedure findings, then the procedure report has been included in a sealed envelope for you to review at your convenience later.  YOU SHOULD EXPECT: Some feelings of bloating in the abdomen. Passage of more gas than usual.  Walking can help get rid of the air that was put into your GI tract during the procedure and reduce the bloating. If you had a lower endoscopy (such as a colonoscopy or flexible sigmoidoscopy) you may notice spotting of blood in your stool or on the toilet paper. If you underwent a bowel prep for your procedure, you may not have a normal bowel movement for a few days.  Please Note:  You might notice some irritation and congestion in your nose or some drainage.  This is from the oxygen used during your procedure.  There is no need for concern and it should clear up in a day or so.  SYMPTOMS TO REPORT IMMEDIATELY:   Following lower endoscopy (colonoscopy or flexible sigmoidoscopy):  Excessive amounts of blood in the stool  Significant tenderness or worsening of abdominal pains  Swelling of the abdomen that is new, acute  Fever of 100F or higher   For urgent or emergent issues, a gastroenterologist can be reached at any hour by calling [phone].   DIET: Your first meal following the procedure should be a small meal and then it is ok to progress to your normal diet. Heavy or fried foods are harder to digest and may make you feel nauseous or bloated.  Likewise, meals heavy in dairy and vegetables can increase bloating.  Drink plenty of fluids but you should avoid alcoholic beverages for 24  hours.  ACTIVITY:  You should plan to take it easy for the rest of today and you should NOT DRIVE or use heavy machinery until tomorrow (because of the sedation medicines used during the test).    FOLLOW UP: Our staff will call the number listed on your records the next business day following your procedure to check on you and address any questions or concerns that you may have regarding the information given to you following your procedure. If we do not reach you, we will leave a message.  However, if you are feeling well and you are not experiencing any problems, there is no need to return our call.  We will assume that you have returned to your regular daily activities without incident.  If any biopsies were taken you will be contacted by phone or by letter within the next 1-3 weeks.  Please call us at [phone] if you have not heard about the biopsies in 3 weeks.    SIGNATURES/CONFIDENTIALITY: You and/or your care partner have signed paperwork which will be entered into your electronic medical record.  These signatures attest to the fact that that the information above on your After Visit Summary has been reviewed and is understood.  Full responsibility of the confidentiality of this discharge information lies with you and/or your care-partner.  Resume medications. Information given on diverticulosis, high fiber diet.  Stool Hemoccults cards given.

## 2014-12-06 NOTE — Progress Notes (Signed)
Transferred to recovery room. A/O x3, pleased with MAC.  VSS.  Report to Shelia, RN. 

## 2014-12-06 NOTE — Op Note (Signed)
[facility] [address] [place], [address]   COLONOSCOPY PROCEDURE REPORT  PATIENT: Linda Riggs, Linda Riggs  MR#: [id number] BIRTHDATE: 1955-09-25 , 59  yrs. old GENDER: female ENDOSCOPIST: Louis Meckel, MD REFERRED NW:[id number] Lysbeth Galas, M.D. PROCEDURE DATE:  12/06/2014 PROCEDURE:   Colonoscopy, diagnostic and Colonoscopy, surveillance First Screening Colonoscopy - Avg.  risk and is 50 yrs.  old or older - No.  Prior Negative Screening - Now for repeat screening. N/A  History of Adenoma - Now for follow-up colonoscopy & has been > or = to 3 yrs.  Yes hx of adenoma.  Has been 3 or more years since last colonoscopy.  Polyps removed today? No ASA CLASS:   Class II INDICATIONS:high risk patient with personal history of colonic polyps and Unexplained iron deficiency anemia. MEDICATIONS: Monitored anesthesia care and Propofol 200 mg IV  DESCRIPTION OF PROCEDURE:   After the risks benefits and alternatives of the procedure were thoroughly explained, informed consent was obtained.  The digital rectal exam revealed no abnormalities of the rectum.   The LB [id number]  endoscope was introduced through the anus and advanced to the cecum, which was identified by both the appendix and ileocecal valve. No adverse events experienced.   The quality of the prep was (Suprep was used) excellent.  The instrument was then slowly withdrawn as the colon was fully examined. Estimated blood loss is zero unless otherwise noted in this procedure report.      COLON FINDINGS: There was mild diverticulosis noted in the ascending colon.   There was mild diverticulosis noted in the descending colon.  Retroflexed views revealed no abnormalities. The time to cecum = 3.8 Withdrawal time = 6.2   The scope was withdrawn and the procedure completed. COMPLICATIONS: There were no immediate complications.  ENDOSCOPIC IMPRESSION: 1.   Mild diverticulosis was noted in the ascending colon 2.    There was mild diverticulosis noted in the descending colon  RECOMMENDATIONS: Stool Hemoccults in 7-10 days Colonoscopy 10 years  eSigned:  Louis Meckel, MD 12/06/2014 10:52 AM   cc:

## 2014-12-07 ENCOUNTER — Telehealth: Payer: Self-pay

## 2014-12-07 NOTE — Telephone Encounter (Signed)
Left a message at [phone] for the pt to call back if any questions or concerns. maw

## 2015-02-06 ENCOUNTER — Other Ambulatory Visit: Payer: Self-pay | Admitting: "Endocrinology

## 2015-02-28 ENCOUNTER — Ambulatory Visit: Payer: Self-pay | Admitting: "Endocrinology

## 2015-03-07 ENCOUNTER — Encounter: Payer: Self-pay | Admitting: "Endocrinology

## 2015-03-07 ENCOUNTER — Ambulatory Visit (INDEPENDENT_AMBULATORY_CARE_PROVIDER_SITE_OTHER): Payer: Medicare Other | Admitting: "Endocrinology

## 2015-03-07 VITALS — BP 124/60 | HR 73 | Ht 62.0 in | Wt 175.0 lb

## 2015-03-07 DIAGNOSIS — E785 Hyperlipidemia, unspecified: Secondary | ICD-10-CM | POA: Insufficient documentation

## 2015-03-07 DIAGNOSIS — E890 Postprocedural hypothyroidism: Secondary | ICD-10-CM

## 2015-03-07 DIAGNOSIS — I10 Essential (primary) hypertension: Secondary | ICD-10-CM | POA: Diagnosis not present

## 2015-03-07 DIAGNOSIS — E118 Type 2 diabetes mellitus with unspecified complications: Secondary | ICD-10-CM | POA: Diagnosis not present

## 2015-03-07 DIAGNOSIS — Z794 Long term (current) use of insulin: Secondary | ICD-10-CM

## 2015-03-07 DIAGNOSIS — E1165 Type 2 diabetes mellitus with hyperglycemia: Secondary | ICD-10-CM | POA: Diagnosis not present

## 2015-03-07 DIAGNOSIS — IMO0002 Reserved for concepts with insufficient information to code with codable children: Secondary | ICD-10-CM

## 2015-03-07 MED ORDER — METFORMIN HCL ER 500 MG PO TB24: 500.0000 mg | ORAL_TABLET | Freq: Two times a day (BID) | ORAL | Status: DC

## 2015-03-07 MED ORDER — CANAGLIFLOZIN 100 MG PO TABS: 100.0000 mg | ORAL_TABLET | Freq: Every day | ORAL | Status: DC

## 2015-03-07 MED ORDER — INSULIN DETEMIR 100 UNIT/ML FLEXPEN: 40.0000 [IU] | PEN_INJECTOR | Freq: Every day | SUBCUTANEOUS | Status: DC

## 2015-03-07 MED ORDER — LEVOTHYROXINE SODIUM 125 MCG PO TABS: 125.0000 ug | ORAL_TABLET | Freq: Every morning | ORAL | Status: DC

## 2015-03-07 NOTE — Patient Instructions (Signed)

## 2015-03-07 NOTE — Progress Notes (Signed)
Subjective:    Patient ID: Linda Riggs, female    DOB: 02/04/1956, PCP Josue HectorNYLAND,LEONARD ROBERT, MD   Past Medical History  Diagnosis Date  . Depression   . Fibromyalgia   . Arthritis   . Type 2 diabetes mellitus (HCC)   . Iron deficiency anemia     Dr. Arlyce DiceKaplan 11/10  . GERD (gastroesophageal reflux disease)   . Chronic headaches   . Diverticulosis   . Internal hemorrhoids   . Goiter   . Osteoporosis   . Ruptured lumbar disc    Past Surgical History  Procedure Laterality Date  . Arthroscopic left knee surgery Bilateral   . Dilation and curettage of uterus    . Thyroidectomy N/A 06/06/2013    Procedure: TOTAL THYROIDECTOMY;  Surgeon: Dalia HeadingMark A Jenkins, MD;  Location: [facility];  Service: General;  Laterality: N/A;   Social History   Social History  . Marital Status: Divorced    Spouse Name: N/A  . Number of Children: N/A  . Years of Education: N/A   Social History Main Topics  . Smoking status: Current Every Day Smoker -- 0.50 packs/day for 20 years    Types: Cigarettes  . Smokeless tobacco: Never Used  . Alcohol Use: No  . Drug Use: No  . Sexual Activity: Yes    Birth Control/ Protection: Post-menopausal   Other Topics Concern  . None   Social History Narrative   Outpatient Encounter Prescriptions as of 03/07/2015  Medication Sig  . alendronate (FOSAMAX) 70 MG tablet Take 70 mg by mouth every 7 (seven) days. Take with a full glass of water on an empty stomach.   . escitalopram (LEXAPRO) 20 MG tablet Take 20 mg by mouth daily.    . fenofibrate (TRICOR) 145 MG tablet Take 145 mg by mouth daily.  . fexofenadine (ALLEGRA) 180 MG tablet Take 180 mg by mouth daily.    . Insulin Detemir (LEVEMIR FLEXTOUCH) 100 UNIT/ML Pen Inject 40 Units into the skin at bedtime.  Marland Kitchen. levothyroxine (SYNTHROID, LEVOTHROID) 125 MCG tablet Take 1 tablet (125 mcg total) by mouth every morning.  . metFORMIN (GLUCOPHAGE-XR) 500 MG 24 hr tablet Take 1 tablet (500 mg total) by mouth 2 (two) times  daily.  . traZODone (DESYREL) 100 MG tablet Take 100 mg by mouth at bedtime. Take 3 at bedtime  . [DISCONTINUED] esomeprazole (NEXIUM) 40 MG capsule Take 40 mg by mouth daily before breakfast.    . [DISCONTINUED] Insulin Detemir (LEVEMIR FLEXTOUCH [place]) Inject 40 Units into the skin at bedtime.  . [DISCONTINUED] levothyroxine (SYNTHROID, LEVOTHROID) 125 MCG tablet TAKE ONE TABLET EVERY MORNING  . [DISCONTINUED] metFORMIN (GLUCOPHAGE-XR) 500 MG 24 hr tablet TAKE ONE TABLET BY MOUTH TWICE DAILY  . atorvastatin (LIPITOR) 80 MG tablet Take 80 mg by mouth daily.  . Calcium Carbonate-Vit D-Min (CALCIUM 600 + MINERALS) 600-200 MG-UNIT TABS Take 1 tablet by mouth daily.   . canagliflozin (INVOKANA) 100 MG TABS tablet Take 1 tablet (100 mg total) by mouth daily before breakfast.  . potassium gluconate 595 MG TABS Take 595 mg by mouth daily.   . [DISCONTINUED] Canagliflozin (INVOKANA) 300 MG TABS Take 1 tablet by mouth daily.    No facility-administered encounter medications on file as of 03/07/2015.   ALLERGIES: Allergies  Allergen Reactions  . Aspirin Itching  . Flonase [Fluticasone Propionate] Itching  . Prednisone Itching  . Tramadol Itching   VACCINATION STATUS:  There is no immunization history on file for this patient.  Diabetes She presents for her follow-up diabetic visit. She has type 2 diabetes mellitus. Her disease course has been stable. Pertinent negatives for hypoglycemia include no confusion, headaches, pallor or seizures. Associated symptoms include fatigue. Pertinent negatives for diabetes include no chest pain, no polydipsia, no polyphagia and no polyuria. Symptoms are stable. There are no diabetic complications. Risk factors for coronary artery disease include diabetes mellitus, dyslipidemia, tobacco exposure, sedentary lifestyle and hypertension. Current diabetic treatment includes insulin injections and oral agent (dual therapy). She is compliant with treatment most of the time.  Her weight is stable. She is following a generally unhealthy diet. When asked about meal planning, she reported none. She rarely participates in exercise. Her overall blood glucose range is 140-180 mg/dl. An ACE inhibitor/angiotensin II receptor blocker is being taken.  Thyroid Problem Presents for follow-up visit. Symptoms include fatigue. Patient reports no cold intolerance, diarrhea, heat intolerance or palpitations. The symptoms have been stable. Past treatments include levothyroxine. Prior procedures include thyroidectomy. Her past medical history is significant for diabetes and hyperlipidemia.  Hypertension This is a chronic problem. The current episode started more than 1 year ago. Pertinent negatives include no chest pain, headaches, palpitations or shortness of breath. Past treatments include ACE inhibitors. Hypertensive end-organ damage includes a thyroid problem.  Hyperlipidemia This is a chronic problem. The current episode started more than 1 year ago. Exacerbating diseases include diabetes. Pertinent negatives include no chest pain, myalgias or shortness of breath. Current antihyperlipidemic treatment includes statins. Risk factors for coronary artery disease include diabetes mellitus, dyslipidemia and hypertension.     Review of Systems  Constitutional: Positive for fatigue. Negative for unexpected weight change.  HENT: Negative for trouble swallowing and voice change.   Eyes: Negative for visual disturbance.  Respiratory: Negative for cough, shortness of breath and wheezing.   Cardiovascular: Negative for chest pain, palpitations and leg swelling.  Gastrointestinal: Negative for nausea, vomiting and diarrhea.  Endocrine: Negative for cold intolerance, heat intolerance, polydipsia, polyphagia and polyuria.  Musculoskeletal: Negative for myalgias and arthralgias.  Skin: Negative for color change, pallor, rash and wound.  Neurological: Negative for seizures and headaches.   Psychiatric/Behavioral: Negative for suicidal ideas and confusion.    Objective:    BP 124/60 mmHg  Pulse 73  Ht  (1.575 m)  Wt 175 lb (79.379 kg)  BMI 32.00 kg/m2  SpO2 97%  Wt Readings from Last 3 Encounters:  03/07/15 175 lb (79.379 kg)  11/21/14 170 lb (77.111 kg)  02/15/14 165 lb 3.2 oz (74.934 kg)    Physical Exam  Constitutional: She is oriented to person, place, and time. She appears well-developed.  HENT:  Head: Normocephalic and atraumatic.  Eyes: EOM are normal.  Neck: Normal range of motion. Neck supple. No tracheal deviation present. No thyromegaly present.  Cardiovascular: Normal rate and regular rhythm.   Pulmonary/Chest: Effort normal and breath sounds normal.  Abdominal: Soft. Bowel sounds are normal. There is no tenderness. There is no guarding.  Musculoskeletal: Normal range of motion. She exhibits no edema.  Neurological: She is alert and oriented to person, place, and time. She has normal reflexes. No cranial nerve deficit. Coordination normal.  Skin: Skin is warm and dry. No rash noted. No erythema. No pallor.  Psychiatric: She has a normal mood and affect. Judgment normal.     Complete Blood Count (Most recent):  Chemistry (most recent): Lab Results  Component Value Date   NA 139 06/10/2013   K 3.8 06/10/2013   CL 101 06/10/2013  CO2 21 06/10/2013   BUN 28* 06/10/2013   CREATININE 0.81 06/10/2013   Diabetic Labs (most recent): Lab Results  Component Value Date   HGBA1C 8.2* 06/06/2013     Assessment & Plan:   1. Uncontrolled type 2 diabetes mellitus with complication, with long-term current use of insulin (HCC) - patient remains at a high risk for more acute and chronic complications of diabetes which include CAD, CVA, CKD, retinopathy, and neuropathy. These are all discussed in detail with the patient.  Patient came with controlled fasting glucose profile, and  recent A1c of 8.2% unchanged from last visit..  Glucose logs and  insulin administration records pertaining to this visit,  to be scanned into patient's records.  Recent labs reviewed.   - I have re-counseled the patient on diet management   by adopting a carbohydrate restricted / protein rich  Diet.  - Suggestion is made for patient to avoid simple carbohydrates   from their diet including Cakes , Desserts, Ice Cream,  Soda (  diet and regular) , Sweet Tea , Candies,  Chips, Cookies, Artificial Sweeteners,   and "Sugar-free" Products .  This will help patient to have stable blood glucose profile and potentially avoid unintended  Weight gain.  - Patient is advised to stick to a routine mealtimes to eat 3 meals  a day and avoid unnecessary snacks ( to snack only to correct hypoglycemia).   - I have approached patient with the following individualized plan to manage diabetes and patient agrees.  - I will proceed with basal insulin Levemir 40 units QHS, associated with strict monitoring of glucose  AC breakfast   -Patient is encouraged to call clinic for blood glucose levels less than 70 or above 300 mg /dl. - I will continue metformin 500 mg by mouth twice a day and Invokana 100 mg by mouth every morning, therapeutically suitable for patient.  - Patient specific target  for A1c; LDL, HDL, Triglycerides, and  Waist Circumference were discussed in detail.  2) BP/HTN: Controlled. Continue current medications including ACEI/ARB. 3) Lipids/HPL:  continue statins.  4)  Postsurgical hypothyroidism -She is on levothyroxine 125 g by mouth every morning. Her thyroid function tests are consistent with appropriate replacement, she is clinically euthyroid. I advised her to continue on the same dose.  - We discussed about correct intake of levothyroxine, at fasting, with water, separated by at least 30 minutes from breakfast, and separated by more than 4 hours from calcium, iron, multivitamins, acid reflux medications (PPIs). -Patient is made aware of the fact that  thyroid hormone replacement is needed for life, dose to be adjusted by periodic monitoring of thyroid function tests.  5)  Weight/Diet:  exercise, and carbohydrates information provided.  6) Chronic Care/Health Maintenance:  -Patient is on ACEI/ARB and Statin medications and encouraged to continue to follow up with Ophthalmology, Podiatrist at least yearly or according to recommendations, and advised to quit smoking. I have recommended yearly flu vaccine and pneumonia vaccination at least every 5 years; moderate intensity exercise for up to 150 minutes weekly; and  sleep for at least 7 hours a day.  - 25 minutes of time was spent on the care of this patient , 50% of which was applied for counseling on diabetes complications and their preventions.  - I advised patient to maintain close follow up with Josue Hector, MD for primary care needs.  Patient is asked to bring meter and  blood glucose logs during their next visit.  Follow up plan: -Return in about 3 months (around 06/05/2015) for diabetes, underactive thyroid, high cholesterol, high blood pressure, follow up with pre-visit labs, meter, and logs.  Marquis Lunch, MD Phone: [phone]  Fax: [phone]   03/07/2015, 11:24 AM

## 2015-04-16 DIAGNOSIS — G4700 Insomnia, unspecified: Secondary | ICD-10-CM | POA: Insufficient documentation

## 2015-05-29 ENCOUNTER — Other Ambulatory Visit (HOSPITAL_COMMUNITY): Payer: Self-pay | Admitting: Family Medicine

## 2015-05-29 DIAGNOSIS — Z1231 Encounter for screening mammogram for malignant neoplasm of breast: Secondary | ICD-10-CM

## 2015-05-31 ENCOUNTER — Other Ambulatory Visit: Payer: Self-pay | Admitting: "Endocrinology

## 2015-05-31 LAB — BASIC METABOLIC PANEL
BUN: 23 mg/dL (ref 7–25)
CALCIUM: 8.9 mg/dL (ref 8.6–10.4)
CO2: 20 mmol/L (ref 20–31)
CREATININE: 0.97 mg/dL (ref 0.50–0.99)
Chloride: 106 mmol/L (ref 98–110)
Glucose, Bld: 116 mg/dL — ABNORMAL HIGH (ref 65–99)
POTASSIUM: 4.4 mmol/L (ref 3.5–5.3)
Sodium: 138 mmol/L (ref 135–146)

## 2015-05-31 LAB — TSH: TSH: 0.83 m[IU]/L

## 2015-05-31 LAB — HEMOGLOBIN A1C
Hgb A1c MFr Bld: 8.2 % — ABNORMAL HIGH (ref <5.7)
Mean Plasma Glucose: 189 mg/dL — ABNORMAL HIGH (ref <117)

## 2015-05-31 LAB — T4, FREE: FREE T4: 1.2 ng/dL (ref 0.8–1.8)

## 2015-06-07 ENCOUNTER — Encounter: Payer: Self-pay | Admitting: "Endocrinology

## 2015-06-07 ENCOUNTER — Ambulatory Visit (INDEPENDENT_AMBULATORY_CARE_PROVIDER_SITE_OTHER): Payer: Medicare Other | Admitting: "Endocrinology

## 2015-06-07 VITALS — BP 149/77 | HR 79 | Ht 62.0 in | Wt 172.0 lb

## 2015-06-07 DIAGNOSIS — E118 Type 2 diabetes mellitus with unspecified complications: Secondary | ICD-10-CM | POA: Diagnosis not present

## 2015-06-07 DIAGNOSIS — E1165 Type 2 diabetes mellitus with hyperglycemia: Secondary | ICD-10-CM

## 2015-06-07 DIAGNOSIS — E890 Postprocedural hypothyroidism: Secondary | ICD-10-CM

## 2015-06-07 DIAGNOSIS — I10 Essential (primary) hypertension: Secondary | ICD-10-CM

## 2015-06-07 DIAGNOSIS — E785 Hyperlipidemia, unspecified: Secondary | ICD-10-CM | POA: Diagnosis not present

## 2015-06-07 DIAGNOSIS — Z794 Long term (current) use of insulin: Secondary | ICD-10-CM

## 2015-06-07 DIAGNOSIS — IMO0002 Reserved for concepts with insufficient information to code with codable children: Secondary | ICD-10-CM

## 2015-06-07 MED ORDER — INSULIN DETEMIR 100 UNIT/ML FLEXPEN: 50.0000 [IU] | PEN_INJECTOR | Freq: Every day | SUBCUTANEOUS | Status: DC

## 2015-06-07 NOTE — Patient Instructions (Signed)

## 2015-06-07 NOTE — Progress Notes (Signed)
Subjective:    Patient ID: Linda Riggs, female    DOB: 24-Apr-1955, PCP Josue Hector, MD   Past Medical History  Diagnosis Date  . Depression   . Fibromyalgia   . Arthritis   . Type 2 diabetes mellitus (HCC)   . Iron deficiency anemia     Dr. Arlyce Dice 11/10  . GERD (gastroesophageal reflux disease)   . Chronic headaches   . Diverticulosis   . Internal hemorrhoids   . Goiter   . Osteoporosis   . Ruptured lumbar disc    Past Surgical History  Procedure Laterality Date  . Arthroscopic left knee surgery Bilateral   . Dilation and curettage of uterus    . Thyroidectomy N/A 06/06/2013    Procedure: TOTAL THYROIDECTOMY;  Surgeon: Dalia Heading, MD;  Location: [facility];  Service: General;  Laterality: N/A;   Social History   Social History  . Marital Status: Divorced    Spouse Name: N/A  . Number of Children: N/A  . Years of Education: N/A   Social History Main Topics  . Smoking status: Current Every Day Smoker -- 0.50 packs/day for 20 years    Types: Cigarettes  . Smokeless tobacco: Never Used  . Alcohol Use: No  . Drug Use: No  . Sexual Activity: Yes    Birth Control/ Protection: Post-menopausal   Other Topics Concern  . None   Social History Narrative   Outpatient Encounter Prescriptions as of 06/07/2015  Medication Sig  . alendronate (FOSAMAX) 70 MG tablet Take 70 mg by mouth every 7 (seven) days. Take with a full glass of water on an empty stomach.   Marland Kitchen atorvastatin (LIPITOR) 80 MG tablet Take 80 mg by mouth daily.  . Calcium Carbonate-Vit D-Min (CALCIUM 600 + MINERALS) 600-200 MG-UNIT TABS Take 1 tablet by mouth daily.   . canagliflozin (INVOKANA) 100 MG TABS tablet Take 1 tablet (100 mg total) by mouth daily before breakfast.  . escitalopram (LEXAPRO) 20 MG tablet Take 20 mg by mouth daily.    . fenofibrate (TRICOR) 145 MG tablet Take 145 mg by mouth daily.  . fexofenadine (ALLEGRA) 180 MG tablet Take 180 mg by mouth daily.    Marland Kitchen gabapentin  (NEURONTIN) 300 MG capsule Take 300 mg by mouth 3 (three) times daily.  . Insulin Detemir (LEVEMIR FLEXTOUCH) 100 UNIT/ML Pen Inject 50 Units into the skin daily at 10 pm.  . levothyroxine (SYNTHROID, LEVOTHROID) 125 MCG tablet Take 1 tablet (125 mcg total) by mouth every morning.  . metFORMIN (GLUCOPHAGE-XR) 500 MG 24 hr tablet Take 1 tablet (500 mg total) by mouth 2 (two) times daily.  . potassium gluconate 595 MG TABS Take 595 mg by mouth daily.   . quinapril (ACCUPRIL) 20 MG tablet Take 20 mg by mouth at bedtime.  . traZODone (DESYREL) 100 MG tablet Take 100 mg by mouth at bedtime. Take 3 at bedtime  . [DISCONTINUED] Insulin Detemir (LEVEMIR FLEXTOUCH) 100 UNIT/ML Pen Inject 40 Units into the skin at bedtime.   No facility-administered encounter medications on file as of 06/07/2015.   ALLERGIES: Allergies  Allergen Reactions  . Aspirin Itching  . Flonase [Fluticasone Propionate] Itching  . Prednisone Itching  . Tramadol Itching   VACCINATION STATUS:  There is no immunization history on file for this patient.  Diabetes She presents for her follow-up diabetic visit. She has type 2 diabetes mellitus. Her disease course has been stable. Pertinent negatives for hypoglycemia include no confusion, headaches, pallor or  seizures. Associated symptoms include fatigue. Pertinent negatives for diabetes include no chest pain, no polydipsia, no polyphagia and no polyuria. Symptoms are stable. There are no diabetic complications. Risk factors for coronary artery disease include diabetes mellitus, dyslipidemia, tobacco exposure, sedentary lifestyle and hypertension. Current diabetic treatment includes insulin injections and oral agent (dual therapy). She is compliant with treatment most of the time. Her weight is stable. She is following a generally unhealthy diet. When asked about meal planning, she reported none. She rarely participates in exercise. Her breakfast blood glucose range is generally 140-180  mg/dl. An ACE inhibitor/angiotensin II receptor blocker is being taken.  Thyroid Problem Presents for follow-up visit. Symptoms include fatigue. Patient reports no cold intolerance, diarrhea, heat intolerance or palpitations. The symptoms have been stable. Past treatments include levothyroxine. Prior procedures include thyroidectomy. Her past medical history is significant for diabetes and hyperlipidemia.  Hypertension This is a chronic problem. The current episode started more than 1 year ago. Pertinent negatives include no chest pain, headaches, palpitations or shortness of breath. Past treatments include ACE inhibitors. Hypertensive end-organ damage includes a thyroid problem.  Hyperlipidemia This is a chronic problem. The current episode started more than 1 year ago. Exacerbating diseases include diabetes. Pertinent negatives include no chest pain, myalgias or shortness of breath. Current antihyperlipidemic treatment includes statins. Risk factors for coronary artery disease include diabetes mellitus, dyslipidemia and hypertension.     Review of Systems  Constitutional: Positive for fatigue. Negative for unexpected weight change.  HENT: Negative for trouble swallowing and voice change.   Eyes: Negative for visual disturbance.  Respiratory: Negative for cough, shortness of breath and wheezing.   Cardiovascular: Negative for chest pain, palpitations and leg swelling.  Gastrointestinal: Negative for nausea, vomiting and diarrhea.  Endocrine: Negative for cold intolerance, heat intolerance, polydipsia, polyphagia and polyuria.  Musculoskeletal: Negative for myalgias and arthralgias.  Skin: Negative for color change, pallor, rash and wound.  Neurological: Negative for seizures and headaches.  Psychiatric/Behavioral: Negative for suicidal ideas and confusion.    Objective:    BP 149/77 mmHg  Pulse 79  Ht 5\' 2"  (1.575 m)  Wt 172 lb (78.019 kg)  BMI 31.45 kg/m2  SpO2 97%  Wt Readings from  Last 3 Encounters:  06/07/15 172 lb (78.019 kg)  03/07/15 175 lb (79.379 kg)  11/21/14 170 lb (77.111 kg)    Physical Exam  Constitutional: She is oriented to person, place, and time. She appears well-developed.  HENT:  Head: Normocephalic and atraumatic.  Eyes: EOM are normal.  Neck: Normal range of motion. Neck supple. No tracheal deviation present. No thyromegaly present.  Cardiovascular: Normal rate and regular rhythm.   Pulmonary/Chest: Effort normal and breath sounds normal.  Abdominal: Soft. Bowel sounds are normal. There is no tenderness. There is no guarding.  Musculoskeletal: Normal range of motion. She exhibits no edema.  Neurological: She is alert and oriented to person, place, and time. She has normal reflexes. No cranial nerve deficit. Coordination normal.  Skin: Skin is warm and dry. No rash noted. No erythema. No pallor.  Psychiatric: She has a normal mood and affect. Judgment normal.     Complete Blood Count (Most recent):  Chemistry (most recent): Lab Results  Component Value Date   NA 138 05/31/2015   K 4.4 05/31/2015   CL 106 05/31/2015   CO2 20 05/31/2015   BUN 23 05/31/2015   CREATININE 0.97 05/31/2015   Diabetic Labs (most recent): Lab Results  Component Value Date   HGBA1C 8.2*  05/31/2015   HGBA1C 8.2* 06/06/2013     Assessment & Plan:   1. Uncontrolled type 2 diabetes mellitus with complication, with long-term current use of insulin (HCC) - patient remains at a high risk for more acute and chronic complications of diabetes which include CAD, CVA, CKD, retinopathy, and neuropathy. These are all discussed in detail with the patient.  Patient came with controlled fasting glucose profile, and  recent A1c of 8.2% unchanged from last visit..  Glucose logs and insulin administration records pertaining to this visit,  to be scanned into patient's records.  Recent labs reviewed and discussed with her.   - I have re-counseled the patient on diet  management   by adopting a carbohydrate restricted / protein rich  Diet.  - Suggestion is made for patient to avoid simple carbohydrates   from their diet including Cakes , Desserts, Ice Cream,  Soda (  diet and regular) , Sweet Tea , Candies,  Chips, Cookies, Artificial Sweeteners,   and "Sugar-free" Products .  This will help patient to have stable blood glucose profile and potentially avoid unintended  Weight gain.  - Patient is advised to stick to a routine mealtimes to eat 3 meals  a day and avoid unnecessary snacks ( to snack only to correct hypoglycemia).   - I have approached patient with the following individualized plan to manage diabetes and patient agrees.  - I will increase basal insulin Levemir to 50  units QHS, associated with strict monitoring of glucose  AC breakfast   -Patient is encouraged to call clinic for blood glucose levels less than 70 or above 300 mg /dl. - I will continue metformin 500 mg by mouth twice a day and Invokana 100 mg by mouth every morning, therapeutically suitable for patient.  - Patient specific target  for A1c; LDL, HDL, Triglycerides, and  Waist Circumference were discussed in detail.  2) BP/HTN: Controlled. Continue current medications including ACEI/ARB. 3) Lipids/HPL:  continue statins.  4)  Postsurgical hypothyroidism -She is on levothyroxine 125 g by mouth every morning. Her thyroid function tests are consistent with appropriate replacement, she is clinically euthyroid. I advised her to continue on the same dose.  - We discussed about correct intake of levothyroxine, at fasting, with water, separated by at least 30 minutes from breakfast, and separated by more than 4 hours from calcium, iron, multivitamins, acid reflux medications (PPIs). -Patient is made aware of the fact that thyroid hormone replacement is needed for life, dose to be adjusted by periodic monitoring of thyroid function tests.  5)  Weight/Diet:  exercise, and carbohydrates  information provided.  6) Chronic Care/Health Maintenance:  -Patient is on ACEI/ARB and Statin medications and encouraged to continue to follow up with Ophthalmology, Podiatrist at least yearly or according to recommendations, and advised to quit smoking. I have recommended yearly flu vaccine and pneumonia vaccination at least every 5 years; moderate intensity exercise for up to 150 minutes weekly; and  sleep for at least 7 hours a day.  - 25 minutes of time was spent on the care of this patient , 50% of which was applied for counseling on diabetes complications and their preventions.  - I advised patient to maintain close follow up with Josue Hector, MD for primary care needs.  Patient is asked to bring meter and  blood glucose logs during their next visit.   Follow up plan: -Return in about 3 months (around 09/07/2015) for diabetes, high blood pressure, high cholesterol, underactive thyroid,  follow up with pre-visit labs, meter, and logs.  Marquis Lunch, MD Phone: [phone]  Fax: [phone]   06/07/2015, 11:36 AM

## 2015-07-05 ENCOUNTER — Ambulatory Visit (HOSPITAL_COMMUNITY)
Admission: RE | Admit: 2015-07-05 | Discharge: 2015-07-05 | Disposition: A | Discharge status: Home / Self Care | Payer: Medicare Other | Source: Ambulatory Visit | Attending: Family Medicine | Admitting: Family Medicine

## 2015-07-05 DIAGNOSIS — Z1231 Encounter for screening mammogram for malignant neoplasm of breast: Secondary | ICD-10-CM

## 2015-07-16 DIAGNOSIS — M51369 Other intervertebral disc degeneration, lumbar region without mention of lumbar back pain or lower extremity pain: Secondary | ICD-10-CM | POA: Insufficient documentation

## 2015-07-16 DIAGNOSIS — M5136 Other intervertebral disc degeneration, lumbar region: Secondary | ICD-10-CM | POA: Insufficient documentation

## 2015-07-31 ENCOUNTER — Other Ambulatory Visit: Payer: Self-pay | Admitting: "Endocrinology

## 2015-09-06 ENCOUNTER — Other Ambulatory Visit: Payer: Self-pay | Admitting: "Endocrinology

## 2015-09-06 LAB — BASIC METABOLIC PANEL
BUN: 27 mg/dL — ABNORMAL HIGH (ref 7–25)
CHLORIDE: 103 mmol/L (ref 98–110)
CO2: 22 mmol/L (ref 20–31)
Calcium: 9.5 mg/dL (ref 8.6–10.4)
Creat: 1.21 mg/dL — ABNORMAL HIGH (ref 0.50–0.99)
Glucose, Bld: 185 mg/dL — ABNORMAL HIGH (ref 65–99)
POTASSIUM: 4.6 mmol/L (ref 3.5–5.3)
SODIUM: 138 mmol/L (ref 135–146)

## 2015-09-06 LAB — HEMOGLOBIN A1C
Hgb A1c MFr Bld: 9.4 % — ABNORMAL HIGH (ref <5.7)
Mean Plasma Glucose: 223 mg/dL

## 2015-09-13 ENCOUNTER — Ambulatory Visit (INDEPENDENT_AMBULATORY_CARE_PROVIDER_SITE_OTHER): Payer: Medicare Other | Admitting: "Endocrinology

## 2015-09-13 ENCOUNTER — Encounter: Payer: Self-pay | Admitting: "Endocrinology

## 2015-09-13 VITALS — BP 132/70 | HR 88 | Ht 62.0 in | Wt 172.0 lb

## 2015-09-13 DIAGNOSIS — E118 Type 2 diabetes mellitus with unspecified complications: Secondary | ICD-10-CM | POA: Diagnosis not present

## 2015-09-13 DIAGNOSIS — E1165 Type 2 diabetes mellitus with hyperglycemia: Secondary | ICD-10-CM | POA: Diagnosis not present

## 2015-09-13 DIAGNOSIS — E890 Postprocedural hypothyroidism: Secondary | ICD-10-CM | POA: Diagnosis not present

## 2015-09-13 DIAGNOSIS — I10 Essential (primary) hypertension: Secondary | ICD-10-CM

## 2015-09-13 DIAGNOSIS — Z794 Long term (current) use of insulin: Secondary | ICD-10-CM

## 2015-09-13 DIAGNOSIS — IMO0002 Reserved for concepts with insufficient information to code with codable children: Secondary | ICD-10-CM

## 2015-09-13 DIAGNOSIS — E785 Hyperlipidemia, unspecified: Secondary | ICD-10-CM | POA: Diagnosis not present

## 2015-09-13 MED ORDER — GLUCOSE BLOOD VI STRP: ORAL_STRIP | Status: DC

## 2015-09-13 NOTE — Patient Instructions (Signed)

## 2015-09-13 NOTE — Progress Notes (Signed)
Subjective:    Patient ID: Linda Riggs, female    DOB: Jul 31, 1955, PCP Josue Hector, MD   Past Medical History  Diagnosis Date  . Depression   . Fibromyalgia   . Arthritis   . Type 2 diabetes mellitus (HCC)   . Iron deficiency anemia     Dr. Arlyce Dice 11/10  . GERD (gastroesophageal reflux disease)   . Chronic headaches   . Diverticulosis   . Internal hemorrhoids   . Goiter   . Osteoporosis   . Ruptured lumbar disc    Past Surgical History  Procedure Laterality Date  . Arthroscopic left knee surgery Bilateral   . Dilation and curettage of uterus    . Thyroidectomy N/A 06/06/2013    Procedure: TOTAL THYROIDECTOMY;  Surgeon: Dalia Heading, MD;  Location: [facility];  Service: General;  Laterality: N/A;   Social History   Social History  . Marital Status: Divorced    Spouse Name: N/A  . Number of Children: N/A  . Years of Education: N/A   Social History Main Topics  . Smoking status: Current Every Day Smoker -- 0.50 packs/day for 20 years    Types: Cigarettes  . Smokeless tobacco: Never Used  . Alcohol Use: No  . Drug Use: No  . Sexual Activity: Yes    Birth Control/ Protection: Post-menopausal   Other Topics Concern  . None   Social History Narrative   Outpatient Encounter Prescriptions as of 09/13/2015  Medication Sig  . Insulin Detemir (LEVEMIR) 100 UNIT/ML Pen Inject 60 Units into the skin at bedtime.  Marland Kitchen alendronate (FOSAMAX) 70 MG tablet Take 70 mg by mouth every 7 (seven) days. Take with a full glass of water on an empty stomach.   Marland Kitchen atorvastatin (LIPITOR) 80 MG tablet Take 80 mg by mouth daily.  . Calcium Carbonate-Vit D-Min (CALCIUM 600 + MINERALS) 600-200 MG-UNIT TABS Take 1 tablet by mouth daily.   . canagliflozin (INVOKANA) 100 MG TABS tablet Take 1 tablet (100 mg total) by mouth daily before breakfast.  . escitalopram (LEXAPRO) 20 MG tablet Take 20 mg by mouth daily.    . fenofibrate (TRICOR) 145 MG tablet Take 145 mg by mouth daily.  .  fexofenadine (ALLEGRA) 180 MG tablet Take 180 mg by mouth daily.    Marland Kitchen gabapentin (NEURONTIN) 300 MG capsule Take 300 mg by mouth 3 (three) times daily.  Marland Kitchen glucose blood (ONE TOUCH TEST STRIPS) test strip Use as instructed  . levothyroxine (SYNTHROID, LEVOTHROID) 125 MCG tablet TAKE ONE TABLET EVERY MORNING  . metFORMIN (GLUCOPHAGE-XR) 500 MG 24 hr tablet TAKE ONE TABLET BY MOUTH TWICE DAILY  . potassium gluconate 595 MG TABS Take 595 mg by mouth daily.   . quinapril (ACCUPRIL) 20 MG tablet Take 20 mg by mouth at bedtime.  . traZODone (DESYREL) 100 MG tablet Take 100 mg by mouth at bedtime. Take 3 at bedtime  . [DISCONTINUED] Insulin Detemir (LEVEMIR FLEXTOUCH) 100 UNIT/ML Pen Inject 50 Units into the skin daily at 10 pm.   No facility-administered encounter medications on file as of 09/13/2015.   ALLERGIES: Allergies  Allergen Reactions  . Aspirin Itching  . Flonase [Fluticasone Propionate] Itching  . Prednisone Itching  . Tramadol Itching   VACCINATION STATUS:  There is no immunization history on file for this patient.  Diabetes She presents for her follow-up diabetic visit. She has type 2 diabetes mellitus. Her disease course has been stable. Pertinent negatives for hypoglycemia include no confusion, headaches,  pallor or seizures. Associated symptoms include fatigue. Pertinent negatives for diabetes include no chest pain, no polydipsia, no polyphagia and no polyuria. Symptoms are stable. There are no diabetic complications. Risk factors for coronary artery disease include diabetes mellitus, dyslipidemia, tobacco exposure, sedentary lifestyle and hypertension. Current diabetic treatment includes insulin injections and oral agent (dual therapy). She is compliant with treatment most of the time. Her weight is stable. She is following a generally unhealthy diet. When asked about meal planning, she reported none. She rarely participates in exercise. Her breakfast blood glucose range is generally  140-180 mg/dl. An ACE inhibitor/angiotensin II receptor blocker is being taken.  Thyroid Problem Presents for follow-up visit. Symptoms include fatigue. Patient reports no cold intolerance, diarrhea, heat intolerance or palpitations. The symptoms have been stable. Past treatments include levothyroxine. Prior procedures include thyroidectomy. Her past medical history is significant for diabetes and hyperlipidemia.  Hypertension This is a chronic problem. The current episode started more than 1 year ago. Pertinent negatives include no chest pain, headaches, palpitations or shortness of breath. Past treatments include ACE inhibitors. Hypertensive end-organ damage includes a thyroid problem.  Hyperlipidemia This is a chronic problem. The current episode started more than 1 year ago. Exacerbating diseases include diabetes. Pertinent negatives include no chest pain, myalgias or shortness of breath. Current antihyperlipidemic treatment includes statins. Risk factors for coronary artery disease include diabetes mellitus, dyslipidemia and hypertension.     Review of Systems  Constitutional: Positive for fatigue. Negative for unexpected weight change.  HENT: Negative for trouble swallowing and voice change.   Eyes: Negative for visual disturbance.  Respiratory: Negative for cough, shortness of breath and wheezing.   Cardiovascular: Negative for chest pain, palpitations and leg swelling.  Gastrointestinal: Negative for nausea, vomiting and diarrhea.  Endocrine: Negative for cold intolerance, heat intolerance, polydipsia, polyphagia and polyuria.  Musculoskeletal: Negative for myalgias and arthralgias.  Skin: Negative for color change, pallor, rash and wound.  Neurological: Negative for seizures and headaches.  Psychiatric/Behavioral: Negative for suicidal ideas and confusion.    Objective:    BP 132/70 mmHg  Pulse 88  Ht  (1.575 m)  Wt 172 lb (78.019 kg)  BMI 31.45 kg/m2  SpO2 97%  Wt  Readings from Last 3 Encounters:  09/13/15 172 lb (78.019 kg)  06/07/15 172 lb (78.019 kg)  03/07/15 175 lb (79.379 kg)    Physical Exam  Constitutional: She is oriented to person, place, and time. She appears well-developed.  HENT:  Head: Normocephalic and atraumatic.  Eyes: EOM are normal.  Neck: Normal range of motion. Neck supple. No tracheal deviation present. No thyromegaly present.  Cardiovascular: Normal rate and regular rhythm.   Pulmonary/Chest: Effort normal and breath sounds normal.  Abdominal: Soft. Bowel sounds are normal. There is no tenderness. There is no guarding.  Musculoskeletal: Normal range of motion. She exhibits no edema.  Neurological: She is alert and oriented to person, place, and time. She has normal reflexes. No cranial nerve deficit. Coordination normal.  Skin: Skin is warm and dry. No rash noted. No erythema. No pallor.  Psychiatric: She has a normal mood and affect. Judgment normal.     Chemistry (most recent): Lab Results  Component Value Date   NA 138 09/06/2015   K 4.6 09/06/2015   CL 103 09/06/2015   CO2 22 09/06/2015   BUN 27* 09/06/2015   CREATININE 1.21* 09/06/2015   Diabetic Labs (most recent): Lab Results  Component Value Date   HGBA1C 9.4* 09/06/2015   HGBA1C  8.2* 05/31/2015   HGBA1C 8.2* 06/06/2013     Assessment & Plan:   1. Uncontrolled type 2 diabetes mellitus with complication, with long-term current use of insulin (HCC) - patient remains at a high risk for more acute and chronic complications of diabetes which include CAD, CVA, CKD, retinopathy, and neuropathy. These are all discussed in detail with the patient.  Patient came with controlled fasting glucose profile, but  recent A1c of  9.4% increasing from 8.2% .   Glucose logs and insulin administration records pertaining to this visit,  to be scanned into patient's records.  Recent labs reviewed and discussed with her.   - I have re-counseled the patient on diet  management   by adopting a carbohydrate restricted / protein rich  Diet.  - Suggestion is made for patient to avoid simple carbohydrates   from their diet including Cakes , Desserts, Ice Cream,  Soda (  diet and regular) , Sweet Tea , Candies,  Chips, Cookies, Artificial Sweeteners,   and "Sugar-free" Products .  This will help patient to have stable blood glucose profile and potentially avoid unintended  Weight gain.  - Patient is advised to stick to a routine mealtimes to eat 3 meals  a day and avoid unnecessary snacks ( to snack only to correct hypoglycemia).   - I have approached patient with the following individualized plan to manage diabetes and patient agrees.  - I will increase basal insulin Levemir to 60  units QHS, associated with strict monitoring of glucose  AC and HS. - She will return in 1 week with her logs in meter. -If glycemic profile this significantly above target, she will be considered for prandial insulin.  -Patient is encouraged to call clinic for blood glucose levels less than 70 or above 300 mg /dl. - I will continue metformin 500 mg by mouth twice a day and Invokana 100 mg by mouth every morning, therapeutically suitable for patient.  - Patient specific target  for A1c; LDL, HDL, Triglycerides, and  Waist Circumference were discussed in detail.  2) BP/HTN: Controlled. Continue current medications including ACEI/ARB. 3) Lipids/HPL:  continue statins.  4)  Postsurgical hypothyroidism -She is on levothyroxine 125 g by mouth every morning. Her thyroid function tests are consistent with appropriate replacement, she is clinically euthyroid. I advised her to continue on the same dose.  - We discussed about correct intake of levothyroxine, at fasting, with water, separated by at least 30 minutes from breakfast, and separated by more than 4 hours from calcium, iron, multivitamins, acid reflux medications (PPIs). -Patient is made aware of the fact that thyroid hormone  replacement is needed for life, dose to be adjusted by periodic monitoring of thyroid function tests.  5)  Weight/Diet:  exercise, and carbohydrates information provided.  6) Chronic Care/Health Maintenance:  -Patient is on ACEI/ARB and Statin medications and encouraged to continue to follow up with Ophthalmology, Podiatrist at least yearly or according to recommendations, and advised to quit smoking. I have recommended yearly flu vaccine and pneumonia vaccination at least every 5 years; moderate intensity exercise for up to 150 minutes weekly; and  sleep for at least 7 hours a day.  - 25 minutes of time was spent on the care of this patient , 50% of which was applied for counseling on diabetes complications and their preventions.  - I advised patient to maintain close follow up with Josue Hector, MD for primary care needs.  Patient is asked to bring meter and  blood glucose logs during their next visit.   Follow up plan: -Return in about 1 week (around 09/20/2015) for follow up with meter and logs- no labs.  Marquis LunchGebre Dineen Conradt, MD Phone: [phone]  Fax: [phone]   09/13/2015, 1:55 PM

## 2015-09-17 ENCOUNTER — Other Ambulatory Visit: Payer: Self-pay | Admitting: "Endocrinology

## 2015-09-17 ENCOUNTER — Telehealth: Payer: Self-pay

## 2015-09-17 MED ORDER — FLUCONAZOLE 150 MG PO TABS: 150.0000 mg | ORAL_TABLET | Freq: Once | ORAL | Status: DC

## 2015-09-17 NOTE — Telephone Encounter (Signed)
Pt states she has had high BG readings.   Date Before breakfast Before lunch Before supper Bedtime  6/17 [phone] 363  6/18 152 259 318 289  6/19 128 201            Pt taking: Levemir 60units qhs, Invokana 100mg  qd, Metformin 500mg  bid.   Pt also says that she has a yeast infection from the Invokana and is requesting that we call something in for this.

## 2015-09-17 NOTE — Telephone Encounter (Signed)
Pt notified and agrees. 

## 2015-09-17 NOTE — Telephone Encounter (Signed)
Continue on current medications, I will send Diflucan prescription to her pharmacy. advise patient to keep her appointment.

## 2015-09-21 ENCOUNTER — Other Ambulatory Visit: Payer: Self-pay

## 2015-09-21 ENCOUNTER — Encounter: Payer: Self-pay | Admitting: "Endocrinology

## 2015-09-21 ENCOUNTER — Ambulatory Visit (INDEPENDENT_AMBULATORY_CARE_PROVIDER_SITE_OTHER): Payer: Medicare Other | Admitting: "Endocrinology

## 2015-09-21 VITALS — BP 120/86 | HR 91 | Ht 62.0 in | Wt 171.0 lb

## 2015-09-21 DIAGNOSIS — E1165 Type 2 diabetes mellitus with hyperglycemia: Secondary | ICD-10-CM

## 2015-09-21 DIAGNOSIS — E118 Type 2 diabetes mellitus with unspecified complications: Secondary | ICD-10-CM

## 2015-09-21 DIAGNOSIS — I10 Essential (primary) hypertension: Secondary | ICD-10-CM

## 2015-09-21 DIAGNOSIS — Z794 Long term (current) use of insulin: Secondary | ICD-10-CM

## 2015-09-21 DIAGNOSIS — E785 Hyperlipidemia, unspecified: Secondary | ICD-10-CM

## 2015-09-21 DIAGNOSIS — IMO0002 Reserved for concepts with insufficient information to code with codable children: Secondary | ICD-10-CM

## 2015-09-21 DIAGNOSIS — E890 Postprocedural hypothyroidism: Secondary | ICD-10-CM | POA: Diagnosis not present

## 2015-09-21 MED ORDER — INSULIN LISPRO 100 UNIT/ML (KWIKPEN): 10.0000 [IU] | PEN_INJECTOR | Freq: Three times a day (TID) | SUBCUTANEOUS | Status: DC

## 2015-09-21 MED ORDER — INSULIN ASPART 100 UNIT/ML FLEXPEN: 10.0000 [IU] | PEN_INJECTOR | Freq: Three times a day (TID) | SUBCUTANEOUS | Status: DC

## 2015-09-21 NOTE — Progress Notes (Signed)
Subjective:    Patient ID: Linda Riggs, female    DOB: 02/14/1956, PCP Josue Hector, MD   Past Medical History  Diagnosis Date  . Depression   . Fibromyalgia   . Arthritis   . Type 2 diabetes mellitus (HCC)   . Iron deficiency anemia     Dr. Arlyce Dice 11/10  . GERD (gastroesophageal reflux disease)   . Chronic headaches   . Diverticulosis   . Internal hemorrhoids   . Goiter   . Osteoporosis   . Ruptured lumbar disc    Past Surgical History  Procedure Laterality Date  . Arthroscopic left knee surgery Bilateral   . Dilation and curettage of uterus    . Thyroidectomy N/A 06/06/2013    Procedure: TOTAL THYROIDECTOMY;  Surgeon: Dalia Heading, MD;  Location: [facility];  Service: General;  Laterality: N/A;   Social History   Social History  . Marital Status: Divorced    Spouse Name: N/A  . Number of Children: N/A  . Years of Education: N/A   Social History Main Topics  . Smoking status: Current Every Day Smoker -- 0.50 packs/day for 20 years    Types: Cigarettes  . Smokeless tobacco: Never Used  . Alcohol Use: No  . Drug Use: No  . Sexual Activity: Yes    Birth Control/ Protection: Post-menopausal   Other Topics Concern  . None   Social History Narrative   Outpatient Encounter Prescriptions as of 09/21/2015  Medication Sig  . alendronate (FOSAMAX) 70 MG tablet Take 70 mg by mouth every 7 (seven) days. Take with a full glass of water on an empty stomach.   Marland Kitchen atorvastatin (LIPITOR) 80 MG tablet Take 80 mg by mouth daily.  . Calcium Carbonate-Vit D-Min (CALCIUM 600 + MINERALS) 600-200 MG-UNIT TABS Take 1 tablet by mouth daily.   . canagliflozin (INVOKANA) 100 MG TABS tablet Take 1 tablet (100 mg total) by mouth daily before breakfast.  . escitalopram (LEXAPRO) 20 MG tablet Take 20 mg by mouth daily.    . fenofibrate (TRICOR) 145 MG tablet Take 145 mg by mouth daily.  . fexofenadine (ALLEGRA) 180 MG tablet Take 180 mg by mouth daily.    . fluconazole  (DIFLUCAN) 150 MG tablet Take 1 tablet (150 mg total) by mouth once.  . gabapentin (NEURONTIN) 300 MG capsule Take 300 mg by mouth 3 (three) times daily.  Marland Kitchen glucose blood (ONE TOUCH TEST STRIPS) test strip Use as instructed  . insulin aspart (NOVOLOG FLEXPEN) 100 UNIT/ML FlexPen Inject 10-16 Units into the skin 3 (three) times daily with meals.  . Insulin Detemir (LEVEMIR) 100 UNIT/ML Pen Inject 60 Units into the skin at bedtime.  Marland Kitchen levothyroxine (SYNTHROID, LEVOTHROID) 125 MCG tablet TAKE ONE TABLET EVERY MORNING  . metFORMIN (GLUCOPHAGE-XR) 500 MG 24 hr tablet TAKE ONE TABLET BY MOUTH TWICE DAILY  . potassium gluconate 595 MG TABS Take 595 mg by mouth daily.   . quinapril (ACCUPRIL) 20 MG tablet Take 20 mg by mouth at bedtime.  . traZODone (DESYREL) 100 MG tablet Take 100 mg by mouth at bedtime. Take 3 at bedtime   No facility-administered encounter medications on file as of 09/21/2015.   ALLERGIES: Allergies  Allergen Reactions  . Aspirin Itching  . Flonase [Fluticasone Propionate] Itching  . Prednisone Itching  . Tramadol Itching   VACCINATION STATUS:  There is no immunization history on file for this patient.  Diabetes She presents for her follow-up diabetic visit. She has type  2 diabetes mellitus. Her disease course has been worsening. Pertinent negatives for hypoglycemia include no confusion, headaches, pallor or seizures. Associated symptoms include fatigue. Pertinent negatives for diabetes include no chest pain, no polydipsia, no polyphagia and no polyuria. Symptoms are worsening. There are no diabetic complications. Risk factors for coronary artery disease include diabetes mellitus, dyslipidemia, tobacco exposure, sedentary lifestyle and hypertension. Current diabetic treatment includes insulin injections and oral agent (dual therapy). She is compliant with treatment most of the time. Her weight is stable. She is following a generally unhealthy diet. When asked about meal planning,  she reported none. She rarely participates in exercise. Her breakfast blood glucose range is generally 140-180 mg/dl. Her lunch blood glucose range is generally >200 mg/dl. Her dinner blood glucose range is generally >200 mg/dl. Her overall blood glucose range is >200 mg/dl. An ACE inhibitor/angiotensin II receptor blocker is being taken.  Thyroid Problem Presents for follow-up visit. Symptoms include fatigue. Patient reports no cold intolerance, diarrhea, heat intolerance or palpitations. The symptoms have been stable. Past treatments include levothyroxine. Prior procedures include thyroidectomy. Her past medical history is significant for diabetes and hyperlipidemia.  Hypertension This is a chronic problem. The current episode started more than 1 year ago. Pertinent negatives include no chest pain, headaches, palpitations or shortness of breath. Past treatments include ACE inhibitors. Hypertensive end-organ damage includes a thyroid problem.  Hyperlipidemia This is a chronic problem. The current episode started more than 1 year ago. Exacerbating diseases include diabetes. Pertinent negatives include no chest pain, myalgias or shortness of breath. Current antihyperlipidemic treatment includes statins. Risk factors for coronary artery disease include diabetes mellitus, dyslipidemia and hypertension.     Review of Systems  Constitutional: Positive for fatigue. Negative for unexpected weight change.  HENT: Negative for trouble swallowing and voice change.   Eyes: Negative for visual disturbance.  Respiratory: Negative for cough, shortness of breath and wheezing.   Cardiovascular: Negative for chest pain, palpitations and leg swelling.  Gastrointestinal: Negative for nausea, vomiting and diarrhea.  Endocrine: Negative for cold intolerance, heat intolerance, polydipsia, polyphagia and polyuria.  Musculoskeletal: Negative for myalgias and arthralgias.  Skin: Negative for color change, pallor, rash and  wound.  Neurological: Negative for seizures and headaches.  Psychiatric/Behavioral: Negative for suicidal ideas and confusion.    Objective:    BP 120/86 mmHg  Pulse 91  Ht 5\' 2"  (1.575 m)  Wt 171 lb (77.565 kg)  BMI 31.27 kg/m2  Wt Readings from Last 3 Encounters:  09/21/15 171 lb (77.565 kg)  09/13/15 172 lb (78.019 kg)  06/07/15 172 lb (78.019 kg)    Physical Exam  Constitutional: She is oriented to person, place, and time. She appears well-developed.  HENT:  Head: Normocephalic and atraumatic.  Eyes: EOM are normal.  Neck: Normal range of motion. Neck supple. No tracheal deviation present. No thyromegaly present.  Cardiovascular: Normal rate and regular rhythm.   Pulmonary/Chest: Effort normal and breath sounds normal.  Abdominal: Soft. Bowel sounds are normal. There is no tenderness. There is no guarding.  Musculoskeletal: Normal range of motion. She exhibits no edema.  Neurological: She is alert and oriented to person, place, and time. She has normal reflexes. No cranial nerve deficit. Coordination normal.  Skin: Skin is warm and dry. No rash noted. No erythema. No pallor.  Psychiatric: She has a normal mood and affect. Judgment normal.     Chemistry (most recent): Lab Results  Component Value Date   NA 138 09/06/2015   K 4.6 09/06/2015  CL 103 09/06/2015   CO2 22 09/06/2015   BUN 27* 09/06/2015   CREATININE 1.21* 09/06/2015   Diabetic Labs (most recent): Lab Results  Component Value Date   HGBA1C 9.4* 09/06/2015   HGBA1C 8.2* 05/31/2015   HGBA1C 8.2* 06/06/2013     Assessment & Plan:   1. Uncontrolled type 2 diabetes mellitus with complication, with long-term current use of insulin (HCC) - patient remains at a high risk for more acute and chronic complications of diabetes which include CAD, CVA, CKD, retinopathy, and neuropathy. These are all discussed in detail with the patient.  Patient came with controlled fasting glucose profile, but  recent A1c of   9.4% increasing from 8.2% .   Glucose logs and insulin administration records pertaining to this visit,  to be scanned into patient's records.  Recent labs reviewed and discussed with her.   - I have re-counseled the patient on diet management   by adopting a carbohydrate restricted / protein rich  Diet.  - Suggestion is made for patient to avoid simple carbohydrates   from their diet including Cakes , Desserts, Ice Cream,  Soda (  diet and regular) , Sweet Tea , Candies,  Chips, Cookies, Artificial Sweeteners,   and "Sugar-free" Products .  This will help patient to have stable blood glucose profile and potentially avoid unintended  Weight gain.  - Patient is advised to stick to a routine mealtimes to eat 3 meals  a day and avoid unnecessary snacks ( to snack only to correct hypoglycemia).   - I have approached patient with the following individualized plan to manage diabetes and patient agrees.  - She came with persistently above target blood glucose profile averaging 228 over the last 7 days. She is approached for basal/bolus insulin and she agrees. - I will continue basal insulin Levemir to 60  units QHS, initiate prandial insulin with NovoLog 10 units 3 times a day before meals for pre-meal blood glucose above 90 mg/dL. She is advised to initiate strict monitoring of blood glucose before meals and at bedtime.  associated with strict monitoring of glucose  AC and HS.   -Patient is encouraged to call clinic for blood glucose levels less than 70 or above 300 mg /dl. - I will continue metformin 500 mg by mouth twice a day and Invokana 100 mg by mouth every morning, therapeutically suitable for patient.  - Patient specific target  for A1c; LDL, HDL, Triglycerides, and  Waist Circumference were discussed in detail.  2) BP/HTN: Controlled. Continue current medications including ACEI/ARB. 3) Lipids/HPL:  continue statins.  4)  Postsurgical hypothyroidism -She is on levothyroxine 125 g by  mouth every morning. Her thyroid function tests are consistent with appropriate replacement, she is clinically euthyroid. I advised her to continue on the same dose.  - We discussed about correct intake of levothyroxine, at fasting, with water, separated by at least 30 minutes from breakfast, and separated by more than 4 hours from calcium, iron, multivitamins, acid reflux medications (PPIs). -Patient is made aware of the fact that thyroid hormone replacement is needed for life, dose to be adjusted by periodic monitoring of thyroid function tests.  5)  Weight/Diet:  exercise, and carbohydrates information provided.  6) Chronic Care/Health Maintenance:  -Patient is on ACEI/ARB and Statin medications and encouraged to continue to follow up with Ophthalmology, Podiatrist at least yearly or according to recommendations, and advised to quit smoking. I have recommended yearly flu vaccine and pneumonia vaccination at least every  5 years; moderate intensity exercise for up to 150 minutes weekly; and  sleep for at least 7 hours a day.  - 25 minutes of time was spent on the care of this patient , 50% of which was applied for counseling on diabetes complications and their preventions.  - I advised patient to maintain close follow up with Josue HectorNYLAND,LEONARD ROBERT, MD for primary care needs.  Patient is asked to bring meter and  blood glucose logs during their next visit.   Follow up plan: -Return in about 2 weeks (around 10/05/2015) for follow up with meter and logs- no labs.  Marquis LunchGebre Saveah Bahar, MD Phone: [phone]  Fax: [phone]   09/21/2015, 11:40 AM

## 2015-09-21 NOTE — Patient Instructions (Signed)

## 2015-10-01 ENCOUNTER — Other Ambulatory Visit: Payer: Self-pay | Admitting: "Endocrinology

## 2015-10-04 ENCOUNTER — Other Ambulatory Visit: Payer: Self-pay

## 2015-10-04 MED ORDER — GLUCOSE BLOOD VI STRP: 1.0000 | ORAL_STRIP | Freq: Four times a day (QID) | Status: DC

## 2015-10-08 ENCOUNTER — Encounter: Payer: Self-pay | Admitting: "Endocrinology

## 2015-10-08 ENCOUNTER — Ambulatory Visit (INDEPENDENT_AMBULATORY_CARE_PROVIDER_SITE_OTHER): Payer: Medicare Other | Admitting: "Endocrinology

## 2015-10-08 VITALS — BP 131/75 | HR 80 | Ht 62.0 in | Wt 176.0 lb

## 2015-10-08 DIAGNOSIS — E1165 Type 2 diabetes mellitus with hyperglycemia: Secondary | ICD-10-CM | POA: Diagnosis not present

## 2015-10-08 DIAGNOSIS — E118 Type 2 diabetes mellitus with unspecified complications: Secondary | ICD-10-CM

## 2015-10-08 DIAGNOSIS — E785 Hyperlipidemia, unspecified: Secondary | ICD-10-CM | POA: Diagnosis not present

## 2015-10-08 DIAGNOSIS — E890 Postprocedural hypothyroidism: Secondary | ICD-10-CM | POA: Diagnosis not present

## 2015-10-08 DIAGNOSIS — I10 Essential (primary) hypertension: Secondary | ICD-10-CM | POA: Diagnosis not present

## 2015-10-08 DIAGNOSIS — Z794 Long term (current) use of insulin: Secondary | ICD-10-CM

## 2015-10-08 DIAGNOSIS — IMO0002 Reserved for concepts with insufficient information to code with codable children: Secondary | ICD-10-CM

## 2015-10-08 MED ORDER — INSULIN DETEMIR 100 UNIT/ML FLEXPEN: 60.0000 [IU] | PEN_INJECTOR | Freq: Every day | SUBCUTANEOUS | Status: DC

## 2015-10-08 NOTE — Progress Notes (Signed)
Subjective:    Patient ID: Linda Riggs, female    DOB: 10/31/1955, PCP Josue HectorNYLAND,LEONARD ROBERT, MD   Past Medical History  Diagnosis Date  . Depression   . Fibromyalgia   . Arthritis   . Type 2 diabetes mellitus (HCC)   . Iron deficiency anemia     Dr. Arlyce DiceKaplan 11/10  . GERD (gastroesophageal reflux disease)   . Chronic headaches   . Diverticulosis   . Internal hemorrhoids   . Goiter   . Osteoporosis   . Ruptured lumbar disc    Past Surgical History  Procedure Laterality Date  . Arthroscopic left knee surgery Bilateral   . Dilation and curettage of uterus    . Thyroidectomy N/A 06/06/2013    Procedure: TOTAL THYROIDECTOMY;  Surgeon: Dalia HeadingMark A Jenkins, MD;  Location: [facility];  Service: General;  Laterality: N/A;   Social History   Social History  . Marital Status: Divorced    Spouse Name: N/A  . Number of Children: N/A  . Years of Education: N/A   Social History Main Topics  . Smoking status: Current Every Day Smoker -- 0.50 packs/day for 20 years    Types: Cigarettes  . Smokeless tobacco: Never Used  . Alcohol Use: No  . Drug Use: No  . Sexual Activity: Yes    Birth Control/ Protection: Post-menopausal   Other Topics Concern  . None   Social History Narrative   Outpatient Encounter Prescriptions as of 10/08/2015  Medication Sig  . alendronate (FOSAMAX) 70 MG tablet Take 70 mg by mouth every 7 (seven) days. Take with a full glass of water on an empty stomach.   Marland Kitchen. atorvastatin (LIPITOR) 80 MG tablet Take 80 mg by mouth daily.  . Calcium Carbonate-Vit D-Min (CALCIUM 600 + MINERALS) 600-200 MG-UNIT TABS Take 1 tablet by mouth daily.   . canagliflozin (INVOKANA) 100 MG TABS tablet Take 1 tablet (100 mg total) by mouth daily before breakfast.  . escitalopram (LEXAPRO) 20 MG tablet Take 20 mg by mouth daily.    . fenofibrate (TRICOR) 145 MG tablet Take 145 mg by mouth daily.  . fexofenadine (ALLEGRA) 180 MG tablet Take 180 mg by mouth daily.    . fluconazole  (DIFLUCAN) 150 MG tablet Take 1 tablet (150 mg total) by mouth once.  . gabapentin (NEURONTIN) 300 MG capsule Take 300 mg by mouth 3 (three) times daily.  Marland Kitchen. glucose blood (ONE TOUCH ULTRA TEST) test strip 1 each by Other route 4 (four) times daily. for testing  . insulin aspart (NOVOLOG FLEXPEN) 100 UNIT/ML FlexPen Inject 10-16 Units into the skin 3 (three) times daily with meals.  . Insulin Detemir (LEVEMIR FLEXTOUCH) 100 UNIT/ML Pen Inject 60 Units into the skin daily at 10 pm.  . levothyroxine (SYNTHROID, LEVOTHROID) 125 MCG tablet TAKE ONE TABLET EVERY MORNING  . metFORMIN (GLUCOPHAGE-XR) 500 MG 24 hr tablet TAKE ONE TABLET BY MOUTH TWICE DAILY  . potassium gluconate 595 MG TABS Take 595 mg by mouth daily.   . quinapril (ACCUPRIL) 20 MG tablet Take 20 mg by mouth at bedtime.  . traZODone (DESYREL) 100 MG tablet Take 100 mg by mouth at bedtime. Take 3 at bedtime  . [DISCONTINUED] Insulin Detemir (LEVEMIR) 100 UNIT/ML Pen Inject 60 Units into the skin at bedtime.  . [DISCONTINUED] insulin lispro (HUMALOG KWIKPEN) 100 UNIT/ML KiwkPen Inject 0.1-0.16 mLs (10-16 Units total) into the skin 3 (three) times daily.   No facility-administered encounter medications on file as of 10/08/2015.  ALLERGIES: Allergies  Allergen Reactions  . Aspirin Itching  . Flonase [Fluticasone Propionate] Itching  . Prednisone Itching  . Tramadol Itching   VACCINATION STATUS:  There is no immunization history on file for this patient.  Diabetes She presents for her follow-up diabetic visit. She has type 2 diabetes mellitus. Her disease course has been improving. Pertinent negatives for hypoglycemia include no confusion, headaches, pallor or seizures. Associated symptoms include fatigue. Pertinent negatives for diabetes include no chest pain, no polydipsia, no polyphagia and no polyuria. Symptoms are improving. There are no diabetic complications. Risk factors for coronary artery disease include diabetes mellitus,  dyslipidemia, tobacco exposure, sedentary lifestyle and hypertension. Current diabetic treatment includes insulin injections and oral agent (dual therapy). She is compliant with treatment most of the time. Her weight is stable. She is following a generally unhealthy diet. When asked about meal planning, she reported none. She rarely participates in exercise. Home blood sugar record trend: She did not cover all meals with NovoLog. Her breakfast blood glucose range is generally 140-180 mg/dl. Her lunch blood glucose range is generally 180-200 mg/dl. Her dinner blood glucose range is generally 180-200 mg/dl. Her overall blood glucose range is 180-200 mg/dl. An ACE inhibitor/angiotensin II receptor blocker is being taken.  Thyroid Problem Presents for follow-up visit. Symptoms include fatigue. Patient reports no cold intolerance, diarrhea, heat intolerance or palpitations. The symptoms have been stable. Past treatments include levothyroxine. Prior procedures include thyroidectomy. Her past medical history is significant for diabetes and hyperlipidemia.  Hypertension This is a chronic problem. The current episode started more than 1 year ago. Pertinent negatives include no chest pain, headaches, palpitations or shortness of breath. Past treatments include ACE inhibitors. Hypertensive end-organ damage includes a thyroid problem.  Hyperlipidemia This is a chronic problem. The current episode started more than 1 year ago. Exacerbating diseases include diabetes. Pertinent negatives include no chest pain, myalgias or shortness of breath. Current antihyperlipidemic treatment includes statins. Risk factors for coronary artery disease include diabetes mellitus, dyslipidemia and hypertension.     Review of Systems  Constitutional: Positive for fatigue. Negative for unexpected weight change.  HENT: Negative for trouble swallowing and voice change.   Eyes: Negative for visual disturbance.  Respiratory: Negative for  cough, shortness of breath and wheezing.   Cardiovascular: Negative for chest pain, palpitations and leg swelling.  Gastrointestinal: Negative for nausea, vomiting and diarrhea.  Endocrine: Negative for cold intolerance, heat intolerance, polydipsia, polyphagia and polyuria.  Musculoskeletal: Negative for myalgias and arthralgias.  Skin: Negative for color change, pallor, rash and wound.  Neurological: Negative for seizures and headaches.  Psychiatric/Behavioral: Negative for suicidal ideas and confusion.    Objective:    BP 131/75 mmHg  Pulse 80  Ht 5\' 2"  (1.575 m)  Wt 176 lb (79.833 kg)  BMI 32.18 kg/m2  Wt Readings from Last 3 Encounters:  10/08/15 176 lb (79.833 kg)  09/21/15 171 lb (77.565 kg)  09/13/15 172 lb (78.019 kg)    Physical Exam  Constitutional: She is oriented to person, place, and time. She appears well-developed.  HENT:  Head: Normocephalic and atraumatic.  Eyes: EOM are normal.  Neck: Normal range of motion. Neck supple. No tracheal deviation present. No thyromegaly present.  Cardiovascular: Normal rate and regular rhythm.   Pulmonary/Chest: Effort normal and breath sounds normal.  Abdominal: Soft. Bowel sounds are normal. There is no tenderness. There is no guarding.  Musculoskeletal: Normal range of motion. She exhibits no edema.  Neurological: She is alert and oriented  to person, place, and time. She has normal reflexes. No cranial nerve deficit. Coordination normal.  Skin: Skin is warm and dry. No rash noted. No erythema. No pallor.  Psychiatric: She has a normal mood and affect. Judgment normal.     Chemistry (most recent): Lab Results  Component Value Date   NA 138 09/06/2015   K 4.6 09/06/2015   CL 103 09/06/2015   CO2 22 09/06/2015   BUN 27* 09/06/2015   CREATININE 1.21* 09/06/2015   Diabetic Labs (most recent): Lab Results  Component Value Date   HGBA1C 9.4* 09/06/2015   HGBA1C 8.2* 05/31/2015   HGBA1C 8.2* 06/06/2013     Assessment  & Plan:   1. Uncontrolled type 2 diabetes mellitus with complication, with long-term current use of insulin (HCC) - patient remains at a high risk for more acute and chronic complications of diabetes which include CAD, CVA, CKD, retinopathy, and neuropathy. These are all discussed in detail with the patient.  Patient came with controlled fasting glucose profile, but  recent A1c of  9.4% increasing from 8.2% .   Glucose logs and insulin administration records pertaining to this visit,  to be scanned into patient's records.  Recent labs reviewed and discussed with her.   - I have re-counseled the patient on diet management   by adopting a carbohydrate restricted / protein rich  Diet.  - Suggestion is made for patient to avoid simple carbohydrates   from their diet including Cakes , Desserts, Ice Cream,  Soda (  diet and regular) , Sweet Tea , Candies,  Chips, Cookies, Artificial Sweeteners,   and "Sugar-free" Products .  This will help patient to have stable blood glucose profile and potentially avoid unintended  Weight gain.  - Patient is advised to stick to a routine mealtimes to eat 3 meals  a day and avoid unnecessary snacks ( to snack only to correct hypoglycemia).   - I have approached patient with the following individualized plan to manage diabetes and patient agrees.  - She came with Improved , but still above target blood glucose profile. - This is mainly because she skipped NovoLog when blood glucose readings or between 90 and 150 pre-meal . -I have demonstrated again the proper use of NovoLog and advised her to remain on  basal/bolus insulin and she agrees. - I will continue basal insulin Levemir  60  units QHS, prandial insulin with NovoLog 10 units 3 times a day before meals for pre-meal blood glucose above 90 mg/dL. She is advised to initiate strict monitoring of blood glucose before meals and at bedtime associated with strict monitoring of glucose  AC and HS.   -Patient is  encouraged to call clinic for blood glucose levels less than 70 or above 300 mg /dl. - I will continue metformin 500 mg by mouth twice a day and Invokana 100 mg by mouth every morning, therapeutically suitable for patient.  - Patient specific target  for A1c; LDL, HDL, Triglycerides, and  Waist Circumference were discussed in detail.  2) BP/HTN: Controlled. Continue current medications including ACEI/ARB. 3) Lipids/HPL:  continue statins.  4)  Postsurgical hypothyroidism -She is on levothyroxine 125 g by mouth every morning. Her thyroid function tests are consistent with appropriate replacement, she is clinically euthyroid. I advised her to continue on the same dose.  - We discussed about correct intake of levothyroxine, at fasting, with water, separated by at least 30 minutes from breakfast, and separated by more than 4 hours from  calcium, iron, multivitamins, acid reflux medications (PPIs). -Patient is made aware of the fact that thyroid hormone replacement is needed for life, dose to be adjusted by periodic monitoring of thyroid function tests.  5)  Weight/Diet:  exercise, and carbohydrates information provided.  6) Chronic Care/Health Maintenance:  -Patient is on ACEI/ARB and Statin medications and encouraged to continue to follow up with Ophthalmology, Podiatrist at least yearly or according to recommendations, and advised to quit smoking. I have recommended yearly flu vaccine and pneumonia vaccination at least every 5 years; moderate intensity exercise for up to 150 minutes weekly; and  sleep for at least 7 hours a day.  - 25 minutes of time was spent on the care of this patient , 50% of which was applied for counseling on diabetes complications and their preventions.  - I advised patient to maintain close follow up with Josue Hector, MD for primary care needs.  Patient is asked to bring meter and  blood glucose logs during their next visit.   Follow up plan: -Return in about  4 weeks (around 11/05/2015) for diabetes, high blood pressure, high cholesterol, underactive thyroid, follow up with meter and logs- no labs.  Marquis Lunch, MD Phone: [phone]  Fax: [phone]   10/08/2015, 11:45 AM

## 2015-10-12 ENCOUNTER — Ambulatory Visit: Payer: Medicare Other | Admitting: Nutrition

## 2015-10-26 ENCOUNTER — Other Ambulatory Visit: Payer: Self-pay | Admitting: "Endocrinology

## 2015-11-05 ENCOUNTER — Ambulatory Visit (INDEPENDENT_AMBULATORY_CARE_PROVIDER_SITE_OTHER): Payer: Medicare Other | Admitting: "Endocrinology

## 2015-11-05 ENCOUNTER — Encounter: Payer: Self-pay | Admitting: "Endocrinology

## 2015-11-05 VITALS — BP 142/67 | HR 83 | Ht 62.0 in | Wt 175.0 lb

## 2015-11-05 DIAGNOSIS — E785 Hyperlipidemia, unspecified: Secondary | ICD-10-CM | POA: Diagnosis not present

## 2015-11-05 DIAGNOSIS — E1165 Type 2 diabetes mellitus with hyperglycemia: Secondary | ICD-10-CM | POA: Diagnosis not present

## 2015-11-05 DIAGNOSIS — E890 Postprocedural hypothyroidism: Secondary | ICD-10-CM | POA: Diagnosis not present

## 2015-11-05 DIAGNOSIS — E118 Type 2 diabetes mellitus with unspecified complications: Secondary | ICD-10-CM

## 2015-11-05 DIAGNOSIS — I10 Essential (primary) hypertension: Secondary | ICD-10-CM

## 2015-11-05 DIAGNOSIS — Z794 Long term (current) use of insulin: Secondary | ICD-10-CM | POA: Diagnosis not present

## 2015-11-05 DIAGNOSIS — IMO0002 Reserved for concepts with insufficient information to code with codable children: Secondary | ICD-10-CM

## 2015-11-05 NOTE — Patient Instructions (Signed)

## 2015-11-05 NOTE — Progress Notes (Signed)
Subjective:    Patient ID: Linda Riggs, female    DOB: [birth year], PCP Josue Hector, MD   Past Medical History:  Diagnosis Date  . Arthritis   . Chronic headaches   . Depression   . Diverticulosis   . Fibromyalgia   . GERD (gastroesophageal reflux disease)   . Goiter   . Internal hemorrhoids   . Iron deficiency anemia    Dr. Arlyce Dice 11/10  . Osteoporosis   . Ruptured lumbar disc   . Type 2 diabetes mellitus (HCC)    Past Surgical History:  Procedure Laterality Date  . Arthroscopic left knee surgery Bilateral   . DILATION AND CURETTAGE OF UTERUS    . THYROIDECTOMY N/A 06/06/2013   Procedure: TOTAL THYROIDECTOMY;  Surgeon: Dalia Heading, MD;  Location: [facility];  Service: General;  Laterality: N/A;   Social History   Social History  . Marital status: Divorced    Spouse name: N/A  . Number of children: N/A  . Years of education: N/A   Social History Main Topics  . Smoking status: Current Every Day Smoker    Packs/day: 0.50    Years: 20.00    Types: Cigarettes  . Smokeless tobacco: Never Used  . Alcohol use No  . Drug use: No  . Sexual activity: Yes    Birth control/ protection: Post-menopausal   Other Topics Concern  . None   Social History Narrative  . None   Outpatient Encounter Prescriptions as of 11/05/2015  Medication Sig  . meloxicam (MOBIC) 7.5 MG tablet Take 7.5 mg by mouth daily as needed for pain.  . methocarbamol (ROBAXIN) 500 MG tablet Take 500 mg by mouth daily as needed for muscle spasms.  Marland Kitchen alendronate (FOSAMAX) 70 MG tablet Take 70 mg by mouth every 7 (seven) days. Take with a full glass of water on an empty stomach.   Marland Kitchen atorvastatin (LIPITOR) 80 MG tablet Take 80 mg by mouth daily.  . Calcium Carbonate-Vit D-Min (CALCIUM 600 + MINERALS) 600-200 MG-UNIT TABS Take 1 tablet by mouth daily.   . canagliflozin (INVOKANA) 100 MG TABS tablet Take 1 tablet (100 mg total) by mouth daily before breakfast.  . escitalopram (LEXAPRO) 20 MG  tablet Take 20 mg by mouth daily.    . fenofibrate (TRICOR) 145 MG tablet Take 145 mg by mouth daily.  . fexofenadine (ALLEGRA) 180 MG tablet Take 180 mg by mouth daily.    . fluconazole (DIFLUCAN) 150 MG tablet Take 1 tablet (150 mg total) by mouth once.  . gabapentin (NEURONTIN) 300 MG capsule Take 300 mg by mouth 3 (three) times daily.  Marland Kitchen glucose blood (ONE TOUCH ULTRA TEST) test strip 1 each by Other route 4 (four) times daily. for testing  . insulin aspart (NOVOLOG FLEXPEN) 100 UNIT/ML FlexPen Inject 10-16 Units into the skin 3 (three) times daily with meals.  . Insulin Detemir (LEVEMIR FLEXTOUCH) 100 UNIT/ML Pen Inject 60 Units into the skin daily at 10 pm.  . levothyroxine (SYNTHROID, LEVOTHROID) 125 MCG tablet TAKE ONE TABLET EVERY MORNING  . metFORMIN (GLUCOPHAGE-XR) 500 MG 24 hr tablet TAKE ONE TABLET BY MOUTH TWICE DAILY  . potassium gluconate 595 MG TABS Take 595 mg by mouth daily.   . quinapril (ACCUPRIL) 20 MG tablet Take 20 mg by mouth at bedtime.  . traZODone (DESYREL) 100 MG tablet Take 100 mg by mouth at bedtime. Take 3 at bedtime   No facility-administered encounter medications on file as of 11/05/2015.  ALLERGIES: Allergies  Allergen Reactions  . Aspirin Itching  . Flonase [Fluticasone Propionate] Itching  . Prednisone Itching  . Tramadol Itching   VACCINATION STATUS:  There is no immunization history on file for this patient.  Diabetes  She presents for her follow-up diabetic visit. She has type 2 diabetes mellitus. Her disease course has been improving. Pertinent negatives for hypoglycemia include no confusion, headaches, pallor or seizures. Associated symptoms include fatigue. Pertinent negatives for diabetes include no chest pain, no polydipsia, no polyphagia and no polyuria. Symptoms are improving. There are no diabetic complications. Risk factors for coronary artery disease include diabetes mellitus, dyslipidemia, tobacco exposure, sedentary lifestyle and  hypertension. Current diabetic treatment includes insulin injections and oral agent (dual therapy). She is compliant with treatment most of the time. Her weight is stable. She is following a generally unhealthy diet. When asked about meal planning, she reported none. She rarely participates in exercise. Home blood sugar record trend: She did not cover all meals with NovoLog. Her breakfast blood glucose range is generally 140-180 mg/dl. Her lunch blood glucose range is generally 140-180 mg/dl. Her dinner blood glucose range is generally 140-180 mg/dl. Her overall blood glucose range is 140-180 mg/dl. An ACE inhibitor/angiotensin II receptor blocker is being taken.  Thyroid Problem  Presents for follow-up visit. Symptoms include fatigue. Patient reports no cold intolerance, diarrhea, heat intolerance or palpitations. The symptoms have been stable. Past treatments include levothyroxine. Prior procedures include thyroidectomy. Her past medical history is significant for diabetes and hyperlipidemia.  Hypertension  This is a chronic problem. The current episode started more than 1 year ago. Pertinent negatives include no chest pain, headaches, palpitations or shortness of breath. Past treatments include ACE inhibitors. Hypertensive end-organ damage includes a thyroid problem.  Hyperlipidemia  This is a chronic problem. The current episode started more than 1 year ago. Exacerbating diseases include diabetes. Pertinent negatives include no chest pain, myalgias or shortness of breath. Current antihyperlipidemic treatment includes statins. Risk factors for coronary artery disease include diabetes mellitus, dyslipidemia and hypertension.     Review of Systems  Constitutional: Positive for fatigue. Negative for unexpected weight change.  HENT: Negative for trouble swallowing and voice change.   Eyes: Negative for visual disturbance.  Respiratory: Negative for cough, shortness of breath and wheezing.    Cardiovascular: Negative for chest pain, palpitations and leg swelling.  Gastrointestinal: Negative for diarrhea, nausea and vomiting.  Endocrine: Negative for cold intolerance, heat intolerance, polydipsia, polyphagia and polyuria.  Musculoskeletal: Negative for arthralgias and myalgias.  Skin: Negative for color change, pallor, rash and wound.  Neurological: Negative for seizures and headaches.  Psychiatric/Behavioral: Negative for confusion and suicidal ideas.    Objective:    BP (!) 142/67   Pulse 83   Ht 5\' 2"  (1.575 m)   Wt 175 lb (79.4 kg)   BMI 32.01 kg/m   Wt Readings from Last 3 Encounters:  11/05/15 175 lb (79.4 kg)  10/08/15 176 lb (79.8 kg)  09/21/15 171 lb (77.6 kg)    Physical Exam  Constitutional: She is oriented to person, place, and time. She appears well-developed.  HENT:  Head: Normocephalic and atraumatic.  Eyes: EOM are normal.  Neck: Normal range of motion. Neck supple. No tracheal deviation present. No thyromegaly present.  Cardiovascular: Normal rate and regular rhythm.   Pulmonary/Chest: Effort normal and breath sounds normal.  Abdominal: Soft. Bowel sounds are normal. There is no tenderness. There is no guarding.  Musculoskeletal: Normal range of motion. She exhibits  no edema.  Neurological: She is alert and oriented to person, place, and time. She has normal reflexes. No cranial nerve deficit. Coordination normal.  Skin: Skin is warm and dry. No rash noted. No erythema. No pallor.  Psychiatric: She has a normal mood and affect. Judgment normal.     Chemistry (most recent): Lab Results  Component Value Date   NA 138 09/06/2015   K 4.6 09/06/2015   CL 103 09/06/2015   CO2 22 09/06/2015   BUN 27 (H) 09/06/2015   CREATININE 1.21 (H) 09/06/2015   Diabetic Labs (most recent): Lab Results  Component Value Date   HGBA1C 9.4 (H) 09/06/2015   HGBA1C 8.2 (H) 05/31/2015   HGBA1C 8.2 (H) 06/06/2013     Assessment & Plan:   1. Uncontrolled  type 2 diabetes mellitus with complication, with long-term current use of insulin (HCC) - patient remains at a high risk for more acute and chronic complications of diabetes which include CAD, CVA, CKD, retinopathy, and neuropathy. These are all discussed in detail with the patient.  Patient came with controlled fasting glucose profile, but  recent A1c of  9.4% increasing from 8.2% .   Glucose logs and insulin administration records pertaining to this visit,  to be scanned into patient's records.  Recent labs reviewed and discussed with her.   - I have re-counseled the patient on diet management   by adopting a carbohydrate restricted / protein rich  Diet.  - Suggestion is made for patient to avoid simple carbohydrates   from their diet including Cakes , Desserts, Ice Cream,  Soda (  diet and regular) , Sweet Tea , Candies,  Chips, Cookies, Artificial Sweeteners,   and "Sugar-free" Products .  This will help patient to have stable blood glucose profile and potentially avoid unintended  Weight gain.  - Patient is advised to stick to a routine mealtimes to eat 3 meals  a day and avoid unnecessary snacks ( to snack only to correct hypoglycemia).   - I have approached patient with the following individualized plan to manage diabetes and patient agrees.  - She came with Improved , near target blood glucose profile.  - I will continue basal insulin Levemir  60  units QHS, prandial insulin with NovoLog 10 units 3 times a day before meals for pre-meal blood glucose above 90 mg/dL. She is advised to initiate strict monitoring of blood glucose before meals and at bedtime associated with strict monitoring of glucose  AC and HS.   -Patient is encouraged to call clinic for blood glucose levels less than 70 or above 300 mg /dl. - I will continue metformin 500 mg by mouth twice a day and Invokana 100 mg by mouth every morning, therapeutically suitable for patient.  - Patient specific target  for A1c; LDL,  HDL, Triglycerides, and  Waist Circumference were discussed in detail.  2) BP/HTN: Controlled. Continue current medications including ACEI/ARB. 3) Lipids/HPL:  continue statins.  4)  Postsurgical hypothyroidism -She is on levothyroxine 125 g by mouth every morning. Her thyroid function tests are consistent with appropriate replacement, she is clinically euthyroid. I advised her to continue on the same dose.  - We discussed about correct intake of levothyroxine, at fasting, with water, separated by at least 30 minutes from breakfast, and separated by more than 4 hours from calcium, iron, multivitamins, acid reflux medications (PPIs). -Patient is made aware of the fact that thyroid hormone replacement is needed for life, dose to be adjusted by  periodic monitoring of thyroid function tests.  5)  Weight/Diet:  exercise, and carbohydrates information provided.  6) Chronic Care/Health Maintenance:  -Patient is on ACEI/ARB and Statin medications and encouraged to continue to follow up with Ophthalmology, Podiatrist at least yearly or according to recommendations, and advised to quit smoking. I have recommended yearly flu vaccine and pneumonia vaccination at least every 5 years; moderate intensity exercise for up to 150 minutes weekly; and  sleep for at least 7 hours a day.  - 25 minutes of time was spent on the care of this patient , 50% of which was applied for counseling on diabetes complications and their preventions.  - I advised patient to maintain close follow up with Josue Hector, MD for primary care needs.  Patient is asked to bring meter and  blood glucose logs during their next visit.   Follow up plan: -Return in about 6 weeks (around 12/17/2015) for follow up with pre-visit labs, meter, and logs.  Marquis Lunch, MD Phone: [phone]  Fax: [phone]   11/05/2015, 11:50 AM

## 2015-12-11 ENCOUNTER — Other Ambulatory Visit: Payer: Self-pay | Admitting: "Endocrinology

## 2015-12-11 LAB — COMPLETE METABOLIC PANEL WITH GFR
ALBUMIN: 3.8 g/dL (ref 3.6–5.1)
ALK PHOS: 46 U/L (ref 33–130)
ALT: 19 U/L (ref 6–29)
AST: 18 U/L (ref 10–35)
BUN: 20 mg/dL (ref 7–25)
CHLORIDE: 107 mmol/L (ref 98–110)
CO2: 21 mmol/L (ref 20–31)
Calcium: 9 mg/dL (ref 8.6–10.4)
Creat: 0.99 mg/dL (ref 0.50–0.99)
GFR, EST NON AFRICAN AMERICAN: 62 mL/min (ref >=60)
GFR, Est African American: 72 mL/min (ref >=60)
GLUCOSE: 82 mg/dL (ref 65–99)
POTASSIUM: 4.5 mmol/L (ref 3.5–5.3)
SODIUM: 140 mmol/L (ref 135–146)
Total Bilirubin: 0.2 mg/dL (ref 0.2–1.2)
Total Protein: 6.5 g/dL (ref 6.1–8.1)

## 2015-12-11 LAB — T4, FREE: Free T4: 1.2 ng/dL (ref 0.8–1.8)

## 2015-12-11 LAB — TSH: TSH: 0.66 m[IU]/L

## 2015-12-12 LAB — HEMOGLOBIN A1C
Hgb A1c MFr Bld: 7 % — ABNORMAL HIGH (ref <5.7)
Mean Plasma Glucose: 154 mg/dL

## 2015-12-12 LAB — MICROALBUMIN / CREATININE URINE RATIO
Creatinine, Urine: 90 mg/dL (ref 20–320)
MICROALB/CREAT RATIO: 14 ug/mg{creat} (ref <30)
Microalb, Ur: 1.3 mg/dL

## 2015-12-18 ENCOUNTER — Ambulatory Visit (INDEPENDENT_AMBULATORY_CARE_PROVIDER_SITE_OTHER): Payer: Medicare Other | Admitting: "Endocrinology

## 2015-12-18 ENCOUNTER — Encounter: Payer: Self-pay | Admitting: "Endocrinology

## 2015-12-18 VITALS — BP 138/72 | HR 73 | Ht 62.0 in | Wt 182.0 lb

## 2015-12-18 DIAGNOSIS — E785 Hyperlipidemia, unspecified: Secondary | ICD-10-CM | POA: Diagnosis not present

## 2015-12-18 DIAGNOSIS — Z794 Long term (current) use of insulin: Secondary | ICD-10-CM

## 2015-12-18 DIAGNOSIS — I10 Essential (primary) hypertension: Secondary | ICD-10-CM | POA: Diagnosis not present

## 2015-12-18 DIAGNOSIS — E890 Postprocedural hypothyroidism: Secondary | ICD-10-CM | POA: Diagnosis not present

## 2015-12-18 DIAGNOSIS — E1165 Type 2 diabetes mellitus with hyperglycemia: Secondary | ICD-10-CM

## 2015-12-18 DIAGNOSIS — E118 Type 2 diabetes mellitus with unspecified complications: Secondary | ICD-10-CM

## 2015-12-18 DIAGNOSIS — IMO0002 Reserved for concepts with insufficient information to code with codable children: Secondary | ICD-10-CM

## 2015-12-18 NOTE — Progress Notes (Signed)
Subjective:    Patient ID: Linda Riggs, female    DOB: September 14, 1955, PCP Josue Hector, MD   Past Medical History:  Diagnosis Date  . Arthritis   . Chronic headaches   . Depression   . Diverticulosis   . Fibromyalgia   . GERD (gastroesophageal reflux disease)   . Goiter   . Internal hemorrhoids   . Iron deficiency anemia    Dr. Arlyce Dice 11/10  . Osteoporosis   . Ruptured lumbar disc   . Type 2 diabetes mellitus (HCC)    Past Surgical History:  Procedure Laterality Date  . Arthroscopic left knee surgery Bilateral   . DILATION AND CURETTAGE OF UTERUS    . THYROIDECTOMY N/A 06/06/2013   Procedure: TOTAL THYROIDECTOMY;  Surgeon: Dalia Heading, MD;  Location: [facility];  Service: General;  Laterality: N/A;   Social History   Social History  . Marital status: Divorced    Spouse name: N/A  . Number of children: N/A  . Years of education: N/A   Social History Main Topics  . Smoking status: Current Every Day Smoker    Packs/day: 0.50    Years: 20.00    Types: Cigarettes  . Smokeless tobacco: Never Used  . Alcohol use No  . Drug use: No  . Sexual activity: Yes    Birth control/ protection: Post-menopausal   Other Topics Concern  . None   Social History Narrative  . None   Outpatient Encounter Prescriptions as of 12/18/2015  Medication Sig  . alendronate (FOSAMAX) 70 MG tablet Take 70 mg by mouth every 7 (seven) days. Take with a full glass of water on an empty stomach.   Marland Kitchen atorvastatin (LIPITOR) 80 MG tablet Take 80 mg by mouth daily.  . Calcium Carbonate-Vit D-Min (CALCIUM 600 + MINERALS) 600-200 MG-UNIT TABS Take 1 tablet by mouth daily.   . canagliflozin (INVOKANA) 100 MG TABS tablet Take 1 tablet (100 mg total) by mouth daily before breakfast.  . escitalopram (LEXAPRO) 20 MG tablet Take 20 mg by mouth daily.    . fenofibrate (TRICOR) 145 MG tablet Take 145 mg by mouth daily.  . fexofenadine (ALLEGRA) 180 MG tablet Take 180 mg by mouth daily.    .  fluconazole (DIFLUCAN) 150 MG tablet Take 1 tablet (150 mg total) by mouth once.  . gabapentin (NEURONTIN) 300 MG capsule Take 300 mg by mouth 3 (three) times daily.  Marland Kitchen glucose blood (ONE TOUCH ULTRA TEST) test strip 1 each by Other route 4 (four) times daily. for testing  . insulin aspart (NOVOLOG FLEXPEN) 100 UNIT/ML FlexPen Inject 10-16 Units into the skin 3 (three) times daily with meals.  . Insulin Detemir (LEVEMIR FLEXTOUCH) 100 UNIT/ML Pen Inject 60 Units into the skin daily at 10 pm.  . levothyroxine (SYNTHROID, LEVOTHROID) 125 MCG tablet TAKE ONE TABLET EVERY MORNING  . meloxicam (MOBIC) 7.5 MG tablet Take 7.5 mg by mouth daily as needed for pain.  . metFORMIN (GLUCOPHAGE-XR) 500 MG 24 hr tablet TAKE ONE TABLET BY MOUTH TWICE DAILY  . methocarbamol (ROBAXIN) 500 MG tablet Take 500 mg by mouth daily as needed for muscle spasms.  . potassium gluconate 595 MG TABS Take 595 mg by mouth daily.   . quinapril (ACCUPRIL) 20 MG tablet Take 20 mg by mouth at bedtime.  . traZODone (DESYREL) 100 MG tablet Take 100 mg by mouth at bedtime. Take 3 at bedtime   No facility-administered encounter medications on file as of 12/18/2015.  ALLERGIES: Allergies  Allergen Reactions  . Aspirin Itching  . Flonase [Fluticasone Propionate] Itching  . Prednisone Itching  . Tramadol Itching   VACCINATION STATUS:  There is no immunization history on file for this patient.  Diabetes  She presents for her follow-up diabetic visit. She has type 2 diabetes mellitus. Her disease course has been improving. Pertinent negatives for hypoglycemia include no confusion, headaches, pallor or seizures. Associated symptoms include fatigue. Pertinent negatives for diabetes include no chest pain, no polydipsia, no polyphagia and no polyuria. Symptoms are improving. There are no diabetic complications. Risk factors for coronary artery disease include diabetes mellitus, dyslipidemia, tobacco exposure, sedentary lifestyle and  hypertension. Current diabetic treatment includes insulin injections and oral agent (dual therapy). She is compliant with treatment most of the time. Her weight is increasing steadily. She is following a generally unhealthy diet. When asked about meal planning, she reported none. She rarely participates in exercise. Home blood sugar record trend: She did not bring her logs. Her overall blood glucose range is 140-180 mg/dl. An ACE inhibitor/angiotensin II receptor blocker is being taken.  Thyroid Problem  Presents for follow-up visit. Symptoms include fatigue. Patient reports no cold intolerance, diarrhea, heat intolerance or palpitations. The symptoms have been stable. Past treatments include levothyroxine. Prior procedures include thyroidectomy. Her past medical history is significant for diabetes and hyperlipidemia.  Hypertension  This is a chronic problem. The current episode started more than 1 year ago. Pertinent negatives include no chest pain, headaches, palpitations or shortness of breath. Past treatments include ACE inhibitors. Hypertensive end-organ damage includes a thyroid problem.  Hyperlipidemia  This is a chronic problem. The current episode started more than 1 year ago. Exacerbating diseases include diabetes. Pertinent negatives include no chest pain, myalgias or shortness of breath. Current antihyperlipidemic treatment includes statins. Risk factors for coronary artery disease include diabetes mellitus, dyslipidemia and hypertension.     Review of Systems  Constitutional: Positive for fatigue. Negative for unexpected weight change.  HENT: Negative for trouble swallowing and voice change.   Eyes: Negative for visual disturbance.  Respiratory: Negative for cough, shortness of breath and wheezing.   Cardiovascular: Negative for chest pain, palpitations and leg swelling.  Gastrointestinal: Negative for diarrhea, nausea and vomiting.  Endocrine: Negative for cold intolerance, heat  intolerance, polydipsia, polyphagia and polyuria.  Musculoskeletal: Negative for arthralgias and myalgias.  Skin: Negative for color change, pallor, rash and wound.  Neurological: Negative for seizures and headaches.  Psychiatric/Behavioral: Negative for confusion and suicidal ideas.    Objective:    BP 138/72   Pulse 73   Ht 5\' 2"  (1.575 m)   Wt 182 lb (82.6 kg)   BMI 33.29 kg/m   Wt Readings from Last 3 Encounters:  12/18/15 182 lb (82.6 kg)  11/05/15 175 lb (79.4 kg)  10/08/15 176 lb (79.8 kg)    Physical Exam  Constitutional: She is oriented to person, place, and time. She appears well-developed.  HENT:  Head: Normocephalic and atraumatic.  Eyes: EOM are normal.  Neck: Normal range of motion. Neck supple. No tracheal deviation present. No thyromegaly present.  Cardiovascular: Normal rate and regular rhythm.   Pulmonary/Chest: Effort normal and breath sounds normal.  Abdominal: Soft. Bowel sounds are normal. There is no tenderness. There is no guarding.  Musculoskeletal: Normal range of motion. She exhibits no edema.  Neurological: She is alert and oriented to person, place, and time. She has normal reflexes. No cranial nerve deficit. Coordination normal.  Skin: Skin is warm  and dry. No rash noted. No erythema. No pallor.  Psychiatric: She has a normal mood and affect. Judgment normal.     Chemistry (most recent): Lab Results  Component Value Date   NA 140 12/11/2015   K 4.5 12/11/2015   CL 107 12/11/2015   CO2 21 12/11/2015   BUN 20 12/11/2015   CREATININE 0.99 12/11/2015   Diabetic Labs (most recent): Lab Results  Component Value Date   HGBA1C 7.0 (H) 12/11/2015   HGBA1C 9.4 (H) 09/06/2015   HGBA1C 8.2 (H) 05/31/2015     Assessment & Plan:   1. Uncontrolled type 2 diabetes mellitus with complication, with long-term current use of insulin (HCC) - patient remains at a high risk for more acute and chronic complications of diabetes which include CAD, CVA,  CKD, retinopathy, and neuropathy. These are all discussed in detail with the patient.  Patient came with controlled  glucose profile,  EAG 179, A1c improving to 7% from 9.4%.   Recent labs reviewed and discussed with her.   - I have re-counseled the patient on diet management   by adopting a carbohydrate restricted / protein rich  Diet.  - Suggestion is made for patient to avoid simple carbohydrates   from their diet including Cakes , Desserts, Ice Cream,  Soda (  diet and regular) , Sweet Tea , Candies,  Chips, Cookies, Artificial Sweeteners,   and "Sugar-free" Products .  This will help patient to have stable blood glucose profile and potentially avoid unintended  Weight gain.  - Patient is advised to stick to a routine mealtimes to eat 3 meals  a day and avoid unnecessary snacks ( to snack only to correct hypoglycemia).   - I have approached patient with the following individualized plan to manage diabetes and patient agrees.  - She came with Improved , near target blood glucose profile.  - I will continue basal insulin Levemir  60  units QHS, prandial insulin with NovoLog 10 units 3 times a day before meals for pre-meal blood glucose above 90 mg/dL. She is advised to initiate strict monitoring of blood glucose before meals and at bedtime associated with strict monitoring of glucose  AC and HS.   -Patient is encouraged to call clinic for blood glucose levels less than 70 or above 300 mg /dl. - I will continue metformin 500 mg by mouth twice a day and Invokana 100 mg by mouth every morning, therapeutically suitable for patient.  - Patient specific target  for A1c; LDL, HDL, Triglycerides, and  Waist Circumference were discussed in detail.  2) BP/HTN: Controlled. Continue current medications including ACEI/ARB. 3) Lipids/HPL:  continue statins.  4)  Postsurgical hypothyroidism -She is on levothyroxine 125 g by mouth every morning. Her thyroid function tests are consistent with  appropriate replacement, she is clinically euthyroid. I advised her to continue on the same dose.  - We discussed about correct intake of levothyroxine, at fasting, with water, separated by at least 30 minutes from breakfast, and separated by more than 4 hours from calcium, iron, multivitamins, acid reflux medications (PPIs). -Patient is made aware of the fact that thyroid hormone replacement is needed for life, dose to be adjusted by periodic monitoring of thyroid function tests.  5)  Weight/Diet:  Gaining weight, declines to see the dietitian, exercise, and carbohydrates information provided.  6) Chronic Care/Health Maintenance:  -Patient is on ACEI/ARB and Statin medications and encouraged to continue to follow up with Ophthalmology, Podiatrist at least yearly or according  to recommendations, and advised to quit smoking. I have recommended yearly flu vaccine and pneumonia vaccination at least every 5 years; moderate intensity exercise for up to 150 minutes weekly; and  sleep for at least 7 hours a day.  - 25 minutes of time was spent on the care of this patient , 50% of which was applied for counseling on diabetes complications and their preventions.  - I advised patient to maintain close follow up with Josue Hector, MD for primary care needs.  Patient is asked to bring meter and  blood glucose logs during their next visit.   Follow up plan: -Return in about 3 months (around 03/18/2016) for follow up with pre-visit labs, meter, and logs.  Marquis Lunch, MD Phone: [phone]  Fax: [phone]   12/18/2015, 11:16 AM

## 2015-12-18 NOTE — Patient Instructions (Signed)

## 2015-12-27 ENCOUNTER — Other Ambulatory Visit: Payer: Self-pay | Admitting: "Endocrinology

## 2016-01-25 ENCOUNTER — Other Ambulatory Visit: Payer: Self-pay | Admitting: "Endocrinology

## 2016-01-25 ENCOUNTER — Emergency Department (HOSPITAL_COMMUNITY): Payer: Medicare Other

## 2016-01-25 ENCOUNTER — Encounter (HOSPITAL_COMMUNITY): Payer: Self-pay | Admitting: Emergency Medicine

## 2016-01-25 ENCOUNTER — Emergency Department (HOSPITAL_COMMUNITY)
Admission: EM | Admit: 2016-01-25 | Discharge: 2016-01-25 | Disposition: A | Discharge status: Home / Self Care | Payer: Medicare Other | Attending: Emergency Medicine | Admitting: Emergency Medicine

## 2016-01-25 DIAGNOSIS — Z794 Long term (current) use of insulin: Secondary | ICD-10-CM | POA: Diagnosis not present

## 2016-01-25 DIAGNOSIS — Z7984 Long term (current) use of oral hypoglycemic drugs: Secondary | ICD-10-CM | POA: Insufficient documentation

## 2016-01-25 DIAGNOSIS — Y92410 Unspecified street and highway as the place of occurrence of the external cause: Secondary | ICD-10-CM | POA: Insufficient documentation

## 2016-01-25 DIAGNOSIS — S161XXA Strain of muscle, fascia and tendon at neck level, initial encounter: Secondary | ICD-10-CM

## 2016-01-25 DIAGNOSIS — M79601 Pain in right arm: Secondary | ICD-10-CM

## 2016-01-25 DIAGNOSIS — Z79899 Other long term (current) drug therapy: Secondary | ICD-10-CM | POA: Insufficient documentation

## 2016-01-25 DIAGNOSIS — Y9389 Activity, other specified: Secondary | ICD-10-CM | POA: Diagnosis not present

## 2016-01-25 DIAGNOSIS — Y999 Unspecified external cause status: Secondary | ICD-10-CM | POA: Insufficient documentation

## 2016-01-25 DIAGNOSIS — E119 Type 2 diabetes mellitus without complications: Secondary | ICD-10-CM | POA: Diagnosis not present

## 2016-01-25 DIAGNOSIS — S0990XA Unspecified injury of head, initial encounter: Secondary | ICD-10-CM

## 2016-01-25 DIAGNOSIS — Z87891 Personal history of nicotine dependence: Secondary | ICD-10-CM | POA: Diagnosis not present

## 2016-01-25 LAB — CBG MONITORING, ED: GLUCOSE-CAPILLARY: 147 mg/dL — AB (ref 65–99)

## 2016-01-25 MED ORDER — PROMETHAZINE HCL 25 MG PO TABS: 25.0000 mg | ORAL_TABLET | Freq: Four times a day (QID) | ORAL | 1 refills | Status: DC | PRN

## 2016-01-25 MED ORDER — HYDROCODONE-ACETAMINOPHEN 5-325 MG PO TABS: 1.0000 | ORAL_TABLET | Freq: Four times a day (QID) | ORAL | 0 refills | Status: DC | PRN
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
## 2016-02-23 ENCOUNTER — Other Ambulatory Visit: Payer: Self-pay | Admitting: "Endocrinology

## 2016-02-27 ENCOUNTER — Other Ambulatory Visit (HOSPITAL_COMMUNITY): Payer: Self-pay | Admitting: Family Medicine

## 2016-02-27 DIAGNOSIS — M549 Dorsalgia, unspecified: Principal | ICD-10-CM

## 2016-02-27 DIAGNOSIS — M5430 Sciatica, unspecified side: Secondary | ICD-10-CM

## 2016-02-29 ENCOUNTER — Ambulatory Visit (HOSPITAL_COMMUNITY)
Admission: RE | Admit: 2016-02-29 | Discharge: 2016-02-29 | Disposition: A | Discharge status: Home / Self Care | Payer: Medicare Other | Source: Ambulatory Visit | Attending: Family Medicine | Admitting: Family Medicine

## 2016-02-29 DIAGNOSIS — M5136 Other intervertebral disc degeneration, lumbar region: Secondary | ICD-10-CM | POA: Diagnosis not present

## 2016-02-29 DIAGNOSIS — M5430 Sciatica, unspecified side: Secondary | ICD-10-CM | POA: Insufficient documentation

## 2016-02-29 DIAGNOSIS — M48061 Spinal stenosis, lumbar region without neurogenic claudication: Secondary | ICD-10-CM | POA: Insufficient documentation

## 2016-02-29 DIAGNOSIS — M549 Dorsalgia, unspecified: Secondary | ICD-10-CM

## 2016-02-29 DIAGNOSIS — M5127 Other intervertebral disc displacement, lumbosacral region: Secondary | ICD-10-CM | POA: Insufficient documentation

## 2016-02-29 DIAGNOSIS — M4807 Spinal stenosis, lumbosacral region: Secondary | ICD-10-CM | POA: Diagnosis not present

## 2016-03-13 ENCOUNTER — Other Ambulatory Visit: Payer: Self-pay | Admitting: "Endocrinology

## 2016-03-13 LAB — LIPID PANEL
Cholesterol: 192 mg/dL (ref <200)
HDL: 56 mg/dL (ref >50)
LDL CALC: 103 mg/dL — AB (ref <100)
TRIGLYCERIDES: 164 mg/dL — AB (ref <150)
Total CHOL/HDL Ratio: 3.4 Ratio (ref <5.0)
VLDL: 33 mg/dL — ABNORMAL HIGH (ref <30)

## 2016-03-13 LAB — COMPREHENSIVE METABOLIC PANEL
ALBUMIN: 4.1 g/dL (ref 3.6–5.1)
ALK PHOS: 37 U/L (ref 33–130)
ALT: 14 U/L (ref 6–29)
AST: 15 U/L (ref 10–35)
BILIRUBIN TOTAL: 0.3 mg/dL (ref 0.2–1.2)
BUN: 25 mg/dL (ref 7–25)
CALCIUM: 9.7 mg/dL (ref 8.6–10.4)
CO2: 23 mmol/L (ref 20–31)
Chloride: 105 mmol/L (ref 98–110)
Creat: 1.38 mg/dL — ABNORMAL HIGH (ref 0.50–0.99)
Glucose, Bld: 144 mg/dL — ABNORMAL HIGH (ref 65–99)
Potassium: 4.6 mmol/L (ref 3.5–5.3)
Sodium: 139 mmol/L (ref 135–146)
Total Protein: 7.1 g/dL (ref 6.1–8.1)

## 2016-03-13 LAB — TSH: TSH: 0.8 mIU/L

## 2016-03-13 LAB — HEMOGLOBIN A1C
Hgb A1c MFr Bld: 7 % — ABNORMAL HIGH (ref <5.7)
MEAN PLASMA GLUCOSE: 154 mg/dL

## 2016-03-13 LAB — T4, FREE: Free T4: 1.3 ng/dL (ref 0.8–1.8)

## 2016-03-14 LAB — MICROALBUMIN / CREATININE URINE RATIO
CREATININE, URINE: 94 mg/dL (ref 20–320)
Microalb Creat Ratio: 21 mcg/mg creat (ref <30)
Microalb, Ur: 2 mg/dL

## 2016-03-20 ENCOUNTER — Ambulatory Visit (INDEPENDENT_AMBULATORY_CARE_PROVIDER_SITE_OTHER): Payer: Medicare Other | Admitting: "Endocrinology

## 2016-03-20 ENCOUNTER — Encounter: Payer: Self-pay | Admitting: "Endocrinology

## 2016-03-20 VITALS — BP 121/73 | HR 78 | Ht 62.0 in | Wt 183.0 lb

## 2016-03-20 DIAGNOSIS — I10 Essential (primary) hypertension: Secondary | ICD-10-CM | POA: Diagnosis not present

## 2016-03-20 DIAGNOSIS — E782 Mixed hyperlipidemia: Secondary | ICD-10-CM | POA: Diagnosis not present

## 2016-03-20 DIAGNOSIS — E118 Type 2 diabetes mellitus with unspecified complications: Secondary | ICD-10-CM

## 2016-03-20 DIAGNOSIS — Z794 Long term (current) use of insulin: Secondary | ICD-10-CM | POA: Diagnosis not present

## 2016-03-20 DIAGNOSIS — IMO0002 Reserved for concepts with insufficient information to code with codable children: Secondary | ICD-10-CM

## 2016-03-20 DIAGNOSIS — E890 Postprocedural hypothyroidism: Secondary | ICD-10-CM

## 2016-03-20 DIAGNOSIS — E1165 Type 2 diabetes mellitus with hyperglycemia: Secondary | ICD-10-CM | POA: Diagnosis not present

## 2016-03-20 NOTE — Patient Instructions (Signed)

## 2016-03-20 NOTE — Progress Notes (Signed)
Subjective:    Patient ID: Linda Riggs, female    DOB: 03/09/1956, PCP Josue HectorNYLAND,LEONARD ROBERT, MD   Past Medical History:  Diagnosis Date  . Arthritis   . Chronic headaches   . Depression   . Diverticulosis   . Fibromyalgia   . GERD (gastroesophageal reflux disease)   . Goiter   . Internal hemorrhoids   . Iron deficiency anemia    Dr. Arlyce DiceKaplan 11/10  . Osteoporosis   . Ruptured lumbar disc   . Type 2 diabetes mellitus (HCC)    Past Surgical History:  Procedure Laterality Date  . Arthroscopic left knee surgery Bilateral   . DILATION AND CURETTAGE OF UTERUS    . THYROIDECTOMY N/A 06/06/2013   Procedure: TOTAL THYROIDECTOMY;  Surgeon: Dalia HeadingMark A Jenkins, MD;  Location: [facility];  Service: General;  Laterality: N/A;   Social History   Social History  . Marital status: Divorced    Spouse name: N/A  . Number of children: N/A  . Years of education: N/A   Social History Main Topics  . Smoking status: Former Smoker    Packs/day: 0.50    Years: 20.00    Types: Cigarettes    Quit date: 04/27/2015  . Smokeless tobacco: Never Used  . Alcohol use No  . Drug use: No  . Sexual activity: Yes    Birth control/ protection: Post-menopausal   Other Topics Concern  . None   Social History Narrative  . None   Outpatient Encounter Prescriptions as of 03/20/2016  Medication Sig  . alendronate (FOSAMAX) 70 MG tablet Take 70 mg by mouth every Sunday. Take with a full glass of water on an empty stomach.   Marland Kitchen. atorvastatin (LIPITOR) 80 MG tablet Take 80 mg by mouth daily.  . Calcium Carbonate-Vit D-Min (CALCIUM 600 + MINERALS) 600-200 MG-UNIT TABS Take 1 tablet by mouth 2 (two) times daily.   Marland Kitchen. escitalopram (LEXAPRO) 20 MG tablet Take 20 mg by mouth daily.    Marland Kitchen. esomeprazole (NEXIUM) 40 MG capsule Take 40 mg by mouth daily.   . fenofibrate 160 MG tablet Take 160 mg by mouth daily.   . fexofenadine (ALLEGRA) 180 MG tablet Take 180 mg by mouth daily.    Marland Kitchen. gabapentin (NEURONTIN) 300 MG  capsule Take 300 mg by mouth 3 (three) times daily.  Marland Kitchen. glucose blood (ONE TOUCH ULTRA TEST) test strip 1 each by Other route 4 (four) times daily. for testing  . HUMALOG KWIKPEN 100 UNIT/ML KiwkPen INJECT 10-16 UNITS SQ 3 TIMES DAILY WITHMEALS  . HYDROcodone-acetaminophen (NORCO/VICODIN) 5-325 MG tablet Take 1-2 tablets by mouth every 6 (six) hours as needed.  . Insulin Detemir (LEVEMIR FLEXTOUCH) 100 UNIT/ML Pen Inject 60 Units into the skin daily at 10 pm.  . INVOKANA 100 MG TABS tablet TAKE ONE (1) TABLET EACH DAY  . levothyroxine (SYNTHROID, LEVOTHROID) 125 MCG tablet TAKE ONE TABLET EVERY MORNING  . meloxicam (MOBIC) 7.5 MG tablet Take 7.5 mg by mouth daily as needed for pain.  . metFORMIN (GLUCOPHAGE-XR) 500 MG 24 hr tablet TAKE ONE TABLET BY MOUTH TWICE DAILY  . methocarbamol (ROBAXIN) 500 MG tablet Take 500 mg by mouth every 8 (eight) hours as needed for muscle spasms.   . potassium gluconate 595 MG TABS Take 595 mg by mouth daily.   . promethazine (PHENERGAN) 25 MG tablet Take 1 tablet (25 mg total) by mouth every 6 (six) hours as needed.  . quinapril (ACCUPRIL) 10 MG tablet Take 10 mg  by mouth daily.   . traZODone (DESYREL) 100 MG tablet Take 100-300 mg by mouth at bedtime.    No facility-administered encounter medications on file as of 03/20/2016.    ALLERGIES: Allergies  Allergen Reactions  . Aspirin Itching  . Flonase [Fluticasone Propionate] Itching  . Prednisone Itching  . Tramadol Itching   VACCINATION STATUS:  There is no immunization history on file for this patient.  Diabetes  She presents for her follow-up diabetic visit. She has type 2 diabetes mellitus. Her disease course has been improving. Pertinent negatives for hypoglycemia include no confusion, headaches, pallor or seizures. Associated symptoms include fatigue. Pertinent negatives for diabetes include no chest pain, no polydipsia, no polyphagia and no polyuria. Symptoms are improving. There are no diabetic  complications. Risk factors for coronary artery disease include diabetes mellitus, dyslipidemia, tobacco exposure, sedentary lifestyle and hypertension. Current diabetic treatment includes insulin injections and oral agent (dual therapy). She is compliant with treatment most of the time. Her weight is increasing steadily. She is following a generally unhealthy diet. When asked about meal planning, she reported none. She rarely participates in exercise. Home blood sugar record trend: She did not bring her logs. Her overall blood glucose range is 140-180 mg/dl. An ACE inhibitor/angiotensin II receptor blocker is being taken.  Thyroid Problem  Presents for follow-up visit. Symptoms include fatigue. Patient reports no cold intolerance, diarrhea, heat intolerance or palpitations. The symptoms have been stable. Past treatments include levothyroxine. Prior procedures include thyroidectomy. Her past medical history is significant for diabetes and hyperlipidemia.  Hypertension  This is a chronic problem. The current episode started more than 1 year ago. Pertinent negatives include no chest pain, headaches, palpitations or shortness of breath. Past treatments include ACE inhibitors. Hypertensive end-organ damage includes a thyroid problem.  Hyperlipidemia  This is a chronic problem. The current episode started more than 1 year ago. Exacerbating diseases include diabetes. Pertinent negatives include no chest pain, myalgias or shortness of breath. Current antihyperlipidemic treatment includes statins. Risk factors for coronary artery disease include diabetes mellitus, dyslipidemia and hypertension.     Review of Systems  Constitutional: Positive for fatigue. Negative for unexpected weight change.  HENT: Negative for trouble swallowing and voice change.   Eyes: Negative for visual disturbance.  Respiratory: Negative for cough, shortness of breath and wheezing.   Cardiovascular: Negative for chest pain,  palpitations and leg swelling.  Gastrointestinal: Negative for diarrhea, nausea and vomiting.  Endocrine: Negative for cold intolerance, heat intolerance, polydipsia, polyphagia and polyuria.  Musculoskeletal: Negative for arthralgias and myalgias.  Skin: Negative for color change, pallor, rash and wound.  Neurological: Negative for seizures and headaches.  Psychiatric/Behavioral: Negative for confusion and suicidal ideas.    Objective:    BP 121/73   Pulse 78   Ht 5\' 2"  (1.575 m)   Wt 183 lb (83 kg)   BMI 33.47 kg/m   Wt Readings from Last 3 Encounters:  03/20/16 183 lb (83 kg)  01/25/16 180 lb (81.6 kg)  12/18/15 182 lb (82.6 kg)    Physical Exam  Constitutional: She is oriented to person, place, and time. She appears well-developed.  HENT:  Head: Normocephalic and atraumatic.  Eyes: EOM are normal.  Neck: Normal range of motion. Neck supple. No tracheal deviation present. No thyromegaly present.  Cardiovascular: Normal rate and regular rhythm.   Pulmonary/Chest: Effort normal and breath sounds normal.  Abdominal: Soft. Bowel sounds are normal. There is no tenderness. There is no guarding.  Musculoskeletal: Normal range  of motion. She exhibits no edema.  Neurological: She is alert and oriented to person, place, and time. She has normal reflexes. No cranial nerve deficit. Coordination normal.  Skin: Skin is warm and dry. No rash noted. No erythema. No pallor.  Psychiatric: She has a normal mood and affect. Judgment normal.     Chemistry (most recent): Lab Results  Component Value Date   NA 139 03/13/2016   K 4.6 03/13/2016   CL 105 03/13/2016   CO2 23 03/13/2016   BUN 25 03/13/2016   CREATININE 1.38 (H) 03/13/2016   Diabetic Labs (most recent): Lab Results  Component Value Date   HGBA1C 7.0 (H) 03/13/2016   HGBA1C 7.0 (H) 12/11/2015   HGBA1C 9.4 (H) 09/06/2015     Assessment & Plan:   1. Uncontrolled type 2 diabetes mellitus with complication, with  long-term current use of insulin (HCC) - patient remains at a high risk for more acute and chronic complications of diabetes which include CAD, CVA, CKD, retinopathy, and neuropathy. These are all discussed in detail with the patient.  Patient came with controlled  glucose profile,  A1c improving to 7% from 9.4%.   Recent labs reviewed and discussed with her.   - I have re-counseled the patient on diet management   by adopting a carbohydrate restricted / protein rich  Diet.  - Suggestion is made for patient to avoid simple carbohydrates   from their diet including Cakes , Desserts, Ice Cream,  Soda (  diet and regular) , Sweet Tea , Candies,  Chips, Cookies, Artificial Sweeteners,   and "Sugar-free" Products .  This will help patient to have stable blood glucose profile and potentially avoid unintended  Weight gain.  - Patient is advised to stick to a routine mealtimes to eat 3 meals  a day and avoid unnecessary snacks ( to snack only to correct hypoglycemia).   - I have approached patient with the following individualized plan to manage diabetes and patient agrees.  - She came with Improved , near target blood glucose profile.  - I will continue basal insulin Levemir  60  units QHS, prandial insulin with NovoLog 10 units 3 times a day before meals for pre-meal blood glucose above 90 mg/dL. She is advised to initiate strict monitoring of blood glucose before meals and at bedtime associated with strict monitoring of glucose  AC and HS.   -Patient is encouraged to call clinic for blood glucose levels less than 70 or above 300 mg /dl. - I will continue metformin 500 mg by mouth twice a day and Invokana 100 mg by mouth every morning, therapeutically suitable for patient.  - Patient specific target  for A1c; LDL, HDL, Triglycerides, and  Waist Circumference were discussed in detail.  2) BP/HTN: Controlled. Continue current medications including ACEI/ARB. 3) Lipids/HPL:  continue statins.  4)   Postsurgical hypothyroidism -She is on levothyroxine 125 g by mouth every morning. Her thyroid function tests are consistent with appropriate replacement, she is clinically euthyroid. I advised her to continue on the same dose.  - We discussed about correct intake of levothyroxine, at fasting, with water, separated by at least 30 minutes from breakfast, and separated by more than 4 hours from calcium, iron, multivitamins, acid reflux medications (PPIs). -Patient is made aware of the fact that thyroid hormone replacement is needed for life, dose to be adjusted by periodic monitoring of thyroid function tests.  5)  Weight/Diet:  Gaining weight, declines to see the dietitian, exercise,  and carbohydrates information provided.  6) Chronic Care/Health Maintenance:  -Patient is on ACEI/ARB and Statin medications and encouraged to continue to follow up with Ophthalmology, Podiatrist at least yearly or according to recommendations, and advised to quit smoking. I have recommended yearly flu vaccine and pneumonia vaccination at least every 5 years; moderate intensity exercise for up to 150 minutes weekly; and  sleep for at least 7 hours a day.  - 25 minutes of time was spent on the care of this patient , 50% of which was applied for counseling on diabetes complications and their preventions.  - I advised patient to maintain close follow up with Josue Hector, MD for primary care needs.  Patient is asked to bring meter and  blood glucose logs during their next visit.   Follow up plan: -Return in about 3 months (around 06/18/2016) for follow up with pre-visit labs, meter, and logs.  Marquis Lunch, MD Phone: [phone]  Fax: [phone]   03/20/2016, 11:35 AM

## 2016-03-21 ENCOUNTER — Other Ambulatory Visit: Payer: Self-pay | Admitting: "Endocrinology

## 2016-03-26 DIAGNOSIS — F3342 Major depressive disorder, recurrent, in full remission: Secondary | ICD-10-CM | POA: Insufficient documentation

## 2016-04-15 ENCOUNTER — Other Ambulatory Visit: Payer: Self-pay | Admitting: "Endocrinology

## 2016-04-27 ENCOUNTER — Other Ambulatory Visit: Payer: Self-pay | Admitting: "Endocrinology

## 2016-05-05 ENCOUNTER — Ambulatory Visit (HOSPITAL_COMMUNITY)
Admission: RE | Admit: 2016-05-05 | Discharge: 2016-05-05 | Disposition: A | Discharge status: Home / Self Care | Payer: Medicare Other | Source: Ambulatory Visit | Attending: Adult Health Nurse Practitioner | Admitting: Adult Health Nurse Practitioner

## 2016-05-05 ENCOUNTER — Other Ambulatory Visit (HOSPITAL_COMMUNITY): Payer: Self-pay | Admitting: Adult Health Nurse Practitioner

## 2016-05-05 DIAGNOSIS — M542 Cervicalgia: Secondary | ICD-10-CM | POA: Insufficient documentation

## 2016-05-05 DIAGNOSIS — I700 Atherosclerosis of aorta: Secondary | ICD-10-CM | POA: Insufficient documentation

## 2016-05-05 DIAGNOSIS — R0602 Shortness of breath: Secondary | ICD-10-CM | POA: Insufficient documentation

## 2016-05-05 DIAGNOSIS — M4802 Spinal stenosis, cervical region: Secondary | ICD-10-CM | POA: Diagnosis not present

## 2016-05-05 DIAGNOSIS — M50321 Other cervical disc degeneration at C4-C5 level: Secondary | ICD-10-CM | POA: Diagnosis not present

## 2016-05-06 ENCOUNTER — Telehealth: Payer: Self-pay

## 2016-05-06 NOTE — Telephone Encounter (Signed)
SENT NOTES TO SCHEDULING 

## 2016-05-12 ENCOUNTER — Telehealth: Payer: Self-pay | Admitting: Cardiology

## 2016-05-12 NOTE — Telephone Encounter (Signed)
[facility]/2016 [facility] with Dr. Antoine PocheHochrein [place].  cbr

## 2016-05-16 DIAGNOSIS — M48062 Spinal stenosis, lumbar region with neurogenic claudication: Secondary | ICD-10-CM | POA: Insufficient documentation

## 2016-05-16 DIAGNOSIS — M5416 Radiculopathy, lumbar region: Secondary | ICD-10-CM | POA: Insufficient documentation

## 2016-05-27 ENCOUNTER — Other Ambulatory Visit: Payer: Self-pay | Admitting: "Endocrinology

## 2016-06-17 ENCOUNTER — Other Ambulatory Visit: Payer: Self-pay | Admitting: "Endocrinology

## 2016-06-17 LAB — COMPREHENSIVE METABOLIC PANEL
ALK PHOS: 25 U/L — AB (ref 33–130)
ALT: 14 U/L (ref 6–29)
AST: 16 U/L (ref 10–35)
Albumin: 3.7 g/dL (ref 3.6–5.1)
BUN: 24 mg/dL (ref 7–25)
CO2: 23 mmol/L (ref 20–31)
Calcium: 8.9 mg/dL (ref 8.6–10.4)
Chloride: 105 mmol/L (ref 98–110)
Creat: 1.33 mg/dL — ABNORMAL HIGH (ref 0.50–0.99)
GLUCOSE: 85 mg/dL (ref 65–99)
POTASSIUM: 4.9 mmol/L (ref 3.5–5.3)
Sodium: 137 mmol/L (ref 135–146)
Total Bilirubin: 0.3 mg/dL (ref 0.2–1.2)
Total Protein: 6.2 g/dL (ref 6.1–8.1)

## 2016-06-17 LAB — T4, FREE: Free T4: 1.1 ng/dL (ref 0.8–1.8)

## 2016-06-17 LAB — TSH: TSH: 1.22 mIU/L

## 2016-06-17 NOTE — Progress Notes (Deleted)
Cardiology Office Note   Date:  06/17/2016   ID:  Linda, Riggs 1955/11/07, MRN [id number]  PCP:  Linda Hector, MD  Cardiologist:   Linda Rotunda, MD  Referring:  ***  No chief complaint on file.     History of Present Illness: Linda Riggs is a 61 y.o. female who presents for ***  The patient was seen by Dr. Diona Riggs in 2012.   He had a negative stress test.  ***  Past Medical History:  Diagnosis Date  . Arthritis   . Chronic headaches   . Depression   . Diverticulosis   . Fibromyalgia   . GERD (gastroesophageal reflux disease)   . Goiter   . Internal hemorrhoids   . Iron deficiency anemia    Dr. Arlyce Riggs 11/10  . Osteoporosis   . Ruptured lumbar disc   . Type 2 diabetes mellitus (HCC)     Past Surgical History:  Procedure Laterality Date  . Arthroscopic left knee surgery Bilateral   . DILATION AND CURETTAGE OF UTERUS    . THYROIDECTOMY N/A 06/06/2013   Procedure: TOTAL THYROIDECTOMY;  Surgeon: Linda Heading, MD;  Location: [facility];  Service: General;  Laterality: N/A;     Current Outpatient Prescriptions  Medication Sig Dispense Refill  . alendronate (FOSAMAX) 70 MG tablet Take 70 mg by mouth every Sunday. Take with a full glass of water on an empty stomach.     Marland Kitchen atorvastatin (LIPITOR) 80 MG tablet Take 80 mg by mouth daily.    . Calcium Carbonate-Vit D-Min (CALCIUM 600 + MINERALS) 600-200 MG-UNIT TABS Take 1 tablet by mouth 2 (two) times daily.     Marland Kitchen escitalopram (LEXAPRO) 20 MG tablet Take 20 mg by mouth daily.      Marland Kitchen esomeprazole (NEXIUM) 40 MG capsule Take 40 mg by mouth daily.     . fenofibrate 160 MG tablet Take 160 mg by mouth daily.     . fexofenadine (ALLEGRA) 180 MG tablet Take 180 mg by mouth daily.      Marland Kitchen gabapentin (NEURONTIN) 300 MG capsule Take 300 mg by mouth 3 (three) times daily.    Marland Kitchen glucose blood (ONE TOUCH ULTRA TEST) test strip 1 each by Other route 4 (four) times daily. for testing 150 each 5  . HUMALOG KWIKPEN 100  UNIT/ML KiwkPen INJECT 10-16 UNITS 3 TIMES DAILY WITH MEALS 15 mL 2  . HYDROcodone-acetaminophen (NORCO/VICODIN) 5-325 MG tablet Take 1-2 tablets by mouth every 6 (six) hours as needed. 20 tablet 0  . INVOKANA 100 MG TABS tablet TAKE ONE (1) TABLET EACH DAY 30 tablet 2  . LEVEMIR FLEXTOUCH 100 UNIT/ML Pen INJECT 60 UNITS SQ AT 10 PM 15 mL 2  . levothyroxine (SYNTHROID, LEVOTHROID) 125 MCG tablet TAKE ONE TABLET EVERY MORNING 30 tablet 2  . meloxicam (MOBIC) 7.5 MG tablet Take 7.5 mg by mouth daily as needed for pain.    . metFORMIN (GLUCOPHAGE-XR) 500 MG 24 hr tablet TAKE ONE TABLET BY MOUTH TWICE DAILY 60 tablet 2  . methocarbamol (ROBAXIN) 500 MG tablet Take 500 mg by mouth every 8 (eight) hours as needed for muscle spasms.     . potassium gluconate 595 MG TABS Take 595 mg by mouth daily.     . promethazine (PHENERGAN) 25 MG tablet Take 1 tablet (25 mg total) by mouth every 6 (six) hours as needed. 12 tablet 1  . quinapril (ACCUPRIL) 10 MG tablet Take 10 mg by mouth daily.     Marland Kitchen  traZODone (DESYREL) 100 MG tablet Take 100-300 mg by mouth at bedtime.      No current facility-administered medications for this visit.     Allergies:   Aspirin; Flonase [fluticasone propionate]; Prednisone; and Tramadol    Social History:  The patient  reports that she quit smoking about 13 months ago. Her smoking use included Cigarettes. She has a 10.00 pack-year smoking history. She has never used smokeless tobacco. She reports that she does not drink alcohol or use drugs.   Family History:  The patient's ***family history includes Alzheimer's disease in her mother; Cancer in her father; Diabetes in her brother, sister, and sister; Diabetes type II in her father, mother, and sister.    ROS:  Please see the history of present illness.   Otherwise, review of systems are positive for {NONE DEFAULTED:18576::"none"}.   All other systems are reviewed and negative.    PHYSICAL EXAM: VS:  There were no vitals taken  for this visit. , BMI There is no height or weight on file to calculate BMI. GENERAL:  Well appearing HEENT:  Pupils equal round and reactive, fundi not visualized, oral mucosa unremarkable NECK:  No jugular venous distention, waveform within normal limits, carotid upstroke brisk and symmetric, no bruits, no thyromegaly LYMPHATICS:  No cervical, inguinal adenopathy LUNGS:  Clear to auscultation bilaterally BACK:  No CVA tenderness CHEST:  Unremarkable HEART:  PMI not displaced or sustained,S1 and S2 within normal limits, no S3, no S4, no clicks, no rubs, *** murmurs ABD:  Flat, positive bowel sounds normal in frequency in pitch, no bruits, no rebound, no guarding, no midline pulsatile mass, no hepatomegaly, no splenomegaly EXT:  2 plus pulses throughout, no edema, no cyanosis no clubbing SKIN:  No rashes no nodules NEURO:  Cranial nerves II through XII grossly intact, motor grossly intact throughout PSYCH:  Cognitively intact, oriented to person place and time    EKG:  EKG {ACTION; IS/IS [id number]}OT:[id number]} ordered today. The ekg ordered today demonstrates ***   Recent Labs: 03/13/2016: ALT 14; BUN 25; Creat 1.38; Potassium 4.6; Sodium 139; TSH 0.80    Lipid Panel    Component Value Date/Time   CHOL 192 03/13/2016 1015   TRIG 164 (H) 03/13/2016 1015   HDL 56 03/13/2016 1015   CHOLHDL 3.4 03/13/2016 1015   VLDL 33 (H) 03/13/2016 1015   LDLCALC 103 (H) 03/13/2016 1015      Wt Readings from Last 3 Encounters:  03/20/16 183 lb (83 kg)  01/25/16 180 lb (81.6 kg)  12/18/15 182 lb (82.6 kg)      Other studies Reviewed: Additional studies/ records that were reviewed today include: ***. Review of the above records demonstrates:  Please see elsewhere in the note.  ***   ASSESSMENT AND PLAN:  ***   Current medicines are reviewed at length with the patient today.  The patient {ACTIONS; HAS/DOES NOT HAVE:19233} concerns regarding medicines.  The following changes have been made:   {PLAN; NO CHANGE:13088:s}  Labs/ tests ordered today include: *** No orders of the defined types were placed in this encounter.    Disposition:   FU with ***    Signed, Linda RotundaJames Samanthajo Payano, MD  06/17/2016 9:14 PM    [facility]

## 2016-06-18 ENCOUNTER — Ambulatory Visit: Payer: Medicare Other | Admitting: Cardiology

## 2016-06-18 LAB — HEMOGLOBIN A1C
Hgb A1c MFr Bld: 6.9 % — ABNORMAL HIGH (ref <5.7)
MEAN PLASMA GLUCOSE: 151 mg/dL

## 2016-06-23 ENCOUNTER — Encounter: Payer: Self-pay | Admitting: "Endocrinology

## 2016-06-23 ENCOUNTER — Ambulatory Visit (INDEPENDENT_AMBULATORY_CARE_PROVIDER_SITE_OTHER): Payer: Medicare Other | Admitting: "Endocrinology

## 2016-06-23 VITALS — BP 137/70 | HR 78 | Ht 62.0 in | Wt 189.0 lb

## 2016-06-23 DIAGNOSIS — E890 Postprocedural hypothyroidism: Secondary | ICD-10-CM

## 2016-06-23 DIAGNOSIS — E1165 Type 2 diabetes mellitus with hyperglycemia: Secondary | ICD-10-CM

## 2016-06-23 DIAGNOSIS — IMO0002 Reserved for concepts with insufficient information to code with codable children: Secondary | ICD-10-CM

## 2016-06-23 DIAGNOSIS — E118 Type 2 diabetes mellitus with unspecified complications: Secondary | ICD-10-CM | POA: Diagnosis not present

## 2016-06-23 DIAGNOSIS — Z794 Long term (current) use of insulin: Secondary | ICD-10-CM

## 2016-06-23 DIAGNOSIS — I10 Essential (primary) hypertension: Secondary | ICD-10-CM

## 2016-06-23 DIAGNOSIS — E782 Mixed hyperlipidemia: Secondary | ICD-10-CM

## 2016-06-23 NOTE — Progress Notes (Signed)
Subjective:    Patient ID: Linda Riggs, female    DOB: 1955/05/17, PCP Josue Hector, MD   Past Medical History:  Diagnosis Date  . Arthritis   . Chronic headaches   . Depression   . Diverticulosis   . Fibromyalgia   . GERD (gastroesophageal reflux disease)   . Goiter   . Internal hemorrhoids   . Iron deficiency anemia    Dr. Arlyce Dice 11/10  . Osteoporosis   . Ruptured lumbar disc   . Type 2 diabetes mellitus (HCC)    Past Surgical History:  Procedure Laterality Date  . Arthroscopic left knee surgery Bilateral   . DILATION AND CURETTAGE OF UTERUS    . THYROIDECTOMY N/A 06/06/2013   Procedure: TOTAL THYROIDECTOMY;  Surgeon: Dalia Heading, MD;  Location: [facility];  Service: General;  Laterality: N/A;   Social History   Social History  . Marital status: Divorced    Spouse name: N/A  . Number of children: N/A  . Years of education: N/A   Social History Main Topics  . Smoking status: Former Smoker    Packs/day: 0.50    Years: 20.00    Types: Cigarettes    Quit date: 04/27/2015  . Smokeless tobacco: Never Used  . Alcohol use No  . Drug use: No  . Sexual activity: Yes    Birth control/ protection: Post-menopausal   Other Topics Concern  . None   Social History Narrative  . None   Outpatient Encounter Prescriptions as of 06/23/2016  Medication Sig  . alendronate (FOSAMAX) 70 MG tablet Take 70 mg by mouth every Sunday. Take with a full glass of water on an empty stomach.   Marland Kitchen atorvastatin (LIPITOR) 80 MG tablet Take 80 mg by mouth daily.  . Calcium Carbonate-Vit D-Min (CALCIUM 600 + MINERALS) 600-200 MG-UNIT TABS Take 1 tablet by mouth 2 (two) times daily.   Marland Kitchen escitalopram (LEXAPRO) 20 MG tablet Take 20 mg by mouth daily.    Marland Kitchen esomeprazole (NEXIUM) 40 MG capsule Take 40 mg by mouth daily.   . fenofibrate 160 MG tablet Take 160 mg by mouth daily.   . fexofenadine (ALLEGRA) 180 MG tablet Take 180 mg by mouth daily.    Marland Kitchen gabapentin (NEURONTIN) 300 MG  capsule Take 300 mg by mouth 3 (three) times daily.  Marland Kitchen glucose blood (ONE TOUCH ULTRA TEST) test strip 1 each by Other route 4 (four) times daily. for testing  . HUMALOG KWIKPEN 100 UNIT/ML KiwkPen INJECT 10-16 UNITS 3 TIMES DAILY WITH MEALS  . HYDROcodone-acetaminophen (NORCO/VICODIN) 5-325 MG tablet Take 1-2 tablets by mouth every 6 (six) hours as needed.  . INVOKANA 100 MG TABS tablet TAKE ONE (1) TABLET EACH DAY  . LEVEMIR FLEXTOUCH 100 UNIT/ML Pen INJECT 60 UNITS SQ AT 10 PM  . levothyroxine (SYNTHROID, LEVOTHROID) 125 MCG tablet TAKE ONE TABLET EVERY MORNING  . meloxicam (MOBIC) 7.5 MG tablet Take 7.5 mg by mouth daily as needed for pain.  . metFORMIN (GLUCOPHAGE-XR) 500 MG 24 hr tablet TAKE ONE TABLET BY MOUTH TWICE DAILY  . methocarbamol (ROBAXIN) 500 MG tablet Take 500 mg by mouth every 8 (eight) hours as needed for muscle spasms.   . potassium gluconate 595 MG TABS Take 595 mg by mouth daily.   . promethazine (PHENERGAN) 25 MG tablet Take 1 tablet (25 mg total) by mouth every 6 (six) hours as needed.  . quinapril (ACCUPRIL) 10 MG tablet Take 10 mg by mouth daily.   Marland Kitchen  traZODone (DESYREL) 100 MG tablet Take 100-300 mg by mouth at bedtime.    No facility-administered encounter medications on file as of 06/23/2016.    ALLERGIES: Allergies  Allergen Reactions  . Aspirin Itching  . Flonase [Fluticasone Propionate] Itching  . Prednisone Itching  . Tramadol Itching   VACCINATION STATUS:  There is no immunization history on file for this patient.  Diabetes  She presents for her follow-up diabetic visit. She has type 2 diabetes mellitus. Her disease course has been improving. Pertinent negatives for hypoglycemia include no confusion, headaches, pallor or seizures. Associated symptoms include fatigue. Pertinent negatives for diabetes include no chest pain, no polydipsia, no polyphagia and no polyuria. Symptoms are improving. There are no diabetic complications. Risk factors for coronary  artery disease include diabetes mellitus, dyslipidemia, tobacco exposure, sedentary lifestyle and hypertension. Current diabetic treatment includes insulin injections and oral agent (dual therapy). She is compliant with treatment most of the time. Her weight is increasing steadily. She is following a generally unhealthy diet. When asked about meal planning, she reported none. She rarely participates in exercise. Home blood sugar record trend: She did not bring her logs. Her overall blood glucose range is 140-180 mg/dl. An ACE inhibitor/angiotensin II receptor blocker is being taken.  Thyroid Problem  Presents for follow-up visit. Symptoms include fatigue. Patient reports no cold intolerance, diarrhea, heat intolerance or palpitations. The symptoms have been stable. Past treatments include levothyroxine. Prior procedures include thyroidectomy. Her past medical history is significant for diabetes and hyperlipidemia.  Hypertension  This is a chronic problem. The current episode started more than 1 year ago. Pertinent negatives include no chest pain, headaches, palpitations or shortness of breath. Past treatments include ACE inhibitors. Identifiable causes of hypertension include a thyroid problem.  Hyperlipidemia  This is a chronic problem. The current episode started more than 1 year ago. Exacerbating diseases include diabetes. Pertinent negatives include no chest pain, myalgias or shortness of breath. Current antihyperlipidemic treatment includes statins. Risk factors for coronary artery disease include diabetes mellitus, dyslipidemia and hypertension.     Review of Systems  Constitutional: Positive for fatigue. Negative for unexpected weight change.  HENT: Negative for trouble swallowing and voice change.   Eyes: Negative for visual disturbance.  Respiratory: Negative for cough, shortness of breath and wheezing.   Cardiovascular: Negative for chest pain, palpitations and leg swelling.   Gastrointestinal: Negative for diarrhea, nausea and vomiting.  Endocrine: Negative for cold intolerance, heat intolerance, polydipsia, polyphagia and polyuria.  Musculoskeletal: Negative for arthralgias and myalgias.  Skin: Negative for color change, pallor, rash and wound.  Neurological: Negative for seizures and headaches.  Psychiatric/Behavioral: Negative for confusion and suicidal ideas.    Objective:    BP 137/70   Pulse 78   Ht 5\' 2"  (1.575 m)   Wt 189 lb (85.7 kg)   BMI 34.57 kg/m   Wt Readings from Last 3 Encounters:  06/23/16 189 lb (85.7 kg)  03/20/16 183 lb (83 kg)  01/25/16 180 lb (81.6 kg)    Physical Exam  Constitutional: She is oriented to person, place, and time. She appears well-developed.  HENT:  Head: Normocephalic and atraumatic.  Eyes: EOM are normal.  Neck: Normal range of motion. Neck supple. No tracheal deviation present. No thyromegaly present.  Cardiovascular: Normal rate and regular rhythm.   Pulmonary/Chest: Effort normal and breath sounds normal.  Abdominal: Soft. Bowel sounds are normal. There is no tenderness. There is no guarding.  Musculoskeletal: Normal range of motion. She exhibits no  edema.  Neurological: She is alert and oriented to person, place, and time. She has normal reflexes. No cranial nerve deficit. Coordination normal.  Skin: Skin is warm and dry. No rash noted. No erythema. No pallor.  Psychiatric: She has a normal mood and affect. Judgment normal.     Chemistry (most recent): Lab Results  Component Value Date   NA 137 06/17/2016   K 4.9 06/17/2016   CL 105 06/17/2016   CO2 23 06/17/2016   BUN 24 06/17/2016   CREATININE 1.33 (H) 06/17/2016   Diabetic Labs (most recent): Lab Results  Component Value Date   HGBA1C 6.9 (H) 06/17/2016   HGBA1C 7.0 (H) 03/13/2016   HGBA1C 7.0 (H) 12/11/2015     Assessment & Plan:   1. Uncontrolled type 2 diabetes mellitus with complication, with long-term current use of insulin  (HCC) - patient remains at a high risk for more acute and chronic complications of diabetes which include CAD, CVA, CKD, retinopathy, and neuropathy. These are all discussed in detail with the patient.  Patient came with controlled  glucose profile,  A1c improving to 6.9% from 9.4%.   Recent labs reviewed and discussed with her.   - I have re-counseled the patient on diet management   by adopting a carbohydrate restricted / protein rich  Diet.  - Suggestion is made for patient to avoid simple carbohydrates   from their diet including Cakes , Desserts, Ice Cream,  Soda (  diet and regular) , Sweet Tea , Candies,  Chips, Cookies, Artificial Sweeteners,   and "Sugar-free" Products .  This will help patient to have stable blood glucose profile and potentially avoid unintended  Weight gain.  - Patient is advised to stick to a routine mealtimes to eat 3 meals  a day and avoid unnecessary snacks ( to snack only to correct hypoglycemia).   - I have approached patient with the following individualized plan to manage diabetes and patient agrees.  - She came with Improved , near target blood glucose profile.  - I will lower levemir to 50   units QHS, lower prandial insulin with NovoLog to 5 units 3 times a day before meals for pre-meal blood glucose above 90 mg/dL. She is advised to initiate strict monitoring of blood glucose before meals and at bedtime associated with strict monitoring of glucose  AC and HS.   -Patient is encouraged to call clinic for blood glucose levels less than 70 or above 300 mg /dl. - I will continue metformin 500 mg by mouth twice a day and Invokana 100 mg by mouth every morning, therapeutically suitable for patient.  - Patient specific target  for A1c; LDL, HDL, Triglycerides, and  Waist Circumference were discussed in detail.  2) BP/HTN: Controlled. Continue current medications including ACEI/ARB.  3) Lipids/HPL:  continue statins.  4)  Postsurgical  hypothyroidism  -She is on levothyroxine 125 g by mouth every morning. Her thyroid function tests are consistent with appropriate replacement, she is clinically euthyroid. I advised her to continue on the same dose.  - We discussed about correct intake of levothyroxine, at fasting, with water, separated by at least 30 minutes from breakfast, and separated by more than 4 hours from calcium, iron, multivitamins, acid reflux medications (PPIs). -Patient is made aware of the fact that thyroid hormone replacement is needed for life, dose to be adjusted by periodic monitoring of thyroid function tests.  5)  Weight/Diet:  Gaining weight, declines to see the dietitian, exercise, and carbohydrates  information provided.  6) Chronic Care/Health Maintenance:  -Patient is on ACEI/ARB and Statin medications and encouraged to continue to follow up with Ophthalmology, Podiatrist at least yearly or according to recommendations, and advised to quit smoking. I have recommended yearly flu vaccine and pneumonia vaccination at least every 5 years; moderate intensity exercise for up to 150 minutes weekly; and  sleep for at least 7 hours a day.  - 25 minutes of time was spent on the care of this patient , 50% of which was applied for counseling on diabetes complications and their preventions.  - I advised patient to maintain close follow up with Josue HectorNYLAND,LEONARD ROBERT, MD for primary care needs.  Patient is asked to bring meter and  blood glucose logs during their next visit.   Follow up plan: -Return in about 3 months (around 09/23/2016) for follow up with pre-visit labs, meter, and logs.  Marquis LunchGebre Nida, MD Phone: [phone]  Fax: [phone]   06/23/2016, 2:36 PM

## 2016-06-23 NOTE — Patient Instructions (Signed)

## 2016-06-25 ENCOUNTER — Other Ambulatory Visit: Payer: Self-pay | Admitting: "Endocrinology

## 2016-07-16 ENCOUNTER — Other Ambulatory Visit: Payer: Self-pay | Admitting: "Endocrinology

## 2016-07-23 ENCOUNTER — Other Ambulatory Visit: Payer: Self-pay | Admitting: "Endocrinology

## 2016-07-30 ENCOUNTER — Other Ambulatory Visit: Payer: Self-pay | Admitting: "Endocrinology

## 2016-08-11 DIAGNOSIS — Z9889 Other specified postprocedural states: Secondary | ICD-10-CM | POA: Insufficient documentation

## 2016-09-30 ENCOUNTER — Other Ambulatory Visit: Payer: Self-pay | Admitting: "Endocrinology

## 2016-09-30 LAB — COMPREHENSIVE METABOLIC PANEL
ALBUMIN: 3.9 g/dL (ref 3.6–5.1)
ALK PHOS: 36 U/L (ref 33–130)
ALT: 16 U/L (ref 6–29)
AST: 17 U/L (ref 10–35)
BUN: 28 mg/dL — ABNORMAL HIGH (ref 7–25)
CO2: 23 mmol/L (ref 20–31)
CREATININE: 1.43 mg/dL — AB (ref 0.50–0.99)
Calcium: 9 mg/dL (ref 8.6–10.4)
Chloride: 104 mmol/L (ref 98–110)
Glucose, Bld: 163 mg/dL — ABNORMAL HIGH (ref 65–99)
POTASSIUM: 4.5 mmol/L (ref 3.5–5.3)
Sodium: 137 mmol/L (ref 135–146)
TOTAL PROTEIN: 6.5 g/dL (ref 6.1–8.1)
Total Bilirubin: 0.2 mg/dL (ref 0.2–1.2)

## 2016-10-01 LAB — HEMOGLOBIN A1C
HEMOGLOBIN A1C: 7 % — AB (ref <5.7)
Mean Plasma Glucose: 154 mg/dL

## 2016-10-06 DIAGNOSIS — Z87891 Personal history of nicotine dependence: Secondary | ICD-10-CM | POA: Insufficient documentation

## 2016-10-07 ENCOUNTER — Telehealth: Payer: Self-pay

## 2016-10-07 ENCOUNTER — Ambulatory Visit (INDEPENDENT_AMBULATORY_CARE_PROVIDER_SITE_OTHER): Payer: Medicare Other | Admitting: "Endocrinology

## 2016-10-07 ENCOUNTER — Encounter: Payer: Self-pay | Admitting: "Endocrinology

## 2016-10-07 VITALS — Ht 62.0 in | Wt 187.0 lb

## 2016-10-07 DIAGNOSIS — E890 Postprocedural hypothyroidism: Secondary | ICD-10-CM | POA: Diagnosis not present

## 2016-10-07 DIAGNOSIS — Z794 Long term (current) use of insulin: Secondary | ICD-10-CM

## 2016-10-07 DIAGNOSIS — E1165 Type 2 diabetes mellitus with hyperglycemia: Secondary | ICD-10-CM

## 2016-10-07 DIAGNOSIS — I10 Essential (primary) hypertension: Secondary | ICD-10-CM | POA: Diagnosis not present

## 2016-10-07 DIAGNOSIS — E782 Mixed hyperlipidemia: Secondary | ICD-10-CM

## 2016-10-07 DIAGNOSIS — E118 Type 2 diabetes mellitus with unspecified complications: Secondary | ICD-10-CM

## 2016-10-07 DIAGNOSIS — IMO0002 Reserved for concepts with insufficient information to code with codable children: Secondary | ICD-10-CM

## 2016-10-07 MED ORDER — INSULIN DETEMIR 100 UNIT/ML FLEXPEN: PEN_INJECTOR | SUBCUTANEOUS | 0 refills | Status: DC

## 2016-10-07 NOTE — Telephone Encounter (Signed)
SENT NOTES TO SCHEDULING 

## 2016-10-07 NOTE — Progress Notes (Signed)
Subjective:    Patient ID: Linda Riggs, female    DOB: 02/08/1956, PCP Joette CatchingNyland, Leonard, MD   Past Medical History:  Diagnosis Date  . Arthritis   . Chronic headaches   . Depression   . Diverticulosis   . Fibromyalgia   . GERD (gastroesophageal reflux disease)   . Goiter   . Internal hemorrhoids   . Iron deficiency anemia    Dr. Arlyce DiceKaplan 11/10  . Osteoporosis   . Ruptured lumbar disc   . Type 2 diabetes mellitus (HCC)    Past Surgical History:  Procedure Laterality Date  . Arthroscopic left knee surgery Bilateral   . DILATION AND CURETTAGE OF UTERUS    . THYROIDECTOMY N/A 06/06/2013   Procedure: TOTAL THYROIDECTOMY;  Surgeon: Dalia HeadingMark A Jenkins, MD;  Location: [facility];  Service: General;  Laterality: N/A;   Social History   Social History  . Marital status: Divorced    Spouse name: N/A  . Number of children: N/A  . Years of education: N/A   Social History Main Topics  . Smoking status: Former Smoker    Packs/day: 0.50    Years: 20.00    Types: Cigarettes    Quit date: 04/27/2015  . Smokeless tobacco: Never Used  . Alcohol use No  . Drug use: No  . Sexual activity: Yes    Birth control/ protection: Post-menopausal   Other Topics Concern  . None   Social History Narrative  . None   Outpatient Encounter Prescriptions as of 10/07/2016  Medication Sig  . alendronate (FOSAMAX) 70 MG tablet Take 70 mg by mouth every Sunday. Take with a full glass of water on an empty stomach.   Marland Kitchen. atorvastatin (LIPITOR) 80 MG tablet Take 80 mg by mouth daily.  . Calcium Carbonate-Vit D-Min (CALCIUM 600 + MINERALS) 600-200 MG-UNIT TABS Take 1 tablet by mouth 2 (two) times daily.   Marland Kitchen. escitalopram (LEXAPRO) 20 MG tablet Take 20 mg by mouth daily.    Marland Kitchen. esomeprazole (NEXIUM) 40 MG capsule Take 40 mg by mouth daily.   . fenofibrate 160 MG tablet Take 160 mg by mouth daily.   . fexofenadine (ALLEGRA) 180 MG tablet Take 180 mg by mouth daily.    Marland Kitchen. gabapentin (NEURONTIN) 300 MG capsule Take  300 mg by mouth 3 (three) times daily.  Marland Kitchen. HYDROcodone-acetaminophen (NORCO/VICODIN) 5-325 MG tablet Take 1-2 tablets by mouth every 6 (six) hours as needed.  . Insulin Detemir (LEVEMIR FLEXTOUCH) 100 UNIT/ML Pen INJECT 55 UNITS SQ AT 10PM  . INVOKANA 100 MG TABS tablet TAKE ONE (1) TABLET EACH DAY  . levothyroxine (SYNTHROID, LEVOTHROID) 125 MCG tablet TAKE ONE TABLET EVERY MORNING  . meloxicam (MOBIC) 7.5 MG tablet Take 7.5 mg by mouth daily as needed for pain.  . metFORMIN (GLUCOPHAGE-XR) 500 MG 24 hr tablet TAKE ONE TABLET BY MOUTH TWICE DAILY  . methocarbamol (ROBAXIN) 500 MG tablet Take 500 mg by mouth every 8 (eight) hours as needed for muscle spasms.   Letta Pate. ONETOUCH DELICA LANCETS 33G MISC 4 TIMES DAILY  . ONETOUCH VERIO test strip CHECK BLOOD 4 TIMES DAILY  . potassium gluconate 595 MG TABS Take 595 mg by mouth daily.   . promethazine (PHENERGAN) 25 MG tablet Take 1 tablet (25 mg total) by mouth every 6 (six) hours as needed.  . quinapril (ACCUPRIL) 10 MG tablet Take 10 mg by mouth daily.   . traZODone (DESYREL) 100 MG tablet Take 100-300 mg by mouth at bedtime.   .Marland Kitchen  ULTICARE SHORT PEN NEEDLES 31G X 8 MM MISC 4 TIMES DAILY  . [DISCONTINUED] HUMALOG KWIKPEN 100 UNIT/ML KiwkPen INJECT 10-16 UNITS 3 TIMES DAILY WITH MEALS  . [DISCONTINUED] LEVEMIR FLEXTOUCH 100 UNIT/ML Pen INJECT 60 UNITS SQ AT 10PM   No facility-administered encounter medications on file as of 10/07/2016.    ALLERGIES: Allergies  Allergen Reactions  . Aspirin Itching  . Flonase [Fluticasone Propionate] Itching  . Prednisone Itching  . Tramadol Itching   VACCINATION STATUS:  There is no immunization history on file for this patient.  Diabetes  She presents for her follow-up diabetic visit. She has type 2 diabetes mellitus. Her disease course has been improving. Pertinent negatives for hypoglycemia include no confusion, headaches, pallor or seizures. Associated symptoms include fatigue. Pertinent negatives for  diabetes include no chest pain, no polydipsia, no polyphagia and no polyuria. Symptoms are improving. There are no diabetic complications. Risk factors for coronary artery disease include diabetes mellitus, dyslipidemia, tobacco exposure, sedentary lifestyle and hypertension. Current diabetic treatment includes insulin injections and oral agent (dual therapy). She is compliant with treatment most of the time. Her weight is increasing steadily. She is following a generally unhealthy diet. When asked about meal planning, she reported none. She rarely participates in exercise. Home blood sugar record trend: She did not bring her logs. Her overall blood glucose range is 140-180 mg/dl. An ACE inhibitor/angiotensin II receptor blocker is being taken.  Thyroid Problem  Presents for follow-up visit. Symptoms include fatigue. Patient reports no cold intolerance, diarrhea, heat intolerance or palpitations. The symptoms have been stable. Past treatments include levothyroxine. Prior procedures include thyroidectomy. Her past medical history is significant for diabetes and hyperlipidemia.  Hypertension  This is a chronic problem. The current episode started more than 1 year ago. Pertinent negatives include no chest pain, headaches, palpitations or shortness of breath. Past treatments include ACE inhibitors. Identifiable causes of hypertension include a thyroid problem.  Hyperlipidemia  This is a chronic problem. The current episode started more than 1 year ago. Exacerbating diseases include diabetes. Pertinent negatives include no chest pain, myalgias or shortness of breath. Current antihyperlipidemic treatment includes statins. Risk factors for coronary artery disease include diabetes mellitus, dyslipidemia and hypertension.     Review of Systems  Constitutional: Positive for fatigue. Negative for unexpected weight change.  HENT: Negative for trouble swallowing and voice change.   Eyes: Negative for visual  disturbance.  Respiratory: Negative for cough, shortness of breath and wheezing.   Cardiovascular: Negative for chest pain, palpitations and leg swelling.  Gastrointestinal: Negative for diarrhea, nausea and vomiting.  Endocrine: Negative for cold intolerance, heat intolerance, polydipsia, polyphagia and polyuria.  Musculoskeletal: Negative for arthralgias and myalgias.  Skin: Negative for color change, pallor, rash and wound.  Neurological: Negative for seizures and headaches.  Psychiatric/Behavioral: Negative for confusion and suicidal ideas.    Objective:    Ht 5\' 2"  (1.575 m)   Wt 187 lb (84.8 kg)   BMI 34.20 kg/m   Wt Readings from Last 3 Encounters:  10/07/16 187 lb (84.8 kg)  06/23/16 189 lb (85.7 kg)  03/20/16 183 lb (83 kg)    Physical Exam  Constitutional: She is oriented to person, place, and time. She appears well-developed.  HENT:  Head: Normocephalic and atraumatic.  Eyes: EOM are normal.  Neck: Normal range of motion. Neck supple. No tracheal deviation present. No thyromegaly present.  Cardiovascular: Normal rate and regular rhythm.   Pulmonary/Chest: Effort normal and breath sounds normal.  Abdominal: Soft.  Bowel sounds are normal. There is no tenderness. There is no guarding.  Musculoskeletal: Normal range of motion. She exhibits no edema.  Neurological: She is alert and oriented to person, place, and time. She has normal reflexes. No cranial nerve deficit. Coordination normal.  Skin: Skin is warm and dry. No rash noted. No erythema. No pallor.  Psychiatric: She has a normal mood and affect. Judgment normal.     Chemistry (most recent): Lab Results  Component Value Date   NA 137 09/30/2016   K 4.5 09/30/2016   CL 104 09/30/2016   CO2 23 09/30/2016   BUN 28 (H) 09/30/2016   CREATININE 1.43 (H) 09/30/2016   Diabetic Labs (most recent): Lab Results  Component Value Date   HGBA1C 7.0 (H) 09/30/2016   HGBA1C 6.9 (H) 06/17/2016   HGBA1C 7.0 (H)  03/13/2016     Assessment & Plan:   1. Uncontrolled type 2 diabetes mellitus with complication, with long-term current use of insulin (HCC) - patient remains at a high risk for more acute and chronic complications of diabetes which include CAD, CVA, CKD, retinopathy, and neuropathy. These are all discussed in detail with the patient.  Patient came with controlled  glucose profile,  A1c stable at 7% from 9.4%.   Recent labs reviewed and discussed with her.   - I have re-counseled the patient on diet management   by adopting a carbohydrate restricted / protein rich  Diet.  - Suggestion is made for patient to avoid simple carbohydrates   from her diet including Cakes , Desserts, Ice Cream,  Soda (  diet and regular) , Sweet Tea , Candies,  Chips, Cookies, Artificial Sweeteners,   and "Sugar-free" Products .  This will help patient to have stable blood glucose profile and potentially avoid unintended  Weight gain.  - Patient is advised to stick to a routine mealtimes to eat 3 meals  a day and avoid unnecessary snacks ( to snack only to correct hypoglycemia).   - I have approached patient with the following individualized plan to manage diabetes and patient agrees.  - She came with Improved , near target blood glucose profile.  - I will increase  levemir to 55   units  daily at bedtime,  hold prandial insulin Humalog for now.  - Continue  strict monitoring of glucose  2 TIMES A DAY-BEFORE BREAKFAST AND AT BEDTIME.    -Patient is encouraged to call clinic for blood glucose levels less than 70 or above 300 mg /dl. - I will continue metformin 500 mg by mouth twice a day and Invokana 100 mg by mouth every morning, therapeutically suitable for patient.  - Patient specific target  for A1c; LDL, HDL, Triglycerides, and  Waist Circumference were discussed in detail.  2) BP/HTN: Controlled. Continue current medications including ACEI/ARB.  3) Lipids/HPL:  continue statins.  4)  Postsurgical  hypothyroidism  -She is on levothyroxine 125 g by mouth every morning. Her thyroid function tests are consistent with appropriate replacement, she is clinically euthyroid. I advised her to continue on the same dose.  - We discussed about correct intake of levothyroxine, at fasting, with water, separated by at least 30 minutes from breakfast, and separated by more than 4 hours from calcium, iron, multivitamins, acid reflux medications (PPIs). -Patient is made aware of the fact that thyroid hormone replacement is needed for life, dose to be adjusted by periodic monitoring of thyroid function tests.  5)  Weight/Diet:  Gaining weight, declines to see  the dietitian, exercise, and carbohydrates information provided.  6) Chronic Care/Health Maintenance:  -Patient is on ACEI/ARB and Statin medications and encouraged to continue to follow up with Ophthalmology, Podiatrist at least yearly or according to recommendations, and advised to quit smoking. I have recommended yearly flu vaccine and pneumonia vaccination at least every 5 years; moderate intensity exercise for up to 150 minutes weekly; and  sleep for at least 7 hours a day.  - 25 minutes of time was spent on the care of this patient , 50% of which was applied for counseling on diabetes complications and their preventions.  - I advised patient to maintain close follow up with Joette Catching, MD for primary care needs.  Patient is asked to bring meter and  blood glucose logs during her next visit.   Follow up plan: -Return in about 3 months (around 01/07/2017) for follow up with pre-visit labs, meter, and logs.  Marquis Lunch, MD Phone: [phone]  Fax: [phone]   10/07/2016, 2:22 PM

## 2016-10-07 NOTE — Patient Instructions (Signed)

## 2016-10-20 ENCOUNTER — Emergency Department (HOSPITAL_COMMUNITY): Payer: Medicare Other

## 2016-10-20 ENCOUNTER — Emergency Department (HOSPITAL_COMMUNITY)
Admission: EM | Admit: 2016-10-20 | Discharge: 2016-10-20 | Disposition: A | Discharge status: Home / Self Care | Payer: Medicare Other | Attending: Emergency Medicine | Admitting: Emergency Medicine

## 2016-10-20 ENCOUNTER — Encounter (HOSPITAL_COMMUNITY): Payer: Self-pay

## 2016-10-20 DIAGNOSIS — Z79899 Other long term (current) drug therapy: Secondary | ICD-10-CM | POA: Insufficient documentation

## 2016-10-20 DIAGNOSIS — R110 Nausea: Secondary | ICD-10-CM | POA: Insufficient documentation

## 2016-10-20 DIAGNOSIS — F17210 Nicotine dependence, cigarettes, uncomplicated: Secondary | ICD-10-CM | POA: Insufficient documentation

## 2016-10-20 DIAGNOSIS — R109 Unspecified abdominal pain: Secondary | ICD-10-CM | POA: Diagnosis present

## 2016-10-20 DIAGNOSIS — E119 Type 2 diabetes mellitus without complications: Secondary | ICD-10-CM | POA: Diagnosis not present

## 2016-10-20 DIAGNOSIS — Z794 Long term (current) use of insulin: Secondary | ICD-10-CM | POA: Insufficient documentation

## 2016-10-20 DIAGNOSIS — Z7984 Long term (current) use of oral hypoglycemic drugs: Secondary | ICD-10-CM | POA: Diagnosis not present

## 2016-10-20 LAB — LIPASE, BLOOD: Lipase: 33 U/L (ref 11–51)

## 2016-10-20 LAB — URINALYSIS, ROUTINE W REFLEX MICROSCOPIC
BILIRUBIN URINE: NEGATIVE
Glucose, UA: 50 mg/dL — AB
Hgb urine dipstick: NEGATIVE
KETONES UR: NEGATIVE mg/dL
LEUKOCYTES UA: NEGATIVE
NITRITE: NEGATIVE
PH: 5 (ref 5.0–8.0)
PROTEIN: NEGATIVE mg/dL
Specific Gravity, Urine: 1.026 (ref 1.005–1.030)

## 2016-10-20 LAB — COMPREHENSIVE METABOLIC PANEL
ALBUMIN: 4.2 g/dL (ref 3.5–5.0)
ALK PHOS: 32 U/L — AB (ref 38–126)
ALT: 17 U/L (ref 14–54)
AST: 27 U/L (ref 15–41)
Anion gap: 12 (ref 5–15)
BILIRUBIN TOTAL: 0.5 mg/dL (ref 0.3–1.2)
BUN: 28 mg/dL — AB (ref 6–20)
CALCIUM: 9.7 mg/dL (ref 8.9–10.3)
CO2: 23 mmol/L (ref 22–32)
CREATININE: 1.43 mg/dL — AB (ref 0.44–1.00)
Chloride: 101 mmol/L (ref 101–111)
GFR calc Af Amer: 45 mL/min — ABNORMAL LOW (ref >60)
GFR, EST NON AFRICAN AMERICAN: 39 mL/min — AB (ref >60)
GLUCOSE: 110 mg/dL — AB (ref 65–99)
POTASSIUM: 4 mmol/L (ref 3.5–5.1)
Sodium: 136 mmol/L (ref 135–145)
TOTAL PROTEIN: 8 g/dL (ref 6.5–8.1)

## 2016-10-20 LAB — CBC
HEMATOCRIT: 28.9 % — AB (ref 36.0–46.0)
Hemoglobin: 8.6 g/dL — ABNORMAL LOW (ref 12.0–15.0)
MCH: 20.6 pg — ABNORMAL LOW (ref 26.0–34.0)
MCHC: 29.8 g/dL — AB (ref 30.0–36.0)
MCV: 69.3 fL — ABNORMAL LOW (ref 78.0–100.0)
PLATELETS: 462 10*3/uL — AB (ref 150–400)
RBC: 4.17 MIL/uL (ref 3.87–5.11)
RDW: 20.2 % — AB (ref 11.5–15.5)
WBC: 9.8 10*3/uL (ref 4.0–10.5)

## 2016-10-20 MED ORDER — SODIUM CHLORIDE 0.9 % IV BOLUS (SEPSIS)
2000.0000 mL | Freq: Once | INTRAVENOUS | Status: AC
  Administered 2016-10-20: 2000 mL via INTRAVENOUS

## 2016-10-20 MED ORDER — ONDANSETRON HCL 4 MG/2ML IJ SOLN
4.0000 mg | Freq: Once | INTRAMUSCULAR | Status: AC | PRN
  Administered 2016-10-20: 4 mg via INTRAVENOUS
  Filled 2016-10-20: qty 2

## 2016-10-20 MED ORDER — IOPAMIDOL (ISOVUE-300) INJECTION 61%
75.0000 mL | Freq: Once | INTRAVENOUS | Status: AC | PRN
  Administered 2016-10-20: 75 mL via INTRAVENOUS

## 2016-10-20 MED ORDER — ONDANSETRON 4 MG PO TBDP: ORAL_TABLET | ORAL | 0 refills | Status: DC

## 2016-10-20 MED ORDER — SODIUM CHLORIDE 0.9 % IV BOLUS (SEPSIS)
1000.0000 mL | Freq: Once | INTRAVENOUS | Status: AC
  Administered 2016-10-20: 1000 mL via INTRAVENOUS

## 2016-10-20 MED ORDER — IOPAMIDOL (ISOVUE-300) INJECTION 61%: 100.0000 mL | Freq: Once | INTRAVENOUS | Status: DC | PRN

## 2016-10-20 NOTE — ED Notes (Signed)
PA student at the beside.

## 2016-10-20 NOTE — ED Notes (Signed)
EDP at the bedside, pt on monitor, family in the room

## 2016-10-20 NOTE — ED Triage Notes (Signed)
Pt reports 2 week history of fever, abdominal cramping with n/v/d. Referred by PCP.

## 2016-10-20 NOTE — ED Provider Notes (Signed)
[facility] Provider Note   CSN: [id number] Arrival date & time: 10/20/16  1007     History   Chief Complaint Chief Complaint  Patient presents with  . Abdominal Pain    HPI Linda Riggs is a 61 y.o. female.  Patient complains of nausea vomiting and diarrhea for 2 weeks but none today.   The history is provided by the patient.  Emesis   This is a new problem. The current episode started more than 2 days ago. The problem occurs 2 to 4 times per day. The problem has been resolved. The emesis has an appearance of stomach contents. There has been no fever. Associated symptoms include diarrhea. Pertinent negatives include no abdominal pain, no chills, no cough and no headaches. Risk factors include ill contacts.    Past Medical History:  Diagnosis Date  . Arthritis   . Chronic headaches   . Depression   . Diverticulosis   . Fibromyalgia   . GERD (gastroesophageal reflux disease)   . Goiter   . Internal hemorrhoids   . Iron deficiency anemia    Dr. Arlyce DiceKaplan 11/10  . Osteoporosis   . Ruptured lumbar disc   . Type 2 diabetes mellitus [facility](HCC)     Patient Active Problem List   Diagnosis Date Noted  . 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 Hyperlipidemia 03/07/2015  . Essential hypertension, benign 03/07/2015  . History of colonic polyps 09/20/2014  . Postoperative wound hematoma 06/08/2013  . Atypical chest pain 09/06/2010  . Tobacco abuse 09/06/2010  . Type 2 diabetes mellitus, uncontrolled (HCC) 01/31/2009  . Iron deficiency anemia 01/31/2009  . ESOPHAGEAL REFLUX 01/31/2009  . Dysphagia 01/31/2009    Past Surgical History:  Procedure Laterality Date  . Arthroscopic left knee surgery Bilateral   . BACK SURGERY    . DILATION AND CURETTAGE OF UTERUS    . THYROIDECTOMY N/A 06/06/2013   Procedure: TOTAL THYROIDECTOMY;  Surgeon: Dalia HeadingMark A Jenkins, MD;  Location: [facility];  Service: General;  Laterality: N/A;    OB History    No data available       Home  Medications    Prior to Admission medications   Medication Sig Start Date End Date Taking? Authorizing Provider  atorvastatin (LIPITOR) 80 MG tablet Take 80 mg by mouth daily.   Yes [provider]  escitalopram (LEXAPRO) 20 MG tablet Take 20 mg by mouth daily.     Yes [provider]  esomeprazole (NEXIUM) 40 MG capsule Take 40 mg by mouth daily.  01/25/16  Yes [provider]  fenofibrate 160 MG tablet Take 160 mg by mouth daily.  01/25/16  Yes [provider]  fexofenadine (ALLEGRA) 180 MG tablet Take 180 mg by mouth daily.     Yes [provider]  gabapentin (NEURONTIN) 300 MG capsule Take 300 mg by mouth 3 (three) times daily.   Yes [provider]  hydrochlorothiazide (HYDRODIURIL) 25 MG tablet Take 25 mg by mouth daily.   Yes [provider]  Insulin Detemir (LEVEMIR FLEXTOUCH) 100 UNIT/ML Pen INJECT 55 UNITS SQ AT 10PM 10/07/16  Yes Nida, Denman GeorgeGebreselassie W, MD  INVOKANA 100 MG TABS tablet TAKE ONE (1) TABLET EACH DAY 07/30/16  Yes Nida, Denman GeorgeGebreselassie W, MD  levothyroxine (SYNTHROID, LEVOTHROID) 125 MCG tablet TAKE ONE TABLET EVERY MORNING 07/30/16  Yes Nida, Denman GeorgeGebreselassie W, MD  meloxicam (MOBIC) 7.5 MG tablet Take 7.5 mg by mouth 2 (two) times daily.    Yes [provider]  metFORMIN (  GLUCOPHAGE-XR) 500 MG 24 hr tablet TAKE ONE TABLET BY MOUTH TWICE DAILY 07/30/16  Yes Nida, Denman George, MD  quinapril (ACCUPRIL) 10 MG tablet Take 10 mg by mouth daily.  01/25/16  Yes [provider]  traZODone (DESYREL) 100 MG tablet Take 100 mg by mouth at bedtime.    Yes [provider]  ondansetron (ZOFRAN ODT) 4 MG disintegrating tablet 4mg  ODT q4 hours prn nausea/vomit 10/20/16   Bethann Berkshire, MD  [facility] DELICA LANCETS 33G MISC 4 TIMES DAILY 07/30/16   Roma Kayser, MD  [facility] VERIO test strip CHECK BLOOD 4 TIMES DAILY 07/16/16   Roma Kayser, MD  [employer] SHORT PEN NEEDLES 31G X 8 MM MISC 4  TIMES DAILY 07/30/16   Roma Kayser, MD    Family History Family History  Problem Relation Age of Onset  . Diabetes type II Mother   . Alzheimer's disease Mother   . Diabetes type II Sister   . Diabetes Sister   . Diabetes type II Father   . Cancer Father        Lung  . Diabetes Brother   . Diabetes Sister     Social History Social History  Substance Use Topics  . Smoking status: Current Every Day Smoker    Packs/day: 0.50    Years: 20.00    Types: Cigarettes    Last attempt to quit: 04/27/2015  . Smokeless tobacco: Never Used  . Alcohol use No     Allergies   Aspirin; Flonase [fluticasone propionate]; Prednisone; and Tramadol   Review of Systems Review of Systems  Constitutional: Negative for appetite change, chills and fatigue.  HENT: Negative for congestion, ear discharge and sinus pressure.   Eyes: Negative for discharge.  Respiratory: Negative for cough.   Cardiovascular: Negative for chest pain.  Gastrointestinal: Positive for diarrhea and vomiting. Negative for abdominal pain.  Genitourinary: Negative for frequency and hematuria.  Musculoskeletal: Negative for back pain.  Skin: Negative for rash.  Neurological: Negative for seizures and headaches.  Psychiatric/Behavioral: Negative for hallucinations.     Physical Exam Updated Vital Signs BP (!) 147/64   Pulse 66   Temp 98.4 F (36.9 C) (Oral)   Resp 14   Ht 5\' 2"  (1.575 m)   Wt 79.4 kg (175 lb)   SpO2 99%   BMI 32.01 kg/m   Physical Exam  Constitutional: She is oriented to person, place, and time. She appears well-developed.  HENT:  Head: Normocephalic.  Eyes: Conjunctivae and EOM are normal. No scleral icterus.  Neck: Neck supple. No thyromegaly present.  Cardiovascular: Normal rate and regular rhythm.  Exam reveals no gallop and no friction rub.   No murmur heard. Pulmonary/Chest: No stridor. She has no wheezes. She has no rales. She exhibits no tenderness.  Abdominal: She  exhibits no distension. There is no tenderness. There is no rebound.  Musculoskeletal: Normal range of motion. She exhibits no edema.  Lymphadenopathy:    She has no cervical adenopathy.  Neurological: She is oriented to person, place, and time. She exhibits normal muscle tone. Coordination normal.  Skin: No rash noted. No erythema.  Psychiatric: She has a normal mood and affect. Her behavior is normal.     ED Treatments / Results  Labs (all labs ordered are listed, but only abnormal results are displayed) Labs Reviewed  COMPREHENSIVE METABOLIC PANEL - Abnormal; Notable for the following:       Result Value   Glucose, Bld 110 (*)  BUN 28 (*)    Creatinine, Ser 1.43 (*)    Alkaline Phosphatase 32 (*)    GFR calc non Af Amer 39 (*)    GFR calc Af Amer 45 (*)    All other components within normal limits  CBC - Abnormal; Notable for the following:    Hemoglobin 8.6 (*)    HCT 28.9 (*)    MCV 69.3 (*)    MCH 20.6 (*)    MCHC 29.8 (*)    RDW 20.2 (*)    Platelets 462 (*)    All other components within normal limits  URINALYSIS, ROUTINE W REFLEX MICROSCOPIC - Abnormal; Notable for the following:    Color, Urine STRAW (*)    Glucose, UA 50 (*)    All other components within normal limits  LIPASE, BLOOD    EKG  EKG Interpretation None       Radiology Ct Abdomen Pelvis W Contrast  Result Date: 10/20/2016 CLINICAL DATA:  Fever, abdominal cramping. EXAM: CT ABDOMEN AND PELVIS WITH CONTRAST TECHNIQUE: Multidetector CT imaging of the abdomen and pelvis was performed using the standard protocol following bolus administration of intravenous contrast. CONTRAST:  75mL ISOVUE-300 IOPAMIDOL (ISOVUE-300) INJECTION 61% COMPARISON:  None. FINDINGS: Lower chest: Heart is borderline in size. Scattered coronary artery calcifications in the visualized right coronary artery. Lung bases clear. No effusions. Hepatobiliary: Small layering gallstones in the gallbladder. No focal hepatic  abnormality. Pancreas: No focal abnormality or ductal dilatation. Spleen: No focal abnormality.  Normal size. Adrenals/Urinary Tract: 5.5 cm benign-appearing cyst in the upper pole of the right kidney. Smaller cyst in the lower pole the right kidney. No hydronephrosis. Adrenal glands and urinary bladder are unremarkable. Stomach/Bowel: Appendix is normal. Stomach, large and small bowel grossly unremarkable. Few scattered sigmoid diverticula. No active diverticulitis. Vascular/Lymphatic: Diffuse aortic and iliac calcifications. No aneurysm or adenopathy. Reproductive: Uterus and adnexa unremarkable.  No mass. Other: No free fluid or free air. Musculoskeletal: No acute bony abnormality. IMPRESSION: Cholelithiasis. Sigmoid diverticulosis. No acute findings in the abdomen or pelvis. Normal appendix. Aortoiliac atherosclerosis.  Coronary artery disease. Electronically Signed   By: Charlett Nose M.D.   On: 10/20/2016 12:53    Procedures Procedures (including critical care time)  Medications Ordered in ED Medications  iopamidol (ISOVUE-300) 61 % injection 100 mL (not administered)  ondansetron (ZOFRAN) injection 4 mg (4 mg Intravenous Given 10/20/16 1056)  sodium chloride 0.9 % bolus 2,000 mL (0 mLs Intravenous Stopped 10/20/16 1211)  iopamidol (ISOVUE-300) 61 % injection 75 mL (75 mLs Intravenous Contrast Given 10/20/16 1233)  sodium chloride 0.9 % bolus 1,000 mL (0 mLs Intravenous Stopped 10/20/16 1430)     Initial Impression / Assessment and Plan / ED Course  I have reviewed the triage vital signs and the nursing notes.  Pertinent labs & imaging results that were available during my care of the patient were reviewed by me and considered in my medical decision making (see chart for details).     Patient with nausea vomiting diarrhea that has improved. CT scan shows gallstones. Labs unremarkable. Patient will be given nausea medicine to take at home and is referred to GI  Final Clinical Impressions(s)  / ED Diagnoses   Final diagnoses:  Nausea    New Prescriptions New Prescriptions   ONDANSETRON (ZOFRAN ODT) 4 MG DISINTEGRATING TABLET    4mg  ODT q4 hours prn nausea/vomit     Bethann Berkshire, MD 10/20/16 1434

## 2016-10-20 NOTE — Discharge Instructions (Signed)
Follow-up with Dr. Darrick Pennafields in 1-2 weeks for recheck of nausea and diarrhea and your gallstones.

## 2016-10-28 ENCOUNTER — Other Ambulatory Visit: Payer: Self-pay | Admitting: "Endocrinology

## 2016-10-30 ENCOUNTER — Ambulatory Visit: Payer: Medicare Other | Admitting: Cardiology

## 2016-10-31 ENCOUNTER — Other Ambulatory Visit: Payer: Self-pay

## 2016-10-31 ENCOUNTER — Encounter: Payer: Self-pay | Admitting: Gastroenterology

## 2016-10-31 ENCOUNTER — Ambulatory Visit (INDEPENDENT_AMBULATORY_CARE_PROVIDER_SITE_OTHER): Payer: Medicare Other | Admitting: Gastroenterology

## 2016-10-31 DIAGNOSIS — R1011 Right upper quadrant pain: Secondary | ICD-10-CM | POA: Diagnosis not present

## 2016-10-31 DIAGNOSIS — K8020 Calculus of gallbladder without cholecystitis without obstruction: Secondary | ICD-10-CM | POA: Diagnosis not present

## 2016-10-31 DIAGNOSIS — K810 Acute cholecystitis: Secondary | ICD-10-CM

## 2016-10-31 MED ORDER — PROMETHAZINE HCL 12.5 MG PO TABS: 12.5000 mg | ORAL_TABLET | Freq: Four times a day (QID) | ORAL | 0 refills | Status: DC | PRN

## 2016-10-31 NOTE — Progress Notes (Signed)
Primary Care Physician:  Joette CatchingNyland, Leonard, MD Primary Gastroenterologist:  Dr. Darrick PennaFields   Chief Complaint  Patient presents with  . Abdominal Pain    right abd  . Nausea  . Emesis  . Constipation  . Weight Loss    lost 14 lbs in past month    HPI:   Linda Riggs is a 61 y.o. female presenting today at the request of her PCP secondary to abdominal pain. She has previously been seen by [facility] and fairly recently as of 2016. An appt was made for her with our practice, and it is unclear how this came to be as she was already established with [facility] for many years. Regardless, she has had a fairly recent colonoscopy and EGD in 2016 by Dr. Arlyce DiceKaplan, with mild diverticulosis on colonoscopy and early GE junction stricture s/p dilation. History of IDA. Most recent labs in epic with Hgb 8.6, microcytic anemia. Normal LFTs. Lipase normal. CT July 2018 with cholelithiasis.   Started getting sick about a month ago with nausea. Associated right-sided abdominal pain. Eats applesauce, jello. Once in awhile will try to eat a sandwich. One episode of vomiting. Nausea constant. Zofran "puts me to sleep". Was having diarrhea but this has resolved. Now bowels "barely moving at all". States she has lost about 14 lbs since she got sick. No overt GI bleeding. Excedrin for head. Taking Nexium once daily. Afebrile today. However, she states she had fever/chills when she was seen by primary care.   Past Medical History:  Diagnosis Date  . Arthritis   . Chronic headaches   . Depression   . Diverticulosis   . Fibromyalgia   . GERD (gastroesophageal reflux disease)   . Goiter   . Internal hemorrhoids   . Iron deficiency anemia    Dr. Arlyce DiceKaplan 11/10  . Osteoporosis   . Ruptured lumbar disc   . Type 2 diabetes mellitus (HCC)     Past Surgical History:  Procedure Laterality Date  . Arthroscopic left knee surgery Bilateral   . BACK SURGERY    . DILATION AND CURETTAGE OF UTERUS    . THYROIDECTOMY N/A  06/06/2013   Procedure: TOTAL THYROIDECTOMY;  Surgeon: Dalia HeadingMark A Jenkins, MD;  Location: [facility];  Service: General;  Laterality: N/A;    Current Outpatient Prescriptions  Medication Sig Dispense Refill  . atorvastatin (LIPITOR) 80 MG tablet Take 80 mg by mouth daily.    Marland Kitchen. escitalopram (LEXAPRO) 20 MG tablet Take 20 mg by mouth daily.      Marland Kitchen. esomeprazole (NEXIUM) 40 MG capsule Take 40 mg by mouth daily.     . fenofibrate 160 MG tablet Take 160 mg by mouth daily.     . fexofenadine (ALLEGRA) 180 MG tablet Take 180 mg by mouth daily.      Marland Kitchen. gabapentin (NEURONTIN) 300 MG capsule Take 300 mg by mouth 3 (three) times daily.    . hydrochlorothiazide (HYDRODIURIL) 25 MG tablet Take 25 mg by mouth daily.    . Insulin Detemir (LEVEMIR FLEXTOUCH) 100 UNIT/ML Pen INJECT 55 UNITS SQ AT 10PM 45 mL 0  . INVOKANA 100 MG TABS tablet TAKE ONE (1) TABLET EACH DAY 90 tablet 0  . levothyroxine (SYNTHROID, LEVOTHROID) 125 MCG tablet TAKE ONE TABLET EVERY MORNING 90 tablet 0  . meloxicam (MOBIC) 7.5 MG tablet Take 7.5 mg by mouth 2 (two) times daily.     . metFORMIN (GLUCOPHAGE-XR) 500 MG 24 hr tablet TAKE ONE TABLET BY MOUTH  TWICE DAILY 180 tablet 0  . ondansetron (ZOFRAN ODT) 4 MG disintegrating tablet 4mg  ODT q4 hours prn nausea/vomit 12 tablet 0  . ONETOUCH DELICA LANCETS 33G MISC 4 TIMES DAILY 100 each 0  . ONETOUCH VERIO test strip CHECK BLOOD 4 TIMES DAILY 150 each 5  . quinapril (ACCUPRIL) 10 MG tablet Take 10 mg by mouth daily.     . traZODone (DESYREL) 100 MG tablet Take 100 mg by mouth at bedtime.     Marland Kitchen. [employer] SHORT PEN NEEDLES 31G X 8 MM MISC 4 TIMES DAILY 100 each 2   No current facility-administered medications for this visit.     Allergies as of 10/31/2016 - Review Complete 10/31/2016  Allergen Reaction Noted  . Aspirin Itching 09/20/2014  . Flonase [fluticasone propionate] Itching 09/06/2010  . Prednisone Itching 09/06/2010  . Tramadol Itching 09/06/2010    Family History  Problem Relation  Age of Onset  . Diabetes type II Mother   . Alzheimer's disease Mother   . Diabetes type II Sister   . Diabetes Sister   . Diabetes type II Father   . Cancer Father        Lung  . Diabetes Brother   . Diabetes Sister     Social History   Social History  . Marital status: Divorced    Spouse name: N/A  . Number of children: N/A  . Years of education: N/A   Occupational History  . Not on file.   Social History Main Topics  . Smoking status: Current Every Day Smoker    Packs/day: 0.50    Years: 20.00    Types: Cigarettes    Last attempt to quit: 04/27/2015  . Smokeless tobacco: Never Used  . Alcohol use No  . Drug use: No  . Sexual activity: Yes    Birth control/ protection: Post-menopausal   Other Topics Concern  . Not on file   Social History Narrative  . No narrative on file    Review of Systems: As mentioned in HPI   Physical Exam: BP 132/62   Pulse 77   Temp (!) 96.4 F (35.8 C) (Oral)   Ht 5\' 2"  (1.575 m)   Wt 175 lb 3.2 oz (79.5 kg)   BMI 32.04 kg/m  General:   Alert and oriented. Pleasant but appears uncomfortable due to nausea.  Head:  Normocephalic and atraumatic. Eyes:  Without icterus, sclera clear and conjunctiva pink.  Ears:  Normal auditory acuity. Nose:  No deformity, discharge,  or lesions. Mouth:  No deformity or lesions, oral mucosa pink.  Lungs:  Clear to auscultation bilaterally. No wheezes, rales, or rhonchi. No distress.  Heart:  S1, S2 present without murmurs appreciated.  Abdomen:  +BS, soft, epigastric and RUQ moderately TTP, no rebound or guarding,  No HSM noted. Rectal:  Deferred  Msk:  Symmetrical without gross deformities. Normal posture. Extremities:  Without edema. Neurologic:  Alert and  oriented x4 Psych:  Alert and cooperative. Normal mood and affect.  Lab Results  Component Value Date   WBC 9.8 10/20/2016   HGB 8.6 (L) 10/20/2016   HCT 28.9 (L) 10/20/2016   MCV 69.3 (L) 10/20/2016   PLT 462 (H) 10/20/2016    Lab Results  Component Value Date   ALT 17 10/20/2016   AST 27 10/20/2016   ALKPHOS 32 (L) 10/20/2016   BILITOT 0.5 10/20/2016   Lab Results  Component Value Date   CREATININE 1.43 (H) 10/20/2016   BUN 28 (H)  10/20/2016   NA 136 10/20/2016   K 4.0 10/20/2016   CL 101 10/20/2016   CO2 23 10/20/2016   Lab Results  Component Value Date   LIPASE 33 10/20/2016

## 2016-10-31 NOTE — Patient Instructions (Signed)
I have ordered a special scan of your gallbladder.   I have sent in a medication for nausea called Phenergan. THIS CAN BE SEDATING, make you drowsy, dry mouth. Do not take while driving. Take every 6-8 hours as needed. You may also take the Zofran, but this does not seem to be helping as much.  I want you to go to the emergency room if you have ANY changes in your status such as fever, worsening nausea, vomiting, worsening pain, dehydration.   I am also referring you to a surgeon to see about your gallbladder.  We will need to see you once this acute illness resolves to follow-up on further testing regarding your blood counts and history of iron deficiency.

## 2016-10-31 NOTE — Assessment & Plan Note (Signed)
61 year old female with new onset RUQ pain and nausea for approximately a month, with predominant symptom of nausea currently. Zofran without much improvement. CT with cholelithiasis. LFTs and lipase normal. Highly suspect biliary etiology. No acute findings on exam today to warrant inpatient hospitalization. Afebrile. I am ordering a HIDA with ensure and referring to general surgery as soon as possible. Differentials including gastritis/PUD, but I feel this is less likely considering her clinical presentation.   Continue to stay hydrated, follow bland diet for now, Zofran prn. I have added 12.5 mg Phenergan as needed also, as Zofran does not seem to be helping. Discussed side effects to monitor with phenergan. I have asked her to present to the ED if worsening N/V, worsening pain, any evidence of fever.  As separate issue: she has a history of IDA, previously established with [facility]. TCS/EGD in 2016 unrevealing. Unclear if she has ever had a capsule study. No overt GI bleeding noted, but her Hgb is 8.6 today. Unknown iron studies. Unclear her baseline, but she was 9.2 a year ago, so this is not hugely different from prior. She will need further investigation and support for this. No indication for blood transfusion, and I doubt she would tolerate oral iron now. Will have her return for close follow-up to discuss further diagnostic evaluation (?capsule study) vs repeat EGD if cholecystitis is not felt to be present. Would benefit from IV iron as well in near future.

## 2016-11-02 NOTE — Progress Notes (Signed)
REVIEWED. Persistent anemia since 2010. EGD/DIL-NO Bx(2010/2016), TCS x 2(2010/2016). JUL 2018: Hb 8.6 MCV 69.3. NEEDS VITAMIN B12, RBC FOLATE, AND FERRITIN. IF FEDA PT NEED EGD WITH GASTRIC AND DUODENAL BIOPSIES AND IF NO SOURCE FOR MICROCYTIC ANEMIA IDENTIFIED THEN NEEDS GIVENS CAPSULE.

## 2016-11-03 ENCOUNTER — Encounter (HOSPITAL_COMMUNITY)
Admission: RE | Admit: 2016-11-03 | Discharge: 2016-11-03 | Disposition: A | Discharge status: Home / Self Care | Payer: Medicare Other | Source: Ambulatory Visit | Attending: Gastroenterology | Admitting: Gastroenterology

## 2016-11-03 ENCOUNTER — Encounter (HOSPITAL_COMMUNITY): Payer: Self-pay

## 2016-11-03 DIAGNOSIS — R1011 Right upper quadrant pain: Secondary | ICD-10-CM | POA: Diagnosis present

## 2016-11-03 DIAGNOSIS — K8020 Calculus of gallbladder without cholecystitis without obstruction: Secondary | ICD-10-CM | POA: Insufficient documentation

## 2016-11-03 MED ORDER — TECHNETIUM TC 99M MEBROFENIN IV KIT
5.0000 | PACK | Freq: Once | INTRAVENOUS | Status: AC | PRN
  Administered 2016-11-03: 5.5 via INTRAVENOUS

## 2016-11-03 NOTE — Progress Notes (Signed)
cc'ed to pcp °

## 2016-11-04 NOTE — Progress Notes (Signed)
HIDA scan with normal EF of 57%. She did have nausea after ensure. Please see office note addendum. She needs additional blood work. Keep appt with Gen Surg. May need EGD in near future. How is she doing?

## 2016-11-04 NOTE — Progress Notes (Signed)
Per Dr. Darrick PennaFields' review and recommendations:  Need to order ferritin, RBC folate, Vit B 12 labs. Further recommendations regarding need for EGD with gastric/duodenal biopsies after review of labs. Please have her do as soon as possible.

## 2016-11-05 ENCOUNTER — Other Ambulatory Visit: Payer: Self-pay

## 2016-11-05 ENCOUNTER — Telehealth: Payer: Self-pay

## 2016-11-05 DIAGNOSIS — R7989 Other specified abnormal findings of blood chemistry: Secondary | ICD-10-CM

## 2016-11-05 NOTE — Progress Notes (Signed)
Pt is aware. She will go to the lab today. She is still staying very nauseated. Has a few phenergan tablets left. She had not heard from surgeon, but I gave her date and time in computer as 11/18/2016 at 2:00 pm with Dr. Lovell SheehanJenkins and she is aware where his office is. I told her she should be hearing from them.

## 2016-11-05 NOTE — Progress Notes (Signed)
Pt is aware. Still staying nauseated. Aware of appt for surgeon in computer. Will go today for labs.

## 2016-11-05 NOTE — Telephone Encounter (Signed)
See result note. Already discussed with pt.

## 2016-11-05 NOTE — Progress Notes (Signed)
LMOM for a return call. Lab orders have been entered and released.  

## 2016-11-05 NOTE — Telephone Encounter (Signed)
Pt was returning DS call from this morning.  I told patient that DS was on another call and transferred to VM.

## 2016-11-06 LAB — FOLATE RBC: RBC Folate: 1267 ng/mL (ref >280)

## 2016-11-06 LAB — VITAMIN B12: Vitamin B-12: 1145 pg/mL — ABNORMAL HIGH (ref 200–1100)

## 2016-11-10 ENCOUNTER — Ambulatory Visit: Payer: Medicare Other | Admitting: Gastroenterology

## 2016-11-14 ENCOUNTER — Emergency Department (HOSPITAL_COMMUNITY): Payer: Medicare Other

## 2016-11-14 ENCOUNTER — Emergency Department (HOSPITAL_COMMUNITY)
Admission: EM | Admit: 2016-11-14 | Discharge: 2016-11-14 | Disposition: A | Discharge status: Home / Self Care | Payer: Medicare Other | Attending: Emergency Medicine | Admitting: Emergency Medicine

## 2016-11-14 DIAGNOSIS — R109 Unspecified abdominal pain: Secondary | ICD-10-CM | POA: Insufficient documentation

## 2016-11-14 DIAGNOSIS — R197 Diarrhea, unspecified: Secondary | ICD-10-CM | POA: Diagnosis not present

## 2016-11-14 DIAGNOSIS — R112 Nausea with vomiting, unspecified: Secondary | ICD-10-CM

## 2016-11-14 DIAGNOSIS — Z79899 Other long term (current) drug therapy: Secondary | ICD-10-CM | POA: Insufficient documentation

## 2016-11-14 DIAGNOSIS — F17210 Nicotine dependence, cigarettes, uncomplicated: Secondary | ICD-10-CM | POA: Insufficient documentation

## 2016-11-14 DIAGNOSIS — Z794 Long term (current) use of insulin: Secondary | ICD-10-CM | POA: Insufficient documentation

## 2016-11-14 DIAGNOSIS — E119 Type 2 diabetes mellitus without complications: Secondary | ICD-10-CM | POA: Diagnosis not present

## 2016-11-14 LAB — COMPREHENSIVE METABOLIC PANEL
ALT: 20 U/L (ref 14–54)
ANION GAP: 11 (ref 5–15)
AST: 26 U/L (ref 15–41)
Albumin: 4 g/dL (ref 3.5–5.0)
Alkaline Phosphatase: 31 U/L — ABNORMAL LOW (ref 38–126)
BUN: 21 mg/dL — ABNORMAL HIGH (ref 6–20)
CHLORIDE: 109 mmol/L (ref 101–111)
CO2: 20 mmol/L — AB (ref 22–32)
CREATININE: 1.18 mg/dL — AB (ref 0.44–1.00)
Calcium: 9.9 mg/dL (ref 8.9–10.3)
GFR, EST AFRICAN AMERICAN: 56 mL/min — AB (ref >60)
GFR, EST NON AFRICAN AMERICAN: 49 mL/min — AB (ref >60)
Glucose, Bld: 111 mg/dL — ABNORMAL HIGH (ref 65–99)
POTASSIUM: 4.7 mmol/L (ref 3.5–5.1)
Sodium: 140 mmol/L (ref 135–145)
Total Bilirubin: 0.4 mg/dL (ref 0.3–1.2)
Total Protein: 7.8 g/dL (ref 6.5–8.1)

## 2016-11-14 LAB — CBC
HEMATOCRIT: 29 % — AB (ref 36.0–46.0)
HEMOGLOBIN: 8.8 g/dL — AB (ref 12.0–15.0)
MCH: 21.3 pg — ABNORMAL LOW (ref 26.0–34.0)
MCHC: 30.3 g/dL (ref 30.0–36.0)
MCV: 70 fL — AB (ref 78.0–100.0)
PLATELETS: 431 10*3/uL — AB (ref 150–400)
RBC: 4.14 MIL/uL (ref 3.87–5.11)
RDW: 20.4 % — ABNORMAL HIGH (ref 11.5–15.5)
WBC: 9.2 10*3/uL (ref 4.0–10.5)

## 2016-11-14 LAB — DIFFERENTIAL
Basophils Absolute: 0.1 10*3/uL (ref 0.0–0.1)
Basophils Relative: 1 %
EOS ABS: 0.2 10*3/uL (ref 0.0–0.7)
EOS PCT: 2 %
LYMPHS PCT: 20 %
Lymphs Abs: 1.9 10*3/uL (ref 0.7–4.0)
MONO ABS: 0.5 10*3/uL (ref 0.1–1.0)
Monocytes Relative: 5 %
Neutro Abs: 6.9 10*3/uL (ref 1.7–7.7)
Neutrophils Relative %: 72 %

## 2016-11-14 LAB — LACTIC ACID, PLASMA
LACTIC ACID, VENOUS: 0.8 mmol/L (ref 0.5–1.9)
LACTIC ACID, VENOUS: 0.6 mmol/L (ref 0.5–1.9)

## 2016-11-14 LAB — URINALYSIS, ROUTINE W REFLEX MICROSCOPIC
BILIRUBIN URINE: NEGATIVE
GLUCOSE, UA: NEGATIVE mg/dL
Hgb urine dipstick: NEGATIVE
KETONES UR: NEGATIVE mg/dL
Leukocytes, UA: NEGATIVE
Nitrite: NEGATIVE
PROTEIN: NEGATIVE mg/dL
SPECIFIC GRAVITY, URINE: 1.018 (ref 1.005–1.030)
pH: 5 (ref 5.0–8.0)

## 2016-11-14 LAB — LIPASE, BLOOD: LIPASE: 37 U/L (ref 11–51)

## 2016-11-14 MED ORDER — MORPHINE SULFATE (PF) 4 MG/ML IV SOLN
4.0000 mg | INTRAVENOUS | Status: DC | PRN
  Administered 2016-11-14: 4 mg via INTRAVENOUS
  Filled 2016-11-14: qty 1

## 2016-11-14 MED ORDER — PROMETHAZINE HCL 25 MG RE SUPP
25.0000 mg | Freq: Four times a day (QID) | RECTAL | 0 refills | Status: DC | PRN
Start: 2016-11-14 — End: 2018-05-24

## 2016-11-14 MED ORDER — IOPAMIDOL (ISOVUE-300) INJECTION 61%
100.0000 mL | Freq: Once | INTRAVENOUS | Status: AC | PRN
  Administered 2016-11-14: 100 mL via INTRAVENOUS

## 2016-11-14 MED ORDER — HYDROCODONE-ACETAMINOPHEN 5-325 MG PO TABS: ORAL_TABLET | ORAL | 0 refills | Status: DC

## 2016-11-14 MED ORDER — PROMETHAZINE HCL 25 MG PO TABS: 25.0000 mg | ORAL_TABLET | Freq: Four times a day (QID) | ORAL | 0 refills | Status: DC | PRN

## 2016-11-14 MED ORDER — SODIUM CHLORIDE 0.9 % IV BOLUS (SEPSIS)
500.0000 mL | Freq: Once | INTRAVENOUS | Status: AC
  Administered 2016-11-14: 500 mL via INTRAVENOUS

## 2016-11-14 MED ORDER — ONDANSETRON HCL 4 MG/2ML IJ SOLN
4.0000 mg | INTRAMUSCULAR | Status: DC | PRN
  Administered 2016-11-14: 4 mg via INTRAVENOUS
  Filled 2016-11-14: qty 2

## 2016-11-14 MED ORDER — SODIUM CHLORIDE 0.9 % IV SOLN
INTRAVENOUS | Status: DC
  Administered 2016-11-14: 18:00:00 via INTRAVENOUS

## 2016-11-14 NOTE — ED Provider Notes (Signed)
[facility] Provider Note   CSN: [id number] Arrival date & time: 11/14/16  1432     History   Chief Complaint Chief Complaint  Patient presents with  . Abdominal Pain    HPI Linda Riggs is a 61 y.o. female.  HPI  Pt was seen at 1525.  Per pt, c/o gradual onset and worsening on persistent right abd "pain" for the past 1+ month.  Has been associated with multiple intermittent episodes of N/V/D and one fever to "101" last month.  Describes the abd pain as "cramping."  Denies back pain, no rash, no CP/SOB, no black or blood in stools or emesis. Pt has been evaluated by the ED, her PMD and GI MD for this complaint and states she has a f/u appt in 4 days with General Surgeon to discuss "having my gallbladder taken out."    Past Medical History:  Diagnosis Date  . Arthritis   . Chronic headaches   . Depression   . Diverticulosis   . Fibromyalgia   . GERD (gastroesophageal reflux disease)   . Goiter   . Internal hemorrhoids   . Iron deficiency anemia    Dr. Arlyce Dice 11/10  . Osteoporosis   . Ruptured lumbar disc   . Type 2 diabetes mellitus [facility])     Patient Active Problem List   Diagnosis Date Noted  . RUQ pain 10/31/2016  . 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 Hyperlipidemia 03/07/2015  . Essential hypertension, benign 03/07/2015  . History of colonic polyps 09/20/2014  . Postoperative wound hematoma 06/08/2013  . Atypical chest pain 09/06/2010  . Tobacco abuse 09/06/2010  . Type 2 diabetes mellitus, uncontrolled (HCC) 01/31/2009  . Iron deficiency anemia 01/31/2009  . ESOPHAGEAL REFLUX 01/31/2009  . Dysphagia 01/31/2009    Past Surgical History:  Procedure Laterality Date  . Arthroscopic left knee surgery Bilateral   . BACK SURGERY    . COLONOSCOPY  2016   Dr. Arlyce Dice: diverticulosis  . DILATION AND CURETTAGE OF UTERUS    . ESOPHAGOGASTRODUODENOSCOPY  2016   Dr. Arlyce Dice: early GE junction stricture s/p dilation   . THYROIDECTOMY N/A 06/06/2013   Procedure: TOTAL THYROIDECTOMY;  Surgeon: Dalia Heading, MD;  Location: [facility] OR[facility];  Service: General;  Laterality: N/A;    OB History    No data available       Home Medications    Prior to Admission medications   Medication Sig Start Date End Date Taking? Authorizing Provider  atorvastatin (LIPITOR) 80 MG tablet Take 80 mg by mouth daily.    [provider]  escitalopram (LEXAPRO) 20 MG tablet Take 20 mg by mouth daily.      [provider]  esomeprazole (NEXIUM) 40 MG capsule Take 40 mg by mouth daily.  01/25/16   [provider]  fenofibrate 160 MG tablet Take 160 mg by mouth daily.  01/25/16   [provider]  fexofenadine (ALLEGRA) 180 MG tablet Take 180 mg by mouth daily.      [provider]  gabapentin (NEURONTIN) 300 MG capsule Take 300 mg by mouth 3 (three) times daily.    [provider]  hydrochlorothiazide (HYDRODIURIL) 25 MG tablet Take 25 mg by mouth daily.    [provider]  Insulin Detemir (LEVEMIR FLEXTOUCH) 100 UNIT/ML Pen INJECT 55 UNITS SQ AT 10PM 10/07/16   Roma Kayser, MD  INVOKANA 100 MG TABS tablet TAKE ONE (1) TABLET EACH DAY 10/29/16   Roma Kayser, MD  levothyroxine (  SYNTHROID, LEVOTHROID) 125 MCG tablet TAKE ONE TABLET EVERY MORNING 10/29/16   Nida, Denman George, MD  meloxicam (MOBIC) 7.5 MG tablet Take 7.5 mg by mouth 2 (two) times daily.     [provider]  metFORMIN (GLUCOPHAGE-XR) 500 MG 24 hr tablet TAKE ONE TABLET BY MOUTH TWICE DAILY 10/29/16   Roma Kayser, MD  ondansetron (ZOFRAN ODT) 4 MG disintegrating tablet 4mg  ODT q4 hours prn nausea/vomit 10/20/16   Bethann Berkshire, MD  [facility] DELICA LANCETS 33G MISC 4 TIMES DAILY 07/30/16   Roma Kayser, MD  ONETOUCH VERIO test strip CHECK BLOOD 4 TIMES DAILY 07/16/16   Roma Kayser, MD  promethazine (PHENERGAN) 12.5 MG tablet Take 1 tablet (12.5 mg total) by mouth every 6 (six) hours as needed for  nausea or vomiting. 10/31/16   Gelene Mink, NP  quinapril (ACCUPRIL) 10 MG tablet Take 10 mg by mouth daily.  01/25/16   [provider]  traZODone (DESYREL) 100 MG tablet Take 100 mg by mouth at bedtime.     [provider]  Stann Ore SHORT PEN NEEDLES 31G X 8 MM MISC 4 TIMES DAILY 07/30/16   Roma Kayser, MD    Family History Family History  Problem Relation Age of Onset  . Diabetes type II Mother   . Alzheimer's disease Mother   . Diabetes type II Sister   . Diabetes Sister   . Diabetes type II Father   . Cancer Father        Lung  . Diabetes Brother   . Diabetes Sister   . Colon cancer Neg Hx     Social History Social History  Substance Use Topics  . Smoking status: Current Every Day Smoker    Packs/day: 0.50    Years: 20.00    Types: Cigarettes    Last attempt to quit: 04/27/2015  . Smokeless tobacco: Never Used  . Alcohol use No     Allergies   Aspirin; Flonase [fluticasone propionate]; Prednisone; and Tramadol   Review of Systems Review of Systems ROS: Statement: All systems negative except as marked or noted in the HPI; Constitutional: +one episode fever and chills last month. ; ; Eyes: Negative for eye pain, redness and discharge. ; ; ENMT: Negative for ear pain, hoarseness, nasal congestion, sinus pressure and sore throat. ; ; Cardiovascular: Negative for chest pain, palpitations, diaphoresis, dyspnea and peripheral edema. ; ; Respiratory: Negative for cough, wheezing and stridor. ; ; Gastrointestinal: +N/V/D, abd pain. Negative for blood in stool, hematemesis, jaundice and rectal bleeding. . ; ; Genitourinary: Negative for dysuria, flank pain and hematuria. ; ; Musculoskeletal: Negative for back pain and neck pain. Negative for swelling and trauma.; ; Skin: Negative for pruritus, rash, abrasions, blisters, bruising and skin lesion.; ; Neuro: Negative for headache, lightheadedness and neck stiffness. Negative for weakness, altered level of  consciousness, altered mental status, extremity weakness, paresthesias, involuntary movement, seizure and syncope.      Physical Exam Updated Vital Signs BP (!) 149/70 (BP Location: Right Arm)   Pulse 75   Temp 98.4 F (36.9 C) (Oral)   Resp 18   Ht 5\' 2"  (1.575 m)   Wt 79.4 kg (175 lb)   SpO2 100%   BMI 32.01 kg/m   Physical Exam 1530: Physical examination:  Nursing notes reviewed; Vital signs and O2 SAT reviewed;  Constitutional: Well developed, Well nourished, Well hydrated, In no acute distress; Head:  Normocephalic, atraumatic; Eyes: EOMI, PERRL, No scleral icterus; ENMT:  Mouth and pharynx normal, Mucous membranes moist; Neck: Supple, Full range of motion, No lymphadenopathy; Cardiovascular: Regular rate and rhythm, No gallop; Respiratory: Breath sounds clear & equal bilaterally, No wheezes.  Speaking full sentences with ease, Normal respiratory effort/excursion; Chest: Nontender, Movement normal; Abdomen: Soft, +RUQ and RLQ tenderness to palp. No rebound or guarding. Nondistended, Normal bowel sounds; Genitourinary: No CVA tenderness; Extremities: Pulses normal, No tenderness, No edema, No calf edema or asymmetry.; Neuro: AA&Ox3, Major CN grossly intact.  Speech clear. No gross focal motor or sensory deficits in extremities.; Skin: Color pale, Warm, Dry.   ED Treatments / Results  Labs (all labs ordered are listed, but only abnormal results are displayed)   EKG  EKG Interpretation None       Radiology   Procedures Procedures (including critical care time)  Medications Ordered in ED Medications  sodium chloride 0.9 % bolus 500 mL (not administered)  0.9 %  sodium chloride infusion (not administered)  ondansetron (ZOFRAN) injection 4 mg (not administered)  morphine 4 MG/ML injection 4 mg (not administered)     Initial Impression / Assessment and Plan / ED Course  I have reviewed the triage vital signs and the nursing notes.  Pertinent labs & imaging results  that were available during my care of the patient were reviewed by me and considered in my medical decision making (see chart for details).  MDM Reviewed: previous chart, nursing note and vitals Reviewed previous: labs, CT scan and ultrasound Interpretation: labs, CT scan and ultrasound   Nm Hepato W/eject Fract Result Date: 11/03/2016 CLINICAL DATA:  Abdominal pain and nausea for 1 month. EXAM: NUCLEAR MEDICINE HEPATOBILIARY IMAGING WITH GALLBLADDER EF TECHNIQUE: Sequential images of the abdomen were obtained out to 60 minutes following intravenous administration of radiopharmaceutical. After oral ingestion of Ensure, gallbladder ejection fraction was determined. At 60 min, normal ejection fraction is greater than 33%. RADIOPHARMACEUTICALS:  5.5 mCi Tc-70m  Choletec IV COMPARISON:  CT abdomen 10/20/2016 FINDINGS: Satisfactory uptake of radiopharmaceutical from the blood pool. Biliary activity at 8 minutes. Gallbladder activity at 11 minutes. Bowel activity was not visualized until after the Ensure meal. Patient complained of nausea after the Ensure meal. Calculated gallbladder ejection fraction is 57%. (Normal gallbladder ejection fraction with Ensure is greater than 33%.) IMPRESSION: 1. The patient complained of nausea after the Ensure meal. Otherwise normal exam with gallbladder ejection fraction of 57%. Electronically Signed   By: Gaylyn Rong M.D.   On: 11/03/2016 11:19    Results for orders placed or performed during the hospital encounter of 11/14/16  Lipase, blood  Result Value Ref Range   Lipase 37 11 - 51 U/L  Comprehensive metabolic panel  Result Value Ref Range   Sodium 140 135 - 145 mmol/L   Potassium 4.7 3.5 - 5.1 mmol/L   Chloride 109 101 - 111 mmol/L   CO2 20 (L) 22 - 32 mmol/L   Glucose, Bld 111 (H) 65 - 99 mg/dL   BUN 21 (H) 6 - 20 mg/dL   Creatinine, Ser 4.09 (H) 0.44 - 1.00 mg/dL   Calcium 9.9 8.9 - 81.1 mg/dL   Total Protein 7.8 6.5 - 8.1 g/dL   Albumin 4.0 3.5 -  5.0 g/dL   AST 26 15 - 41 U/L   ALT 20 14 - 54 U/L   Alkaline Phosphatase 31 (L) 38 - 126 U/L   Total Bilirubin 0.4 0.3 - 1.2 mg/dL   GFR calc non Af Amer 49 (L) >60 mL/min  GFR calc Af Amer 56 (L) >60 mL/min   Anion gap 11 5 - 15  CBC  Result Value Ref Range   WBC 9.2 4.0 - 10.5 K/uL   RBC 4.14 3.87 - 5.11 MIL/uL   Hemoglobin 8.8 (L) 12.0 - 15.0 g/dL   HCT 40.9 (L) 81.1 - 91.4 %   MCV 70.0 (L) 78.0 - 100.0 fL   MCH 21.3 (L) 26.0 - 34.0 pg   MCHC 30.3 30.0 - 36.0 g/dL   RDW 78.2 (H) 95.6 - 21.3 %   Platelets 431 (H) 150 - 400 K/uL  Urinalysis, Routine w reflex microscopic  Result Value Ref Range   Color, Urine YELLOW YELLOW   APPearance CLEAR CLEAR   Specific Gravity, Urine 1.018 1.005 - 1.030   pH 5.0 5.0 - 8.0   Glucose, UA NEGATIVE NEGATIVE mg/dL   Hgb urine dipstick NEGATIVE NEGATIVE   Bilirubin Urine NEGATIVE NEGATIVE   Ketones, ur NEGATIVE NEGATIVE mg/dL   Protein, ur NEGATIVE NEGATIVE mg/dL   Nitrite NEGATIVE NEGATIVE   Leukocytes, UA NEGATIVE NEGATIVE  Differential  Result Value Ref Range   Neutrophils Relative % 72 %   Neutro Abs 6.9 1.7 - 7.7 K/uL   Lymphocytes Relative 20 %   Lymphs Abs 1.9 0.7 - 4.0 K/uL   Monocytes Relative 5 %   Monocytes Absolute 0.5 0.1 - 1.0 K/uL   Eosinophils Relative 2 %   Eosinophils Absolute 0.2 0.0 - 0.7 K/uL   Basophils Relative 1 %   Basophils Absolute 0.1 0.0 - 0.1 K/uL  Lactic acid, plasma  Result Value Ref Range   Lactic Acid, Venous 0.8 0.5 - 1.9 mmol/L  Lactic acid, plasma  Result Value Ref Range   Lactic Acid, Venous 0.6 0.5 - 1.9 mmol/L   Ct Abdomen Pelvis W Contrast Result Date: 11/14/2016 CLINICAL DATA:  Bowel pain, vomiting and diarrhea. EXAM: CT ABDOMEN AND PELVIS WITH CONTRAST TECHNIQUE: Multidetector CT imaging of the abdomen and pelvis was performed using the standard protocol following bolus administration of intravenous contrast. CONTRAST:  ISOVUE-300 IOPAMIDOL (ISOVUE-300) INJECTION 61% COMPARISON:   10/20/2016 CT abdomen/pelvis. 11/14/2016 abdominal sonogram. FINDINGS: Lower chest: No significant pulmonary nodules or acute consolidative airspace disease. Right coronary atherosclerosis. Hepatobiliary: Normal liver with no liver mass. Cholelithiasis. No gallbladder wall thickening, significant gallbladder distention or pericholecystic fluid. No biliary ductal dilatation. Common bile duct diameter 2 mm. Pancreas: Normal, with no mass or duct dilation. Spleen: Normal size. No mass. Adrenals/Urinary Tract: Normal adrenals. Simple right renal cysts, largest 5.3 cm in the medial upper right kidney. No hydronephrosis. No left renal masses. Collapsed and grossly normal bladder . Stomach/Bowel: Grossly normal stomach. Normal caliber small bowel with no small bowel wall thickening. Normal appendix. Mild sigmoid diverticulosis, with no large bowel wall thickening or pericolonic fat stranding. Vascular/Lymphatic: Atherosclerotic nonaneurysmal abdominal aorta. Patent portal, splenic, hepatic and renal veins. No pathologically enlarged lymph nodes in the abdomen or pelvis. Reproductive: Grossly normal uterus.  No adnexal mass. Other: No pneumoperitoneum, ascites or focal fluid collection. Musculoskeletal: No aggressive appearing focal osseous lesions. Marked thoracolumbar spondylosis. IMPRESSION: 1. No acute abnormality. No evidence of bowel obstruction or acute bowel inflammation. Mild sigmoid diverticulosis, with no evidence of acute diverticulitis. 2. Cholelithiasis, with no CT evidence of acute cholecystitis. No biliary ductal dilatation. 3.  Aortic Atherosclerosis (ICD10-I70.0).  Coronary atherosclerosis. Electronically Signed   By: Delbert Phenix M.D.   On: 11/14/2016 18:04   US Abdomen Limited Result Date: 11/14/2016 CLINICAL DATA:  Right  upper quadrant pain and nausea vomiting. EXAM: ULTRASOUND ABDOMEN LIMITED RIGHT UPPER QUADRANT COMPARISON:  CT scan from 10/20/2016 FINDINGS: Gallbladder: Tiny gallstones evident,  measuring up to 7 mm. Gallbladder wall measures up to 4 mm and thickness. No substantial pericholecystic fluid. The sonographer reports no sonographic Murphy sign. Common bile duct: Diameter: 3 mm Liver: Poor acoustic through transmission raises the question of steatosis. Other:  4.6 cm cyst identified upper pole right kidney. IMPRESSION: 1. Cholelithiasis with mild gallbladder wall thickening. No sonographic Murphy sign. Acute cholecystitis not excluded by imaging. If clinical picture is equivocal, nuclear scintigraphy may prove helpful to further evaluate. Of note, the patient did have a normal nuclear study with normal gallbladder ejection on 11/03/2016. 2. Right upper pole renal cyst as seen on recent CT scan. Electronically Signed   By: Kennith Center M.D.   On: 11/14/2016 16:57    Results for Linda, Riggs (MRN [id number]) as of 11/14/2016 16:33  Ref. Range 06/09/2013 05:13 06/10/2013 05:05 06/11/2013 06:21 09/20/2014 11:17 10/20/2016 10:22 11/14/2016 14:50  Hemoglobin Latest Ref Range: 12.0 - 15.0 g/dL 8.3 (L) 8.2 (L) 8.2 (L) 10.1 (L) 8.6 (L) 8.8 (L)  HCT Latest Ref Range: 36.0 - 46.0 % 24.6 (L) 24.8 (L) 24.7 (L) 31.3 (L) 28.9 (L) 29.0 (L)    1855:  Pt has tol PO well while in the ED without N/V.  No stooling while in the ED.  VSS.  H/H per baseline. No clear indication for admission at this time. Pt states she is ready to go home now. Tx symptomatically at this time, f/u General Surgeon as previously scheduled. Dx and testing d/w pt and family.  Questions answered.  Verb understanding, agreeable to d/c home with outpt f/u.     Final Clinical Impressions(s) / ED Diagnoses   Final diagnoses:  None    New Prescriptions New Prescriptions   No medications on file     Samuel Jester, DO 11/16/16 1402

## 2016-11-14 NOTE — ED Notes (Signed)
Pt alert & oriented x4, stable gait. Patient given discharge instructions, paperwork & prescription(s). Patient informed not to drive, operate any equipment & handel any important documents 4 hours after taking pain medication. Patient  instructed to stop at the registration desk to finish any additional paperwork. Patient  verbalized understanding. Pt left department w/ no further questions. 

## 2016-11-14 NOTE — Discharge Instructions (Signed)
Eat a bland diet, avoiding greasy, fatty, fried foods, as well as spicy and acidic foods or beverages.  Avoid eating within the hour or 2 before going to bed or laying down.  Also avoid teas, colas, coffee, chocolate, pepermint and spearment.  Take over the counter pepcid, one tablet by mouth twice a day, for the next 2 to 3 weeks.  May also take over the counter maalox/mylanta, as directed on packaging, as needed for discomfort.  Take the prescriptions as directed.  Call your regular medical doctor and the General Surgeon on Monday to schedule a follow up appointment next week.  Return to the Emergency Department immediately if worsening.

## 2016-11-14 NOTE — ED Triage Notes (Addendum)
Pt reports history of same and reports has an appointment with Dr. Lovell Sheehan on Tuesday. Pt reports continued nausea, vomiting, abd pain. Pt reports " I cant ever get rid of this, Im always sick."   Per chart, pt diagnosed with gallstones x2 weeks ago.

## 2016-11-14 NOTE — ED Notes (Signed)
Pt complained of itching to right arm after morphine adm approx 5 mins afterwards

## 2016-11-14 NOTE — ED Notes (Signed)
Transported to CT 

## 2016-11-18 ENCOUNTER — Encounter: Payer: Self-pay | Admitting: General Surgery

## 2016-11-18 ENCOUNTER — Ambulatory Visit: Payer: Medicare Other | Admitting: General Surgery

## 2016-11-18 ENCOUNTER — Ambulatory Visit (INDEPENDENT_AMBULATORY_CARE_PROVIDER_SITE_OTHER): Payer: Medicare Other | Admitting: General Surgery

## 2016-11-18 VITALS — BP 156/60 | HR 105 | Temp 98.7°F | Resp 18 | Ht 63.0 in | Wt 161.0 lb

## 2016-11-18 DIAGNOSIS — K8020 Calculus of gallbladder without cholecystitis without obstruction: Secondary | ICD-10-CM

## 2016-11-18 NOTE — H&P (Signed)
Linda Riggs; [id number]; 03/18/1956   HPI Patient is a 61-year-old white female who is referred to my care by Anna Boone, NP of gastroenterology for evaluation treatment of right upper quadrant abdominal pain. Patient states she has had right upper quadrant abdominal pain radiating down the right side of her abdomen and to her back, nausea, vomiting, and fatty food intolerance for 2 months. Her symptoms are becoming more daily in nature. She has been to the emergency room for this problem. She denies any fever, chills, or jaundice. She rates her pain is 10 out of 10. Her appetite is significantly decreased. Past Medical History:  Diagnosis Date  . Arthritis   . Chronic headaches   . Depression   . Diverticulosis   . Fibromyalgia   . GERD (gastroesophageal reflux disease)   . Goiter   . Internal hemorrhoids   . Iron deficiency anemia    Dr. Kaplan 11/10  . Osteoporosis   . Ruptured lumbar disc   . Type 2 diabetes mellitus (HCC)     Past Surgical History:  Procedure Laterality Date  . Arthroscopic left knee surgery Bilateral   . BACK SURGERY    . COLONOSCOPY  2016   Dr. Kaplan: diverticulosis  . DILATION AND CURETTAGE OF UTERUS    . ESOPHAGOGASTRODUODENOSCOPY  2016   Dr. Kaplan: early GE junction stricture s/p dilation   . THYROIDECTOMY N/A 06/06/2013   Procedure: TOTAL THYROIDECTOMY;  Surgeon: Dorothymae Maciver A Jarrah Babich, MD;  Location: [facility];  Service: General;  Laterality: N/A;    Family History  Problem Relation Age of Onset  . Diabetes type II Mother   . Alzheimer's disease Mother   . Diabetes type II Sister   . Diabetes Sister   . Diabetes type II Father   . Cancer Father        Lung  . Diabetes Brother   . Diabetes Sister   . Colon cancer Neg Hx     Current Outpatient Prescriptions on File Prior to Visit  Medication Sig Dispense Refill  . alendronate (FOSAMAX) 70 MG tablet Take 10 mg by mouth once a week.    . Aspirin-Acetaminophen-Caffeine (EXCEDRIN PO) Take 1-2 tablets  by mouth every 4 (four) hours as needed (pain).    . atorvastatin (LIPITOR) 80 MG tablet Take 80 mg by mouth daily.    . Calcium Carbonate-Vit D-Min (CALCIUM 1200 PO) Take 1 capsule by mouth daily.    . escitalopram (LEXAPRO) 20 MG tablet Take 20 mg by mouth daily.      . esomeprazole (NEXIUM) 40 MG capsule Take 40 mg by mouth daily.     . fenofibrate 160 MG tablet Take 160 mg by mouth daily.     . fexofenadine (ALLEGRA) 180 MG tablet Take 180 mg by mouth daily.      . gabapentin (NEURONTIN) 300 MG capsule Take 300 mg by mouth 3 (three) times daily.    . hydrochlorothiazide (HYDRODIURIL) 25 MG tablet Take 25 mg by mouth daily.    . HYDROcodone-acetaminophen (NORCO/VICODIN) 5-325 MG tablet 1 or 2 tabs PO q6 hours prn pain 15 tablet 0  . Insulin Detemir (LEVEMIR FLEXTOUCH) 100 UNIT/ML Pen INJECT 55 UNITS SQ AT 10PM (Patient taking differently: 60 Units. INJECT 55 UNITS SQ AT 10PM) 45 mL 0  . INVOKANA 100 MG TABS tablet TAKE ONE (1) TABLET EACH DAY 90 tablet 0  . levothyroxine (SYNTHROID, LEVOTHROID) 125 MCG tablet TAKE ONE TABLET EVERY MORNING 90 tablet 0  . meloxicam (MOBIC)   7.5 MG tablet Take 7.5 mg by mouth 2 (two) times daily.     . metFORMIN (GLUCOPHAGE-XR) 500 MG 24 hr tablet TAKE ONE TABLET BY MOUTH TWICE DAILY 180 tablet 0  . ondansetron (ZOFRAN ODT) 4 MG disintegrating tablet 4mg ODT q4 hours prn nausea/vomit 12 tablet 0  . ondansetron (ZOFRAN) 4 MG tablet Take 4 mg by mouth daily as needed for nausea or vomiting.    . ONETOUCH DELICA LANCETS 33G MISC 4 TIMES DAILY 100 each 0  . ONETOUCH VERIO test strip CHECK BLOOD 4 TIMES DAILY 150 each 5  . POTASSIUM PO Take 1 tablet by mouth daily.    . promethazine (PHENERGAN) 25 MG suppository Place 1 suppository (25 mg total) rectally every 6 (six) hours as needed for nausea or vomiting. 12 each 0  . promethazine (PHENERGAN) 25 MG tablet Take 1 tablet (25 mg total) by mouth every 6 (six) hours as needed for nausea or vomiting. 8 tablet 0  .  quinapril (ACCUPRIL) 10 MG tablet Take 10 mg by mouth daily.     . ULTICARE SHORT PEN NEEDLES 31G X 8 MM MISC 4 TIMES DAILY 100 each 2   No current facility-administered medications on file prior to visit.     Allergies  Allergen Reactions  . Aspirin Itching  . Flonase [Fluticasone Propionate] Itching  . Prednisone Itching  . Tramadol Itching    History  Alcohol Use No    History  Smoking Status  . Current Every Day Smoker  . Packs/day: 0.50  . Years: 20.00  . Types: Cigarettes  . Last attempt to quit: 04/27/2015  Smokeless Tobacco  . Never Used    Review of Systems  Constitutional: Positive for malaise/fatigue.  HENT: Negative.   Eyes: Positive for blurred vision.  Respiratory: Negative.   Cardiovascular: Negative.   Gastrointestinal: Positive for abdominal pain, heartburn, nausea and vomiting.  Genitourinary: Negative.   Musculoskeletal: Negative.   Skin: Negative.   Neurological: Positive for weakness.  Endo/Heme/Allergies: Negative.   Psychiatric/Behavioral: Negative.     Objective   Vitals:   11/18/16 1402  BP: (!) 156/60  Pulse: (!) 105  Resp: 18  Temp: 98.7 F (37.1 C)    Physical Exam  Constitutional: She is oriented to person, place, and time.  Well-developed well-nourished white female who appears fatigued.  HENT:  Head: Normocephalic and atraumatic.  Eyes: No scleral icterus.  Cardiovascular: Normal rate, regular rhythm and normal heart sounds.  Exam reveals no gallop and no friction rub.   No murmur heard. Pulmonary/Chest: Effort normal and breath sounds normal. No respiratory distress. She has no wheezes. She has no rales.  Abdominal: Soft. Bowel sounds are normal. She exhibits no distension. There is tenderness. There is no rebound and no guarding.  Tender in the right upper quadrant to deep palpation. No rigidity noted.  Neurological: She is alert and oriented to person, place, and time.  Skin: Skin is warm and dry.  Vitals  reviewed.  CT scan of abdomen, ultrasound of gallbladder, HIDA scan all reviewed. She does have cholelithiasis. HIDA scan showed a normal gallbladder ejection fraction but reproducible symptoms with fatty meal. Assessment  Biliary colic, cholelithiasis Chronic anemia Plan   Patient is scheduled for laparoscopic cholecystectomy on 11/21/2016. The risks and benefits of the procedure including bleeding, infection, hepatobiliary injury, the possibility of a blood transfusion, and the possibility of an open procedure were fully explained to the patient, who gave informed consent. 

## 2016-11-18 NOTE — Progress Notes (Signed)
Linda Riggs; [id number]; 01/16/1956   HPI Patient is a 61 year old white female who is referred to my care by Linda Loron, NP of gastroenterology for evaluation treatment of right upper quadrant abdominal pain. Patient states she has had right upper quadrant abdominal pain radiating down the right side of her abdomen and to her back, nausea, vomiting, and fatty food intolerance for 2 months. Her symptoms are becoming more daily in nature. She has been to the emergency room for this problem. She denies any fever, chills, or jaundice. She rates her pain is 10 out of 10. Her appetite is significantly decreased. Past Medical History:  Diagnosis Date  . Arthritis   . Chronic headaches   . Depression   . Diverticulosis   . Fibromyalgia   . GERD (gastroesophageal reflux disease)   . Goiter   . Internal hemorrhoids   . Iron deficiency anemia    Dr. Arlyce Dice 11/10  . Osteoporosis   . Ruptured lumbar disc   . Type 2 diabetes mellitus (HCC)     Past Surgical History:  Procedure Laterality Date  . Arthroscopic left knee surgery Bilateral   . BACK SURGERY    . COLONOSCOPY  2016   Dr. Arlyce Dice: diverticulosis  . DILATION AND CURETTAGE OF UTERUS    . ESOPHAGOGASTRODUODENOSCOPY  2016   Dr. Arlyce Dice: early GE junction stricture s/p dilation   . THYROIDECTOMY N/A 06/06/2013   Procedure: TOTAL THYROIDECTOMY;  Surgeon: Dalia Heading, MD;  Location: [facility];  Service: General;  Laterality: N/A;    Family History  Problem Relation Age of Onset  . Diabetes type II Mother   . Alzheimer's disease Mother   . Diabetes type II Sister   . Diabetes Sister   . Diabetes type II Father   . Cancer Father        Lung  . Diabetes Brother   . Diabetes Sister   . Colon cancer Neg Hx     Current Outpatient Prescriptions on File Prior to Visit  Medication Sig Dispense Refill  . alendronate (FOSAMAX) 70 MG tablet Take 10 mg by mouth once a week.    . Aspirin-Acetaminophen-Caffeine (EXCEDRIN PO) Take 1-2 tablets  by mouth every 4 (four) hours as needed (pain).    Marland Kitchen atorvastatin (LIPITOR) 80 MG tablet Take 80 mg by mouth daily.    . Calcium Carbonate-Vit D-Min (CALCIUM 1200 PO) Take 1 capsule by mouth daily.    Marland Kitchen escitalopram (LEXAPRO) 20 MG tablet Take 20 mg by mouth daily.      Marland Kitchen esomeprazole (NEXIUM) 40 MG capsule Take 40 mg by mouth daily.     . fenofibrate 160 MG tablet Take 160 mg by mouth daily.     . fexofenadine (ALLEGRA) 180 MG tablet Take 180 mg by mouth daily.      Marland Kitchen gabapentin (NEURONTIN) 300 MG capsule Take 300 mg by mouth 3 (three) times daily.    . hydrochlorothiazide (HYDRODIURIL) 25 MG tablet Take 25 mg by mouth daily.    Marland Kitchen HYDROcodone-acetaminophen (NORCO/VICODIN) 5-325 MG tablet 1 or 2 tabs PO q6 hours prn pain 15 tablet 0  . Insulin Detemir (LEVEMIR FLEXTOUCH) 100 UNIT/ML Pen INJECT 55 UNITS SQ AT 10PM (Patient taking differently: 60 Units. INJECT 55 UNITS SQ AT 10PM) 45 mL 0  . INVOKANA 100 MG TABS tablet TAKE ONE (1) TABLET EACH DAY 90 tablet 0  . levothyroxine (SYNTHROID, LEVOTHROID) 125 MCG tablet TAKE ONE TABLET EVERY MORNING 90 tablet 0  . meloxicam (MOBIC)  7.5 MG tablet Take 7.5 mg by mouth 2 (two) times daily.     . metFORMIN (GLUCOPHAGE-XR) 500 MG 24 hr tablet TAKE ONE TABLET BY MOUTH TWICE DAILY 180 tablet 0  . ondansetron (ZOFRAN ODT) 4 MG disintegrating tablet 4mg  ODT q4 hours prn nausea/vomit 12 tablet 0  . ondansetron (ZOFRAN) 4 MG tablet Take 4 mg by mouth daily as needed for nausea or vomiting.    Letta Pate DELICA LANCETS 33G MISC 4 TIMES DAILY 100 each 0  . ONETOUCH VERIO test strip CHECK BLOOD 4 TIMES DAILY 150 each 5  . POTASSIUM PO Take 1 tablet by mouth daily.    . promethazine (PHENERGAN) 25 MG suppository Place 1 suppository (25 mg total) rectally every 6 (six) hours as needed for nausea or vomiting. 12 each 0  . promethazine (PHENERGAN) 25 MG tablet Take 1 tablet (25 mg total) by mouth every 6 (six) hours as needed for nausea or vomiting. 8 tablet 0  .  quinapril (ACCUPRIL) 10 MG tablet Take 10 mg by mouth daily.     Marland Kitchen ULTICARE SHORT PEN NEEDLES 31G X 8 MM MISC 4 TIMES DAILY 100 each 2   No current facility-administered medications on file prior to visit.     Allergies  Allergen Reactions  . Aspirin Itching  . Flonase [Fluticasone Propionate] Itching  . Prednisone Itching  . Tramadol Itching    History  Alcohol Use No    History  Smoking Status  . Current Every Day Smoker  . Packs/day: 0.50  . Years: 20.00  . Types: Cigarettes  . Last attempt to quit: 04/27/2015  Smokeless Tobacco  . Never Used    Review of Systems  Constitutional: Positive for malaise/fatigue.  HENT: Negative.   Eyes: Positive for blurred vision.  Respiratory: Negative.   Cardiovascular: Negative.   Gastrointestinal: Positive for abdominal pain, heartburn, nausea and vomiting.  Genitourinary: Negative.   Musculoskeletal: Negative.   Skin: Negative.   Neurological: Positive for weakness.  Endo/Heme/Allergies: Negative.   Psychiatric/Behavioral: Negative.     Objective   Vitals:   11/18/16 1402  BP: (!) 156/60  Pulse: (!) 105  Resp: 18  Temp: 98.7 F (37.1 C)    Physical Exam  Constitutional: She is oriented to person, place, and time.  Well-developed well-nourished white female who appears fatigued.  HENT:  Head: Normocephalic and atraumatic.  Eyes: No scleral icterus.  Cardiovascular: Normal rate, regular rhythm and normal heart sounds.  Exam reveals no gallop and no friction rub.   No murmur heard. Pulmonary/Chest: Effort normal and breath sounds normal. No respiratory distress. She has no wheezes. She has no rales.  Abdominal: Soft. Bowel sounds are normal. She exhibits no distension. There is tenderness. There is no rebound and no guarding.  Tender in the right upper quadrant to deep palpation. No rigidity noted.  Neurological: She is alert and oriented to person, place, and time.  Skin: Skin is warm and dry.  Vitals  reviewed.  CT scan of abdomen, ultrasound of gallbladder, HIDA scan all reviewed. She does have cholelithiasis. HIDA scan showed a normal gallbladder ejection fraction but reproducible symptoms with fatty meal. Assessment  Biliary colic, cholelithiasis Chronic anemia Plan   Patient is scheduled for laparoscopic cholecystectomy on 11/21/2016. The risks and benefits of the procedure including bleeding, infection, hepatobiliary injury, the possibility of a blood transfusion, and the possibility of an open procedure were fully explained to the patient, who gave informed consent.

## 2016-11-18 NOTE — Patient Instructions (Signed)

## 2016-11-19 ENCOUNTER — Encounter (HOSPITAL_COMMUNITY): Payer: Self-pay

## 2016-11-19 ENCOUNTER — Other Ambulatory Visit: Payer: Self-pay

## 2016-11-19 ENCOUNTER — Encounter (HOSPITAL_COMMUNITY)
Admission: RE | Admit: 2016-11-19 | Discharge: 2016-11-19 | Disposition: A | Discharge status: Home / Self Care | Payer: Medicare Other | Source: Ambulatory Visit | Attending: General Surgery | Admitting: General Surgery

## 2016-11-19 DIAGNOSIS — D649 Anemia, unspecified: Secondary | ICD-10-CM | POA: Diagnosis not present

## 2016-11-19 DIAGNOSIS — K219 Gastro-esophageal reflux disease without esophagitis: Secondary | ICD-10-CM | POA: Diagnosis not present

## 2016-11-19 DIAGNOSIS — K811 Chronic cholecystitis: Secondary | ICD-10-CM | POA: Diagnosis not present

## 2016-11-19 DIAGNOSIS — I452 Bifascicular block: Secondary | ICD-10-CM | POA: Diagnosis not present

## 2016-11-19 DIAGNOSIS — Z79899 Other long term (current) drug therapy: Secondary | ICD-10-CM | POA: Diagnosis not present

## 2016-11-19 DIAGNOSIS — Z791 Long term (current) use of non-steroidal anti-inflammatories (NSAID): Secondary | ICD-10-CM | POA: Diagnosis not present

## 2016-11-19 DIAGNOSIS — F329 Major depressive disorder, single episode, unspecified: Secondary | ICD-10-CM | POA: Diagnosis not present

## 2016-11-19 DIAGNOSIS — F17210 Nicotine dependence, cigarettes, uncomplicated: Secondary | ICD-10-CM | POA: Diagnosis not present

## 2016-11-19 DIAGNOSIS — Z7983 Long term (current) use of bisphosphonates: Secondary | ICD-10-CM | POA: Diagnosis not present

## 2016-11-19 DIAGNOSIS — E890 Postprocedural hypothyroidism: Secondary | ICD-10-CM | POA: Diagnosis not present

## 2016-11-19 DIAGNOSIS — E119 Type 2 diabetes mellitus without complications: Secondary | ICD-10-CM | POA: Diagnosis not present

## 2016-11-19 DIAGNOSIS — I10 Essential (primary) hypertension: Secondary | ICD-10-CM | POA: Diagnosis not present

## 2016-11-19 DIAGNOSIS — R1011 Right upper quadrant pain: Secondary | ICD-10-CM | POA: Diagnosis present

## 2016-11-19 DIAGNOSIS — Z794 Long term (current) use of insulin: Secondary | ICD-10-CM | POA: Diagnosis not present

## 2016-11-19 HISTORY — DX: Hypothyroidism, unspecified: E03.9

## 2016-11-19 LAB — CBC WITH DIFFERENTIAL/PLATELET
BASOS ABS: 0.1 10*3/uL (ref 0.0–0.1)
BASOS PCT: 1 %
EOS ABS: 0.2 10*3/uL (ref 0.0–0.7)
Eosinophils Relative: 2 %
HEMATOCRIT: 31.4 % — AB (ref 36.0–46.0)
Hemoglobin: 9.4 g/dL — ABNORMAL LOW (ref 12.0–15.0)
Lymphocytes Relative: 18 %
Lymphs Abs: 1.6 10*3/uL (ref 0.7–4.0)
MCH: 21 pg — ABNORMAL LOW (ref 26.0–34.0)
MCHC: 29.9 g/dL — AB (ref 30.0–36.0)
MCV: 70.2 fL — ABNORMAL LOW (ref 78.0–100.0)
MONO ABS: 0.4 10*3/uL (ref 0.1–1.0)
MONOS PCT: 5 %
NEUTROS ABS: 6.3 10*3/uL (ref 1.7–7.7)
Neutrophils Relative %: 74 %
PLATELETS: 435 10*3/uL — AB (ref 150–400)
RBC: 4.47 MIL/uL (ref 3.87–5.11)
RDW: 20.3 % — AB (ref 11.5–15.5)
WBC: 8.6 10*3/uL (ref 4.0–10.5)

## 2016-11-19 LAB — PREPARE RBC (CROSSMATCH)

## 2016-11-19 NOTE — Patient Instructions (Signed)
Linda Riggs  11/19/2016     @PREFPERIOPPHARMACY @   Your procedure is scheduled on  11/21/2016   Report to [facility] at * 950  A.M.  Call this number if you have problems the morning of surgery:  [phone]   Remember:  Do not eat food or drink liquids after midnight.  Take these medicines the morning of surgery with A SIP OF WATER  Lexapro,nexium, allegra, neurontin, hydrocodone, levothyroxine, mobic, zofran or phenergan, accupril. Take 1/2 of your usual night time insulin the night before your surgery. DO NOT take any medications the morning of your surgery.   Do not wear jewelry, make-up or nail polish.  Do not wear lotions, powders, or perfumes, or deoderant.  Do not shave 48 hours prior to surgery.  Men may shave face and neck.  Do not bring valuables to the hospital.  [facility] is not responsible for any belongings or valuables.  Contacts, dentures or bridgework may not be worn into surgery.  Leave your suitcase in the car.  After surgery it may be brought to your room.  For patients admitted to the hospital, discharge time will be determined by your treatment team.  Patients discharged the day of surgery will not be allowed to drive home.   Name and phone number of your driver:   family Special instructions:  None  Please read over the following fact sheets that you were given. Anesthesia Post-op Instructions and Care and Recovery After Surgery       Laparoscopic Cholecystectomy Laparoscopic cholecystectomy is surgery to remove the gallbladder. The gallbladder is a pear-shaped organ that lies beneath the liver on the right side of the body. The gallbladder stores bile, which is a fluid that helps the body to digest fats. Cholecystectomy is often done for inflammation of the gallbladder (cholecystitis). This condition is usually caused by a buildup of gallstones (cholelithiasis) in the gallbladder. Gallstones can block the flow of bile, which can  result in inflammation and pain. In severe cases, emergency surgery may be required. This procedure is done though small incisions in your abdomen (laparoscopic surgery). A thin scope with a camera (laparoscope) is inserted through one incision. Thin surgical instruments are inserted through the other incisions. In some cases, a laparoscopic procedure may be turned into a type of surgery that is done through a larger incision (open surgery). Tell a health care provider about:  Any allergies you have.  All medicines you are taking, including vitamins, herbs, eye drops, creams, and over-the-counter medicines.  Any problems you or family members have had with anesthetic medicines.  Any blood disorders you have.  Any surgeries you have had.  Any medical conditions you have.  Whether you are pregnant or may be pregnant. What are the risks? Generally, this is a safe procedure. However, problems may occur, including:  Infection.  Bleeding.  Allergic reactions to medicines.  Damage to other structures or organs.  A stone remaining in the common bile duct. The common bile duct carries bile from the gallbladder into the small intestine.  A bile leak from the cyst duct that is clipped when your gallbladder is removed.  What happens before the procedure? Staying hydrated Follow instructions from your health care provider about hydration, which may include:  Up to 2 hours before the procedure - you may continue to drink clear liquids, such as water, clear fruit juice, black coffee, and plain tea.  Eating and  drinking restrictions Follow instructions from your health care provider about eating and drinking, which may include:  8 hours before the procedure - stop eating heavy meals or foods such as meat, fried foods, or fatty foods.  6 hours before the procedure - stop eating light meals or foods, such as toast or cereal.  6 hours before the procedure - stop drinking milk or drinks that  contain milk.  2 hours before the procedure - stop drinking clear liquids.  Medicines  Ask your health care provider about: ? Changing or stopping your regular medicines. This is especially important if you are taking diabetes medicines or blood thinners. ? Taking medicines such as aspirin and ibuprofen. These medicines can thin your blood. Do not take these medicines before your procedure if your health care provider instructs you not to.  You may be given antibiotic medicine to help prevent infection. General instructions  Let your health care provider know if you develop a cold or an infection before surgery.  Plan to have someone take you home from the hospital or clinic.  Ask your health care provider how your surgical site will be marked or identified. What happens during the procedure?  To reduce your risk of infection: ? Your health care team will wash or sanitize their hands. ? Your skin will be washed with soap. ? Hair may be removed from the surgical area.  An IV tube may be inserted into one of your veins.  You will be given one or more of the following: ? A medicine to help you relax (sedative). ? A medicine to make you fall asleep (general anesthetic).  A breathing tube will be placed in your mouth.  Your surgeon will make several small cuts (incisions) in your abdomen.  The laparoscope will be inserted through one of the small incisions. The camera on the laparoscope will send images to a TV screen (monitor) in the operating room. This lets your surgeon see inside your abdomen.  Air-like gas will be pumped into your abdomen. This will expand your abdomen to give the surgeon more room to perform the surgery.  Other tools that are needed for the procedure will be inserted through the other incisions. The gallbladder will be removed through one of the incisions.  Your common bile duct may be examined. If stones are found in the common bile duct, they may be  removed.  After your gallbladder has been removed, the incisions will be closed with stitches (sutures), staples, or skin glue.  Your incisions may be covered with a bandage (dressing). The procedure may vary among health care providers and hospitals. What happens after the procedure?  Your blood pressure, heart rate, breathing rate, and blood oxygen level will be monitored until the medicines you were given have worn off.  You will be given medicines as needed to control your pain.  Do not drive for 24 hours if you were given a sedative. This information is not intended to replace advice given to you by your health care provider. Make sure you discuss any questions you have with your health care provider. Document Released: 03/17/2005 Document Revised: 10/07/2015 Document Reviewed: 09/03/2015 Elsevier Interactive Patient Education  2018 [employer].  Laparoscopic Cholecystectomy, Care After This sheet gives you information about how to care for yourself after your procedure. Your health care provider may also give you more specific instructions. If you have problems or questions, contact your health care provider. What can I expect after the procedure? After  the procedure, it is common to have:  Pain at your incision sites. You will be given medicines to control this pain.  Mild nausea or vomiting.  Bloating and possible shoulder pain from the air-like gas that was used during the procedure.  Follow these instructions at home: Incision care   Follow instructions from your health care provider about how to take care of your incisions. Make sure you: ? Wash your hands with soap and water before you change your bandage (dressing). If soap and water are not available, use hand sanitizer. ? Change your dressing as told by your health care provider. ? Leave stitches (sutures), skin glue, or adhesive strips in place. These skin closures may need to be in place for 2 weeks or longer. If  adhesive strip edges start to loosen and curl up, you may trim the loose edges. Do not remove adhesive strips completely unless your health care provider tells you to do that.  Do not take baths, swim, or use a hot tub until your health care provider approves. Ask your health care provider if you can take showers. You may only be allowed to take sponge baths for bathing.  Check your incision area every day for signs of infection. Check for: ? More redness, swelling, or pain. ? More fluid or blood. ? Warmth. ? Pus or a bad smell. Activity  Do not drive or use heavy machinery while taking prescription pain medicine.  Do not lift anything that is heavier than 10 lb (4.5 kg) until your health care provider approves.  Do not play contact sports until your health care provider approves.  Do not drive for 24 hours if you were given a medicine to help you relax (sedative).  Rest as needed. Do not return to work or school until your health care provider approves. General instructions  Take over-the-counter and prescription medicines only as told by your health care provider.  To prevent or treat constipation while you are taking prescription pain medicine, your health care provider may recommend that you: ? Drink enough fluid to keep your urine clear or pale yellow. ? Take over-the-counter or prescription medicines. ? Eat foods that are high in fiber, such as fresh fruits and vegetables, whole grains, and beans. ? Limit foods that are high in fat and processed sugars, such as fried and sweet foods. Contact a health care provider if:  You develop a rash.  You have more redness, swelling, or pain around your incisions.  You have more fluid or blood coming from your incisions.  Your incisions feel warm to the touch.  You have pus or a bad smell coming from your incisions.  You have a fever.  One or more of your incisions breaks open. Get help right away if:  You have trouble  breathing.  You have chest pain.  You have increasing pain in your shoulders.  You faint or feel dizzy when you stand.  You have severe pain in your abdomen.  You have nausea or vomiting that lasts for more than one day.  You have leg pain. This information is not intended to replace advice given to you by your health care provider. Make sure you discuss any questions you have with your health care provider. Document Released: 03/17/2005 Document Revised: 10/06/2015 Document Reviewed: 09/03/2015 Elsevier Interactive Patient Education  2017 [employer]  General Anesthesia, Adult General anesthesia is the use of medicines to make a person "go to sleep" (be unconscious) for a medical procedure.  General anesthesia is often recommended when a procedure:  Is long.  Requires you to be still or in an unusual position.  Is major and can cause you to lose blood.  Is impossible to do without general anesthesia.  The medicines used for general anesthesia are called general anesthetics. In addition to making you sleep, the medicines:  Prevent pain.  Control your blood pressure.  Relax your muscles.  Tell a health care provider about:  Any allergies you have.  All medicines you are taking, including vitamins, herbs, eye drops, creams, and over-the-counter medicines.  Any problems you or family members have had with anesthetic medicines.  Types of anesthetics you have had in the past.  Any bleeding disorders you have.  Any surgeries you have had.  Any medical conditions you have.  Any history of heart or lung conditions, such as heart failure, sleep apnea, or chronic obstructive pulmonary disease (COPD).  Whether you are pregnant or may be pregnant.  Whether you use tobacco, alcohol, marijuana, or street drugs.  Any history of Financial [employer].  Any history of depression or anxiety. What are the risks? Generally, this is a safe procedure. However, problems may occur,  including:  Allergic reaction to anesthetics.  Lung and heart problems.  Inhaling food or liquids from your stomach into your lungs (aspiration).  Injury to nerves.  Waking up during your procedure and being unable to move (rare).  Extreme agitation or a state of mental confusion (delirium) when you wake up from the anesthetic.  Air in the bloodstream, which can lead to stroke.  These problems are more likely to develop if you are having a major surgery or if you have an advanced medical condition. You can prevent some of these complications by answering all of your health care provider's questions thoroughly and by following all pre-procedure instructions. General anesthesia can cause side effects, including:  Nausea or vomiting  A sore throat from the breathing tube.  Feeling cold or shivery.  Feeling tired, washed out, or achy.  Sleepiness or drowsiness.  Confusion or agitation.  What happens before the procedure? Staying hydrated Follow instructions from your health care provider about hydration, which may include:  Up to 2 hours before the procedure - you may continue to drink clear liquids, such as water, clear fruit juice, black coffee, and plain tea.  Eating and drinking restrictions Follow instructions from your health care provider about eating and drinking, which may include:  8 hours before the procedure - stop eating heavy meals or foods such as meat, fried foods, or fatty foods.  6 hours before the procedure - stop eating light meals or foods, such as toast or cereal.  6 hours before the procedure - stop drinking milk or drinks that contain milk.  2 hours before the procedure - stop drinking clear liquids.  Medicines  Ask your health care provider about: ? Changing or stopping your regular medicines. This is especially important if you are taking diabetes medicines or blood thinners. ? Taking medicines such as aspirin and ibuprofen. These medicines  can thin your blood. Do not take these medicines before your procedure if your health care provider instructs you not to. ? Taking new dietary supplements or medicines. Do not take these during the week before your procedure unless your health care provider approves them.  If you are told to take a medicine or to continue taking a medicine on the day of the procedure, take the medicine with sips of water.  General instructions   Ask if you will be going home the same day, the following day, or after a longer hospital stay. ? Plan to have someone take you home. ? Plan to have someone stay with you for the first 24 hours after you leave the hospital or clinic.  For 3-6 weeks before the procedure, try not to use any tobacco products, such as cigarettes, chewing tobacco, and e-cigarettes.  You may brush your teeth on the morning of the procedure, but make sure to spit out the toothpaste. What happens during the procedure?  You will be given anesthetics through a mask and through an IV tube in one of your veins.  You may receive medicine to help you relax (sedative).  As soon as you are asleep, a breathing tube may be used to help you breathe.  An anesthesia specialist will stay with you throughout the procedure. He or she will help keep you comfortable and safe by continuing to give you medicines and adjusting the amount of medicine that you get. He or she will also watch your blood pressure, pulse, and oxygen levels to make sure that the anesthetics do not cause any problems.  If a breathing tube was used to help you breathe, it will be removed before you wake up. The procedure may vary among health care providers and hospitals. What happens after the procedure?  You will wake up, often slowly, after the procedure is complete, usually in a recovery area.  Your blood pressure, heart rate, breathing rate, and blood oxygen level will be monitored until the medicines you were given have worn  off.  You may be given medicine to help you calm down if you feel anxious or agitated.  If you will be going home the same day, your health care provider may check to make sure you can stand, drink, and urinate.  Your health care providers will treat your pain and side effects before you go home.  Do not drive for 24 hours if you received a sedative.  You may: ? Feel nauseous and vomit. ? Have a sore throat. ? Have mental slowness. ? Feel cold or shivery. ? Feel sleepy. ? Feel tired. ? Feel sore or achy, even in parts of your body where you did not have surgery. This information is not intended to replace advice given to you by your health care provider. Make sure you discuss any questions you have with your health care provider. Document Released: 06/24/2007 Document Revised: 08/28/2015 Document Reviewed: 03/01/2015 Elsevier Interactive Patient Education  2018 [employer]. General Anesthesia, Adult, Care After These instructions provide you with information about caring for yourself after your procedure. Your health care provider may also give you more specific instructions. Your treatment has been planned according to current medical practices, but problems sometimes occur. Call your health care provider if you have any problems or questions after your procedure. What can I expect after the procedure? After the procedure, it is common to have:  Vomiting.  A sore throat.  Mental slowness.  It is common to feel:  Nauseous.  Cold or shivery.  Sleepy.  Tired.  Sore or achy, even in parts of your body where you did not have surgery.  Follow these instructions at home: For at least 24 hours after the procedure:  Do not: ? Participate in activities where you could fall or become injured. ? Drive. ? Use heavy machinery. ? Drink alcohol. ? Take sleeping pills or medicines that cause drowsiness. ?  Make important decisions or sign legal documents. ? Take care of  children on your own.  Rest. Eating and drinking  If you vomit, drink water, juice, or soup when you can drink without vomiting.  Drink enough fluid to keep your urine clear or pale yellow.  Make sure you have little or no nausea before eating solid foods.  Follow the diet recommended by your health care provider. General instructions  Have a responsible adult stay with you until you are awake and alert.  Return to your normal activities as told by your health care provider. Ask your health care provider what activities are safe for you.  Take over-the-counter and prescription medicines only as told by your health care provider.  If you smoke, do not smoke without supervision.  Keep all follow-up visits as told by your health care provider. This is important. Contact a health care provider if:  You continue to have nausea or vomiting at home, and medicines are not helpful.  You cannot drink fluids or start eating again.  You cannot urinate after 8-12 hours.  You develop a skin rash.  You have fever.  You have increasing redness at the site of your procedure. Get help right away if:  You have difficulty breathing.  You have chest pain.  You have unexpected bleeding.  You feel that you are having a life-threatening or urgent problem. This information is not intended to replace advice given to you by your health care provider. Make sure you discuss any questions you have with your health care provider. Document Released: 06/23/2000 Document Revised: 08/20/2015 Document Reviewed: 03/01/2015 Elsevier Interactive Patient Education  [employer].

## 2016-11-21 ENCOUNTER — Encounter (HOSPITAL_COMMUNITY)
Admission: RE | Disposition: A | Discharge status: Home / Self Care | Payer: Self-pay | Source: Ambulatory Visit | Attending: General Surgery

## 2016-11-21 ENCOUNTER — Ambulatory Visit (HOSPITAL_COMMUNITY)
Admission: RE | Admit: 2016-11-21 | Discharge: 2016-11-21 | Disposition: A | Discharge status: Home / Self Care | Payer: Medicare Other | Source: Ambulatory Visit | Attending: General Surgery | Admitting: General Surgery

## 2016-11-21 ENCOUNTER — Ambulatory Visit (HOSPITAL_COMMUNITY): Payer: Medicare Other | Admitting: Anesthesiology

## 2016-11-21 ENCOUNTER — Encounter (HOSPITAL_COMMUNITY): Payer: Self-pay | Admitting: *Deleted

## 2016-11-21 DIAGNOSIS — E119 Type 2 diabetes mellitus without complications: Secondary | ICD-10-CM | POA: Diagnosis not present

## 2016-11-21 DIAGNOSIS — Z794 Long term (current) use of insulin: Secondary | ICD-10-CM | POA: Insufficient documentation

## 2016-11-21 DIAGNOSIS — E890 Postprocedural hypothyroidism: Secondary | ICD-10-CM | POA: Insufficient documentation

## 2016-11-21 DIAGNOSIS — K219 Gastro-esophageal reflux disease without esophagitis: Secondary | ICD-10-CM | POA: Insufficient documentation

## 2016-11-21 DIAGNOSIS — F329 Major depressive disorder, single episode, unspecified: Secondary | ICD-10-CM | POA: Insufficient documentation

## 2016-11-21 DIAGNOSIS — Z7983 Long term (current) use of bisphosphonates: Secondary | ICD-10-CM | POA: Insufficient documentation

## 2016-11-21 DIAGNOSIS — K811 Chronic cholecystitis: Secondary | ICD-10-CM | POA: Insufficient documentation

## 2016-11-21 DIAGNOSIS — Z791 Long term (current) use of non-steroidal anti-inflammatories (NSAID): Secondary | ICD-10-CM | POA: Insufficient documentation

## 2016-11-21 DIAGNOSIS — Z79899 Other long term (current) drug therapy: Secondary | ICD-10-CM | POA: Insufficient documentation

## 2016-11-21 DIAGNOSIS — K8020 Calculus of gallbladder without cholecystitis without obstruction: Secondary | ICD-10-CM | POA: Diagnosis not present

## 2016-11-21 DIAGNOSIS — I10 Essential (primary) hypertension: Secondary | ICD-10-CM | POA: Insufficient documentation

## 2016-11-21 DIAGNOSIS — D649 Anemia, unspecified: Secondary | ICD-10-CM | POA: Diagnosis not present

## 2016-11-21 DIAGNOSIS — I452 Bifascicular block: Secondary | ICD-10-CM | POA: Insufficient documentation

## 2016-11-21 DIAGNOSIS — F17210 Nicotine dependence, cigarettes, uncomplicated: Secondary | ICD-10-CM | POA: Insufficient documentation

## 2016-11-21 HISTORY — PX: CHOLECYSTECTOMY: SHX55

## 2016-11-21 LAB — GLUCOSE, CAPILLARY
GLUCOSE-CAPILLARY: 154 mg/dL — AB (ref 65–99)
GLUCOSE-CAPILLARY: 199 mg/dL — AB (ref 65–99)

## 2016-11-21 SURGERY — LAPAROSCOPIC CHOLECYSTECTOMY
Anesthesia: General

## 2016-11-21 MED ORDER — FENTANYL CITRATE (PF) 100 MCG/2ML IJ SOLN
INTRAMUSCULAR | Status: DC | PRN
  Administered 2016-11-21 (×3): 50 ug via INTRAVENOUS

## 2016-11-21 MED ORDER — CHLORHEXIDINE GLUCONATE CLOTH 2 % EX PADS: 6.0000 | MEDICATED_PAD | Freq: Once | CUTANEOUS | Status: DC

## 2016-11-21 MED ORDER — LACTATED RINGERS IV SOLN
INTRAVENOUS | Status: DC
  Administered 2016-11-21: 11:00:00 via INTRAVENOUS

## 2016-11-21 MED ORDER — ACETAMINOPHEN 325 MG PO TABS
ORAL_TABLET | ORAL | Status: AC
  Filled 2016-11-21: qty 2

## 2016-11-21 MED ORDER — HYDRALAZINE HCL 20 MG/ML IJ SOLN
INTRAMUSCULAR | Status: AC
  Filled 2016-11-21: qty 1

## 2016-11-21 MED ORDER — ONDANSETRON 4 MG PO TBDP
4.0000 mg | ORAL_TABLET | Freq: Once | ORAL | Status: AC
  Administered 2016-11-21: 4 mg via ORAL

## 2016-11-21 MED ORDER — HEMOSTATIC AGENTS (NO CHARGE) OPTIME
TOPICAL | Status: DC | PRN
  Administered 2016-11-21: 2 via TOPICAL

## 2016-11-21 MED ORDER — MIDAZOLAM HCL 2 MG/2ML IJ SOLN
INTRAMUSCULAR | Status: AC
  Filled 2016-11-21: qty 2

## 2016-11-21 MED ORDER — FENTANYL CITRATE (PF) 250 MCG/5ML IJ SOLN
INTRAMUSCULAR | Status: AC
  Filled 2016-11-21: qty 5

## 2016-11-21 MED ORDER — SUCCINYLCHOLINE CHLORIDE 20 MG/ML IJ SOLN
INTRAMUSCULAR | Status: AC
Start: 2016-11-21 — End: 2016-11-21
  Filled 2016-11-21: qty 1

## 2016-11-21 MED ORDER — ACETAMINOPHEN 325 MG PO TABS
650.0000 mg | ORAL_TABLET | Freq: Four times a day (QID) | ORAL | Status: DC | PRN
  Administered 2016-11-21: 650 mg via ORAL

## 2016-11-21 MED ORDER — ROCURONIUM BROMIDE 100 MG/10ML IV SOLN
INTRAVENOUS | Status: DC | PRN
  Administered 2016-11-21: 20 mg via INTRAVENOUS
  Administered 2016-11-21: 5 mg via INTRAVENOUS

## 2016-11-21 MED ORDER — CIPROFLOXACIN IN D5W 400 MG/200ML IV SOLN
400.0000 mg | INTRAVENOUS | Status: AC
  Administered 2016-11-21: 400 mg via INTRAVENOUS

## 2016-11-21 MED ORDER — PROPOFOL 10 MG/ML IV BOLUS
INTRAVENOUS | Status: DC | PRN
  Administered 2016-11-21: 140 mg via INTRAVENOUS

## 2016-11-21 MED ORDER — BUPIVACAINE HCL (PF) 0.5 % IJ SOLN
INTRAMUSCULAR | Status: DC | PRN
  Administered 2016-11-21: 10 mL

## 2016-11-21 MED ORDER — ONDANSETRON 4 MG PO TBDP
ORAL_TABLET | ORAL | Status: AC
  Filled 2016-11-21: qty 1

## 2016-11-21 MED ORDER — LIDOCAINE HCL 1 % IJ SOLN
INTRAMUSCULAR | Status: DC | PRN
  Administered 2016-11-21: 25 mg via INTRADERMAL

## 2016-11-21 MED ORDER — HYDRALAZINE HCL 20 MG/ML IJ SOLN
5.0000 mg | Freq: Once | INTRAMUSCULAR | Status: AC
  Administered 2016-11-21: 5 mg via INTRAVENOUS

## 2016-11-21 MED ORDER — NEOSTIGMINE METHYLSULFATE 10 MG/10ML IV SOLN
INTRAVENOUS | Status: DC | PRN
  Administered 2016-11-21: 1 mg via INTRAVENOUS

## 2016-11-21 MED ORDER — OXYCODONE HCL 5 MG PO TABS
5.0000 mg | ORAL_TABLET | Freq: Once | ORAL | Status: AC | PRN
  Administered 2016-11-21: 5 mg via ORAL
  Filled 2016-11-21: qty 1

## 2016-11-21 MED ORDER — HYDROCODONE-ACETAMINOPHEN 5-325 MG PO TABS: ORAL_TABLET | ORAL | 0 refills | Status: DC

## 2016-11-21 MED ORDER — METOCLOPRAMIDE HCL 5 MG/ML IJ SOLN
INTRAMUSCULAR | Status: AC
  Filled 2016-11-21: qty 2

## 2016-11-21 MED ORDER — KETOROLAC TROMETHAMINE 30 MG/ML IJ SOLN
INTRAMUSCULAR | Status: AC
  Filled 2016-11-21: qty 1

## 2016-11-21 MED ORDER — FENTANYL CITRATE (PF) 100 MCG/2ML IJ SOLN
25.0000 ug | INTRAMUSCULAR | Status: DC | PRN
  Administered 2016-11-21: 25 ug via INTRAVENOUS
  Administered 2016-11-21: 50 ug via INTRAVENOUS
  Filled 2016-11-21 (×2): qty 2

## 2016-11-21 MED ORDER — GLYCOPYRROLATE 0.2 MG/ML IJ SOLN
INTRAMUSCULAR | Status: DC | PRN
  Administered 2016-11-21: 0.6 mg via INTRAVENOUS

## 2016-11-21 MED ORDER — METOCLOPRAMIDE HCL 5 MG/ML IJ SOLN
10.0000 mg | Freq: Once | INTRAMUSCULAR | Status: AC
  Administered 2016-11-21: 10 mg via INTRAVENOUS

## 2016-11-21 MED ORDER — POVIDONE-IODINE 10 % OINT PACKET
TOPICAL_OINTMENT | CUTANEOUS | Status: DC | PRN
Start: 2016-11-21 — End: 2016-11-21
  Administered 2016-11-21: 1 via TOPICAL

## 2016-11-21 MED ORDER — CIPROFLOXACIN IN D5W 400 MG/200ML IV SOLN
INTRAVENOUS | Status: AC
  Filled 2016-11-21: qty 200

## 2016-11-21 MED ORDER — LABETALOL HCL 5 MG/ML IV SOLN
INTRAVENOUS | Status: DC | PRN
  Administered 2016-11-21: 5 mg via INTRAVENOUS

## 2016-11-21 MED ORDER — SUCCINYLCHOLINE CHLORIDE 20 MG/ML IJ SOLN
INTRAMUSCULAR | Status: DC | PRN
  Administered 2016-11-21: 140 mg via INTRAVENOUS

## 2016-11-21 MED ORDER — KETOROLAC TROMETHAMINE 30 MG/ML IJ SOLN
30.0000 mg | Freq: Once | INTRAMUSCULAR | Status: AC
  Administered 2016-11-21: 30 mg via INTRAVENOUS

## 2016-11-21 MED ORDER — OXYCODONE HCL 5 MG/5ML PO SOLN: 5.0000 mg | Freq: Once | ORAL | Status: AC | PRN

## 2016-11-21 MED ORDER — MIDAZOLAM HCL 5 MG/5ML IJ SOLN
INTRAMUSCULAR | Status: DC | PRN
  Administered 2016-11-21: 2 mg via INTRAVENOUS

## 2016-11-21 MED ORDER — MIDAZOLAM HCL 2 MG/2ML IJ SOLN
1.0000 mg | Freq: Once | INTRAMUSCULAR | Status: AC | PRN
  Administered 2016-11-21: 2 mg via INTRAVENOUS

## 2016-11-21 MED ORDER — SODIUM CHLORIDE 0.9 % IR SOLN
Status: DC | PRN
  Administered 2016-11-21: 1000 mL

## 2016-11-21 MED ORDER — GLYCOPYRROLATE 0.2 MG/ML IJ SOLN
INTRAMUSCULAR | Status: AC
  Filled 2016-11-21: qty 3

## 2016-11-21 SURGICAL SUPPLY — 48 items
APPLIER CLIP LAPSCP 10X32 DD (CLIP) ×3 IMPLANT
BAG HAMPER (MISCELLANEOUS) ×3 IMPLANT
BAG RETRIEVAL 10 (BASKET) ×1
BAG RETRIEVAL 10MM (BASKET) ×1
CHLORAPREP W/TINT 26ML (MISCELLANEOUS) ×3 IMPLANT
CLOTH BEACON ORANGE TIMEOUT ST (SAFETY) ×3 IMPLANT
COVER LIGHT HANDLE STERIS (MISCELLANEOUS) ×6 IMPLANT
CUTTER FLEX LINEAR 45M (STAPLE) ×2 IMPLANT
DECANTER SPIKE VIAL GLASS SM (MISCELLANEOUS) ×3 IMPLANT
ELECT REM PT RETURN 9FT ADLT (ELECTROSURGICAL) ×3
ELECTRODE REM PT RTRN 9FT ADLT (ELECTROSURGICAL) ×1 IMPLANT
FILTER SMOKE EVAC LAPAROSHD (FILTER) ×3 IMPLANT
FORMALIN 10 PREFIL 120ML (MISCELLANEOUS) ×3 IMPLANT
GLOVE BIOGEL PI IND STRL 7.0 (GLOVE) ×1 IMPLANT
GLOVE BIOGEL PI INDICATOR 7.0 (GLOVE) ×2
GLOVE SURG SS PI 7.5 STRL IVOR (GLOVE) ×3 IMPLANT
GOWN STRL REUS W/ TWL XL LVL3 (GOWN DISPOSABLE) ×1 IMPLANT
GOWN STRL REUS W/TWL LRG LVL3 (GOWN DISPOSABLE) ×6 IMPLANT
GOWN STRL REUS W/TWL XL LVL3 (GOWN DISPOSABLE) ×3
HEMOSTAT SNOW SURGICEL 2X4 (HEMOSTASIS) ×5 IMPLANT
INST SET LAPROSCOPIC AP (KITS) ×3 IMPLANT
IV NS IRRIG 3000ML ARTHROMATIC (IV SOLUTION) IMPLANT
KIT ROOM TURNOVER APOR (KITS) ×3 IMPLANT
MANIFOLD NEPTUNE II (INSTRUMENTS) ×3 IMPLANT
NDL INSUFFLATION 14GA 120MM (NEEDLE) ×1 IMPLANT
NEEDLE INSUFFLATION 14GA 120MM (NEEDLE) ×3 IMPLANT
NS IRRIG 1000ML POUR BTL (IV SOLUTION) ×3 IMPLANT
PACK LAP CHOLE LZT030E (CUSTOM PROCEDURE TRAY) ×3 IMPLANT
PAD ARMBOARD 7.5X6 YLW CONV (MISCELLANEOUS) ×3 IMPLANT
RELOAD 45 VASCULAR/THIN (ENDOMECHANICALS) ×3 IMPLANT
RELOAD STAPLE 45 2.5 WHT GRN (ENDOMECHANICALS) IMPLANT
SET BASIN LINEN APH (SET/KITS/TRAYS/PACK) ×3 IMPLANT
SET TUBE IRRIG SUCTION NO TIP (IRRIGATION / IRRIGATOR) IMPLANT
SLEEVE ENDOPATH XCEL 5M (ENDOMECHANICALS) ×3 IMPLANT
SPONGE GAUZE 2X2 8PLY STER LF (GAUZE/BANDAGES/DRESSINGS) ×1
SPONGE GAUZE 2X2 8PLY STRL LF (GAUZE/BANDAGES/DRESSINGS) ×5 IMPLANT
STAPLER VISISTAT (STAPLE) ×3 IMPLANT
SUT VICRYL 0 UR6 27IN ABS (SUTURE) ×3 IMPLANT
SYS BAG RETRIEVAL 10MM (BASKET) ×1
SYSTEM BAG RETRIEVAL 10MM (BASKET) ×1 IMPLANT
TAPE PAPER 3X10 WHT MICROPORE (GAUZE/BANDAGES/DRESSINGS) ×2 IMPLANT
TROCAR ENDO BLADELESS 11MM (ENDOMECHANICALS) ×3 IMPLANT
TROCAR XCEL NON-BLD 5MMX100MML (ENDOMECHANICALS) ×3 IMPLANT
TROCAR XCEL UNIV SLVE 11M 100M (ENDOMECHANICALS) ×3 IMPLANT
TUBE CONNECTING 12'X1/4 (SUCTIONS) ×1
TUBE CONNECTING 12X1/4 (SUCTIONS) ×2 IMPLANT
TUBING INSUFFLATION (TUBING) ×3 IMPLANT
WARMER LAPAROSCOPE (MISCELLANEOUS) ×3 IMPLANT

## 2016-11-21 NOTE — Anesthesia Procedure Notes (Signed)
Procedure Name: Intubation Date/Time: 11/21/2016 11:29 AM Performed by: Charmaine Downs Pre-anesthesia Checklist: Patient identified, Patient being monitored, Timeout performed, Emergency Drugs available and Suction available Patient Re-evaluated:Patient Re-evaluated prior to induction Oxygen Delivery Method: Circle System Utilized Preoxygenation: Pre-oxygenation with 100% oxygen Induction Type: IV induction Ventilation: Mask ventilation without difficulty Laryngoscope Size: Mac and 4 Grade View: Grade I Tube type: Oral Tube size: 7.0 mm Number of attempts: 1 Airway Equipment and Method: stylet Placement Confirmation: ETT inserted through vocal cords under direct vision,  positive ETCO2 and breath sounds checked- equal and bilateral Secured at: 22 cm Tube secured with: Tape Dental Injury: Teeth and Oropharynx as per pre-operative assessment

## 2016-11-21 NOTE — Anesthesia Postprocedure Evaluation (Signed)
Anesthesia Post Note  Patient: Linda Riggs  Procedure(s) Performed: Procedure(s) (LRB): LAPAROSCOPIC CHOLECYSTECTOMY (N/A)  Patient location during evaluation: PACU Anesthesia Type: General Level of consciousness: patient cooperative and awake Pain management: pain level controlled Vital Signs Assessment: post-procedure vital signs reviewed and stable Respiratory status: spontaneous breathing, nonlabored ventilation and respiratory function stable Cardiovascular status: blood pressure returned to baseline Postop Assessment: no signs of nausea or vomiting Anesthetic complications: no     Last Vitals:  Vitals:   11/21/16 1300 11/21/16 1315  BP: (!) 188/71   Pulse: 73 74  Resp: (!) 22 12  Temp:    SpO2: 96% 92%    Last Pain:  Vitals:   11/21/16 1017  TempSrc: Oral                 Fredi Geiler J

## 2016-11-21 NOTE — Discharge Instructions (Signed)
Laparoscopic Cholecystectomy, Care After °This sheet gives you information about how to care for yourself after your procedure. Your health care provider may also give you more specific instructions. If you have problems or questions, contact your health care provider. °What can I expect after the procedure? °After the procedure, it is common to have: °· Pain at your incision sites. You will be given medicines to control this pain. °· Mild nausea or vomiting. °· Bloating and possible shoulder pain from the air-like gas that was used during the procedure. °Follow these instructions at home: °Incision care  ° °· Follow instructions from your health care provider about how to take care of your incisions. Make sure you: °¨ Wash your hands with soap and water before you change your bandage (dressing). If soap and water are not available, use hand sanitizer. °¨ Change your dressing as told by your health care provider. °¨ Leave stitches (sutures), skin glue, or adhesive strips in place. These skin closures may need to be in place for 2 weeks or longer. If adhesive strip edges start to loosen and curl up, you may trim the loose edges. Do not remove adhesive strips completely unless your health care provider tells you to do that. °· Do not take baths, swim, or use a hot tub until your health care provider approves. Ask your health care provider if you can take showers. You may only be allowed to take sponge baths for bathing. °· Check your incision area every day for signs of infection. Check for: °¨ More redness, swelling, or pain. °¨ More fluid or blood. °¨ Warmth. °¨ Pus or a bad smell. °Activity  °· Do not drive or use heavy machinery while taking prescription pain medicine. °· Do not lift anything that is heavier than 10 lb (4.5 kg) until your health care provider approves. °· Do not play contact sports until your health care provider approves. °· Do not drive for 24 hours if you were given a medicine to help you relax  (sedative). °· Rest as needed. Do not return to work or school until your health care provider approves. °General instructions  °· Take over-the-counter and prescription medicines only as told by your health care provider. °· To prevent or treat constipation while you are taking prescription pain medicine, your health care provider may recommend that you: °¨ Drink enough fluid to keep your urine clear or pale yellow. °¨ Take over-the-counter or prescription medicines. °¨ Eat foods that are high in fiber, such as fresh fruits and vegetables, whole grains, and beans. °¨ Limit foods that are high in fat and processed sugars, such as fried and sweet foods. °Contact a health care provider if: °· You develop a rash. °· You have more redness, swelling, or pain around your incisions. °· You have more fluid or blood coming from your incisions. °· Your incisions feel warm to the touch. °· You have pus or a bad smell coming from your incisions. °· You have a fever. °· One or more of your incisions breaks open. °Get help right away if: °· You have trouble breathing. °· You have chest pain. °· You have increasing pain in your shoulders. °· You faint or feel dizzy when you stand. °· You have severe pain in your abdomen. °· You have nausea or vomiting that lasts for more than one day. °· You have leg pain. °This information is not intended to replace advice given to you by your health care provider. Make sure you discuss any   questions you have with your health care provider. °Document Released: 03/17/2005 Document Revised: 10/06/2015 Document Reviewed: 09/03/2015 °Elsevier Interactive Patient Education © 2017 [employer] ° °

## 2016-11-21 NOTE — Op Note (Signed)
Patient:  Linda Riggs  DOB:  Jul 03, 1955  MRN:  [id number]   Preop Diagnosis:  Biliary colic, biliary sludge  Postop Diagnosis:  Same  Procedure:  Laparoscopic cholecystectomy  Surgeon:  Franky Macho, M.D.  Anes:  Gen. endotracheal  Indications:  Patient is a 61 year old white female who presents with biliary colic secondary to biliary sludge. The risks and benefits of the procedure including bleeding, infection, hepatobiliary injury, the possibility of an open procedure were fully explained to the patient, who gave informed consent.  Procedure note:  The patient was placed in the supine position. After induction of general endotracheal anesthesia, the abdomen was prepped and draped using usual sterile technique with DuraPrep. Surgical site confirmation was performed.  A supraumbilical incision was made down to the fascia. A Veress needle was introduced into the abdominal cavity and confirmation of placement was done using the saline drop test. The abdomen was then insufflated to 16 mmHg pressure. An 11 mm trocar was introduced into the abdominal cavity under direct visualization without difficulty. The patient was placed in reverse Trendelenburg position and an additional 11 mm trocar was placed the epigastric region and 5 mm trochars were placed right upper quadrant and right flank regions. The liver was inspected and noted be within normal limits. The gallbladder was floppy and retracted superior and laterally in a dynamic fashion in order to provide a critical view of the triangle of Calot. The cystic duct was first identified. Its juncture to the infundibulum was fully identified. The tissue was tenuous and I did not want to skeletonize the cystic duct. I attempted to place a 10 mm clip, but it just didn't quite cover the whole cystic duct. Thus, a vascular EndoGIA stapler was placed across the cystic duct and fired. The cystic artery was ligated and divided using clips. The gallbladder  was freed away from the gallbladder fossa using Bovie electrocautery. The gallbladder was delivered through the epigastric trocar site using an Endo Catch bag. The gallbladder fossa was inspected and no abnormal bleeding or bile leakage was noted. Surgicel was placed the gallbladder fossa. All fluid and air were then evacuated from the abdominal cavity prior to the removal of the trochars.  All wounds were irrigated with normal saline. All wounds were injected with 0.5% Sensorcaine. The supraumbilical fascia as well as epigastric fascia were reapproximated using 0 Vicryl interrupted sutures. All skin incisions were closed using staples. Betadine ointment and dry sterile dressings were applied.  All tape and needle counts were correct at the end the procedure. The patient was extubated in the operating room and transferred to PACU in stable condition.  Complications:  None  EBL:  Minimal  Specimen:  Gallbladder

## 2016-11-21 NOTE — Anesthesia Preprocedure Evaluation (Addendum)
Anesthesia Evaluation  Patient identified by MRN, date of birth, ID band Patient awake    Reviewed: Patient's Chart, lab work & pertinent test results  Airway Mallampati: I  TM Distance: >3 FB Neck ROM: Full    Dental  (+) Teeth Intact   Pulmonary neg pulmonary ROS, shortness of breath, Current Smoker,    Pulmonary exam normal breath sounds clear to auscultation       Cardiovascular Exercise Tolerance: Good hypertension, Pt. on medications Normal cardiovascular exam Rhythm:Regular Rate:Normal  Bifascicular block  Active at home, not limited by CP/SOB   Neuro/Psych  Headaches, PSYCHIATRIC DISORDERS Depression    GI/Hepatic GERD  Medicated and Controlled,  Endo/Other  diabetes, Well Controlled, Type 2Hypothyroidism (on replacement)   Renal/GU      Musculoskeletal  (+) Arthritis , Osteoarthritis,  Fibromyalgia -  Abdominal   Peds  Hematology  (+) anemia , CBC        Component                Value               Date/Time                 WBC                      8.6                 11/19/2016 1406           RBC                      4.47                11/19/2016 1406           HGB                      9.4 (L)             11/19/2016 1406           HCT                      31.4 (L)            11/19/2016 1406           PLT                      435 (H)             11/19/2016 1406           MCV                      70.2 (L)            11/19/2016 1406           MCH                      21.0 (L)            11/19/2016 1406           MCHC                     29.9 (L)            11/19/2016 1406           RDW  20.3 (H)            11/19/2016 1406           LYMPHSABS                1.6                 11/19/2016 1406           MONOABS                  0.4                 11/19/2016 1406           EOSABS                   0.2                 11/19/2016 1406           BASOSABS                 0.1                  11/19/2016 1406        Anesthesia Other Findings   Reproductive/Obstetrics                            Anesthesia Physical Anesthesia Plan  ASA: III  Anesthesia Plan: General   Post-op Pain Management:    Induction: Intravenous  PONV Risk Score and Plan: Ondansetron and Midazolam  Airway Management Planned:   Additional Equipment:   Intra-op Plan:   Post-operative Plan: Extubation in OR  Informed Consent: I have reviewed the patients History and Physical, chart, labs and discussed the procedure including the risks, benefits and alternatives for the proposed anesthesia with the patient or authorized representative who has indicated his/her understanding and acceptance.   Dental advisory given  Plan Discussed with:   Anesthesia Plan Comments:         Anesthesia Quick Evaluation

## 2016-11-21 NOTE — Transfer of Care (Signed)
Immediate Anesthesia Transfer of Care Note  Patient: Linda Riggs  Procedure(s) Performed: Procedure(s): LAPAROSCOPIC CHOLECYSTECTOMY (N/A)  Patient Location: PACU  Anesthesia Type:General  Level of Consciousness: awake and patient cooperative  Airway & Oxygen Therapy: Patient Spontanous Breathing and Patient connected to face mask oxygen  Post-op Assessment: Report given to RN, Post -op Vital signs reviewed and stable and Patient moving all extremities  Post vital signs: Reviewed and stable  Last Vitals:  Vitals:   11/21/16 1045 11/21/16 1050  BP: 126/67 137/73  Pulse:    Resp: (!) 28 (!) 34  Temp:    SpO2: 97% 98%    Last Pain:  Vitals:   11/21/16 1017  TempSrc: Oral      Patients Stated Pain Goal: 7 (11/21/16 1017)  Complications: No apparent anesthesia complications

## 2016-11-21 NOTE — Interval H&P Note (Signed)
History and Physical Interval Note:  11/21/2016 11:05 AM  Linda Riggs  has presented today for surgery, with the diagnosis of cholelithiasis  The various methods of treatment have been discussed with the patient and family. After consideration of risks, benefits and other options for treatment, the patient has consented to  Procedure(s): LAPAROSCOPIC CHOLECYSTECTOMY (N/A) as a surgical intervention .  The patient's history has been reviewed, patient examined, no change in status, stable for surgery.  I have reviewed the patient's chart and labs.  Questions were answered to the patient's satisfaction.     Franky Macho

## 2016-11-23 LAB — BPAM RBC
Blood Product Expiration Date: 201809212359
Blood Product Expiration Date: 201809262359
UNIT TYPE AND RH: 5100
Unit Type and Rh: 5100

## 2016-11-23 LAB — TYPE AND SCREEN
ABO/RH(D): O POS
Antibody Screen: NEGATIVE
UNIT DIVISION: 0
UNIT DIVISION: 0

## 2016-11-24 ENCOUNTER — Encounter (HOSPITAL_COMMUNITY): Payer: Self-pay | Admitting: General Surgery

## 2016-11-24 NOTE — Progress Notes (Signed)
B12 elevated, is she taking supplements? There is no ferritin on here. Was this cancelled by lab? Needs ferritin as soon as possible.

## 2016-11-25 ENCOUNTER — Other Ambulatory Visit: Payer: Self-pay

## 2016-11-25 DIAGNOSIS — D509 Iron deficiency anemia, unspecified: Secondary | ICD-10-CM

## 2016-11-25 NOTE — Progress Notes (Signed)
Pt is aware of results. Said she is not taking any supplements. The lab did not cancel ferritin and did not see one. I have ordered it and pt said she will go in the next couple of days.

## 2016-12-02 ENCOUNTER — Ambulatory Visit (INDEPENDENT_AMBULATORY_CARE_PROVIDER_SITE_OTHER): Payer: Self-pay | Admitting: General Surgery

## 2016-12-02 ENCOUNTER — Encounter: Payer: Self-pay | Admitting: General Surgery

## 2016-12-02 VITALS — BP 134/59 | HR 88 | Temp 98.0°F | Resp 18 | Ht 62.0 in | Wt 175.0 lb

## 2016-12-02 DIAGNOSIS — Z09 Encounter for follow-up examination after completed treatment for conditions other than malignant neoplasm: Secondary | ICD-10-CM

## 2016-12-02 NOTE — Progress Notes (Signed)
Subjective:     Linda Riggs   Status post laparoscopic cholecystectomy. Doing well. Preoperative symptoms resolved. Objective:    BP (!) 134/59   Pulse 88   Temp 98 F (36.7 C)   Resp 18   Ht 5\' 2"  (1.575 m)   Wt 175 lb (79.4 kg)   BMI 32.01 kg/m   General:  alert, cooperative and no distress  Abdomen is soft, incisions healing well. Staples removed, Steri-Strips applied. Final pathology consistent with diagnosis.     Assessment:    Doing well postoperatively.    Plan:   Gradually resume normal activity. Follow-up here as needed.

## 2016-12-15 ENCOUNTER — Other Ambulatory Visit (HOSPITAL_COMMUNITY): Payer: Self-pay | Admitting: Family Medicine

## 2016-12-15 DIAGNOSIS — Z1231 Encounter for screening mammogram for malignant neoplasm of breast: Secondary | ICD-10-CM

## 2016-12-17 ENCOUNTER — Ambulatory Visit (HOSPITAL_COMMUNITY)
Admission: RE | Admit: 2016-12-17 | Discharge: 2016-12-17 | Disposition: A | Discharge status: Home / Self Care | Payer: Medicare Other | Source: Ambulatory Visit | Attending: Family Medicine | Admitting: Family Medicine

## 2016-12-17 DIAGNOSIS — Z1231 Encounter for screening mammogram for malignant neoplasm of breast: Secondary | ICD-10-CM | POA: Diagnosis present

## 2016-12-18 LAB — HM DIABETES EYE EXAM

## 2016-12-23 NOTE — Progress Notes (Signed)
Can we remind patient we need ferritin completed ASAP.

## 2016-12-23 NOTE — Progress Notes (Signed)
I called to remind pt and she said that she was not going to do it. Said she has always been anemic and her PCP is aware.

## 2016-12-30 NOTE — Progress Notes (Signed)
True. However, if she has notable IDA, then this will guide what evaluations she needs to have done. Obtaining the ferritin is an important piece. IF IDA she will need gastric and duodenal biopsies and if no source identified, would need capsule.

## 2016-12-31 NOTE — Progress Notes (Signed)
Pt returned call. She still says she does not want any more work up on anemia. I tried to explain the importance of finding out why she is anemic. I tried to get her to keep the appt with Tobi Bastos Friday and discuss her problems and the work up. She said no, she is tired of going to doctors and she is not having any problems now, that she will call if she needs anything. She asked me to cancel the appt on Friday and I am doing so.

## 2016-12-31 NOTE — Progress Notes (Signed)
FYI to Anna Boone, NP.  

## 2016-12-31 NOTE — Progress Notes (Signed)
LMOM to call. ( Also, pt has appt with [name], NP on 01/02/2017 at 10:30 AM).

## 2017-01-02 ENCOUNTER — Ambulatory Visit: Payer: Medicare Other | Admitting: Gastroenterology

## 2017-01-06 DIAGNOSIS — G2581 Restless legs syndrome: Secondary | ICD-10-CM | POA: Insufficient documentation

## 2017-01-06 DIAGNOSIS — D638 Anemia in other chronic diseases classified elsewhere: Secondary | ICD-10-CM | POA: Insufficient documentation

## 2017-01-07 LAB — RENAL FUNCTION PANEL
Albumin: 4.1 g/dL (ref 3.6–5.1)
BUN / CREAT RATIO: 19 (calc) (ref 6–22)
BUN: 29 mg/dL — ABNORMAL HIGH (ref 7–25)
CHLORIDE: 103 mmol/L (ref 98–110)
CO2: 22 mmol/L (ref 20–32)
Calcium: 9.3 mg/dL (ref 8.6–10.4)
Creat: 1.49 mg/dL — ABNORMAL HIGH (ref 0.50–0.99)
Glucose, Bld: 107 mg/dL (ref 65–139)
Phosphorus: 4.8 mg/dL — ABNORMAL HIGH (ref 2.5–4.5)
Potassium: 5 mmol/L (ref 3.5–5.3)
SODIUM: 137 mmol/L (ref 135–146)

## 2017-01-07 LAB — HEMOGLOBIN A1C
HEMOGLOBIN A1C: 7.4 %{Hb} — AB (ref <5.7)
Mean Plasma Glucose: 166 (calc)
eAG (mmol/L): 9.2 (calc)

## 2017-01-07 LAB — TSH: TSH: 0.81 mIU/L (ref 0.40–4.50)

## 2017-01-07 LAB — T4, FREE: Free T4: 1.3 ng/dL (ref 0.8–1.8)

## 2017-01-13 ENCOUNTER — Encounter: Payer: Self-pay | Admitting: "Endocrinology

## 2017-01-13 ENCOUNTER — Ambulatory Visit (INDEPENDENT_AMBULATORY_CARE_PROVIDER_SITE_OTHER): Payer: Medicare Other | Admitting: "Endocrinology

## 2017-01-13 VITALS — BP 129/71 | HR 78 | Ht 63.0 in | Wt 176.0 lb

## 2017-01-13 DIAGNOSIS — E890 Postprocedural hypothyroidism: Secondary | ICD-10-CM

## 2017-01-13 DIAGNOSIS — Z794 Long term (current) use of insulin: Secondary | ICD-10-CM

## 2017-01-13 DIAGNOSIS — E118 Type 2 diabetes mellitus with unspecified complications: Secondary | ICD-10-CM | POA: Diagnosis not present

## 2017-01-13 DIAGNOSIS — E1165 Type 2 diabetes mellitus with hyperglycemia: Secondary | ICD-10-CM

## 2017-01-13 DIAGNOSIS — E782 Mixed hyperlipidemia: Secondary | ICD-10-CM

## 2017-01-13 DIAGNOSIS — I10 Essential (primary) hypertension: Secondary | ICD-10-CM

## 2017-01-13 DIAGNOSIS — IMO0002 Reserved for concepts with insufficient information to code with codable children: Secondary | ICD-10-CM

## 2017-01-13 NOTE — Patient Instructions (Signed)

## 2017-01-13 NOTE — Progress Notes (Signed)
Subjective:    Patient ID: Linda Riggs, female    DOB: [birth year], PCP Dione Housekeeper, MD   Past Medical History:  Diagnosis Date  . Arthritis   . Chronic headaches   . Depression   . Diverticulosis   . Fibromyalgia   . GERD (gastroesophageal reflux disease)   . Goiter   . Hypothyroidism   . Internal hemorrhoids   . Iron deficiency anemia    Dr. Deatra Ina 11/10  . Osteoporosis   . Ruptured lumbar disc   . Type 2 diabetes mellitus ([place])    Past Surgical History:  Procedure Laterality Date  . Arthroscopic left knee surgery Bilateral   . BACK SURGERY    . CHOLECYSTECTOMY N/A 11/21/2016   Procedure: LAPAROSCOPIC CHOLECYSTECTOMY;  Surgeon: Aviva Signs, MD;  Location: [facility];  Service: General;  Laterality: N/A;  . COLONOSCOPY  2016   Dr. Deatra Ina: diverticulosis  . DILATION AND CURETTAGE OF UTERUS    . ESOPHAGOGASTRODUODENOSCOPY  2016   Dr. Deatra Ina: early GE junction stricture s/p dilation   . THYROIDECTOMY N/A 06/06/2013   Procedure: TOTAL THYROIDECTOMY;  Surgeon: Jamesetta So, MD;  Location: [facility];  Service: General;  Laterality: N/A;   Social History   Social History  . Marital status: Single    Spouse name: N/A  . Number of children: N/A  . Years of education: N/A   Social History Main Topics  . Smoking status: Current Every Day Smoker    Packs/day: 0.50    Years: 20.00    Types: Cigarettes    Last attempt to quit: 04/27/2015  . Smokeless tobacco: Never Used  . Alcohol use No  . Drug use: No  . Sexual activity: Yes    Birth control/ protection: Post-menopausal   Other Topics Concern  . None   Social History Narrative  . None   Outpatient Encounter Prescriptions as of 01/13/2017  Medication Sig  . rOPINIRole (REQUIP) 1 MG tablet Take by mouth at bedtime.  Marland Kitchen alendronate (FOSAMAX) 70 MG tablet Take 10 mg by mouth once a week.  . Aspirin-Acetaminophen-Caffeine (EXCEDRIN PO) Take 1-2 tablets by mouth every 4 (four) hours as needed (pain).  Marland Kitchen  atorvastatin (LIPITOR) 80 MG tablet Take 80 mg by mouth daily.  . Calcium Carbonate-Vit D-Min (CALCIUM 1200 PO) Take 1 capsule by mouth daily.  Marland Kitchen escitalopram (LEXAPRO) 20 MG tablet Take 20 mg by mouth daily.    Marland Kitchen esomeprazole (NEXIUM) 40 MG capsule Take 40 mg by mouth daily.   . fenofibrate 160 MG tablet Take 160 mg by mouth daily.   . fexofenadine (ALLEGRA) 180 MG tablet Take 180 mg by mouth daily.    Marland Kitchen gabapentin (NEURONTIN) 300 MG capsule Take 300 mg by mouth 3 (three) times daily.  . hydrochlorothiazide (HYDRODIURIL) 25 MG tablet Take 25 mg by mouth daily.  Marland Kitchen HYDROcodone-acetaminophen (NORCO/VICODIN) 5-325 MG tablet 1 or 2 tabs PO q6 hours prn pain  . Insulin Detemir (LEVEMIR FLEXTOUCH) 100 UNIT/ML Pen INJECT 55 UNITS SQ AT 10PM (Patient taking differently: 55 Units. INJECT 55 UNITS SQ AT 10PM)  . levothyroxine (SYNTHROID, LEVOTHROID) 125 MCG tablet TAKE ONE TABLET EVERY MORNING  . meloxicam (MOBIC) 7.5 MG tablet Take 7.5 mg by mouth 2 (two) times daily.   . metFORMIN (GLUCOPHAGE-XR) 500 MG 24 hr tablet TAKE ONE TABLET BY MOUTH TWICE DAILY  . ONETOUCH DELICA LANCETS 95K MISC 4 TIMES DAILY  . ONETOUCH VERIO test strip CHECK BLOOD 4 TIMES DAILY  .  POTASSIUM PO Take 1 tablet by mouth daily.  . promethazine (PHENERGAN) 25 MG suppository Place 1 suppository (25 mg total) rectally every 6 (six) hours as needed for nausea or vomiting.  . quinapril (ACCUPRIL) 10 MG tablet Take 10 mg by mouth daily.   Marland Kitchen ULTICARE SHORT PEN NEEDLES 31G X 8 MM MISC 4 TIMES DAILY  . [DISCONTINUED] INVOKANA 100 MG TABS tablet TAKE ONE (1) TABLET EACH DAY   No facility-administered encounter medications on file as of 01/13/2017.    ALLERGIES: Allergies  Allergen Reactions  . Aspirin Itching  . Flonase [Fluticasone Propionate] Itching  . Prednisone Itching  . Tramadol Itching   VACCINATION STATUS:  There is no immunization history on file for this patient.  Diabetes  She presents for her follow-up diabetic  visit. She has type 2 diabetes mellitus. Her disease course has been improving. Pertinent negatives for hypoglycemia include no confusion, headaches, pallor or seizures. Associated symptoms include fatigue. Pertinent negatives for diabetes include no chest pain, no polydipsia, no polyphagia and no polyuria. Symptoms are improving. There are no diabetic complications. Risk factors for coronary artery disease include diabetes mellitus, dyslipidemia, tobacco exposure, sedentary lifestyle and hypertension. Current diabetic treatment includes insulin injections and oral agent (dual therapy). She is compliant with treatment most of the time. Her weight is increasing steadily. She is following a generally unhealthy diet. When asked about meal planning, she reported none. She rarely participates in exercise. Home blood sugar record trend: She did not bring her logs. Her overall blood glucose range is 140-180 mg/dl. An ACE inhibitor/angiotensin II receptor blocker is being taken.  Thyroid Problem  Presents for follow-up visit. Symptoms include fatigue. Patient reports no cold intolerance, diarrhea, heat intolerance or palpitations. The symptoms have been stable. Past treatments include levothyroxine. Prior procedures include thyroidectomy. Her past medical history is significant for diabetes and hyperlipidemia.  Hypertension  This is a chronic problem. The current episode started more than 1 year ago. Pertinent negatives include no chest pain, headaches, palpitations or shortness of breath. Past treatments include ACE inhibitors. Identifiable causes of hypertension include a thyroid problem.  Hyperlipidemia  This is a chronic problem. The current episode started more than 1 year ago. Exacerbating diseases include diabetes. Pertinent negatives include no chest pain, myalgias or shortness of breath. Current antihyperlipidemic treatment includes statins. Risk factors for coronary artery disease include diabetes mellitus,  dyslipidemia and hypertension.     Review of Systems  Constitutional: Positive for fatigue. Negative for unexpected weight change.  HENT: Negative for trouble swallowing and voice change.   Eyes: Negative for visual disturbance.  Respiratory: Negative for cough, shortness of breath and wheezing.   Cardiovascular: Negative for chest pain, palpitations and leg swelling.  Gastrointestinal: Negative for diarrhea, nausea and vomiting.  Endocrine: Negative for cold intolerance, heat intolerance, polydipsia, polyphagia and polyuria.  Musculoskeletal: Negative for arthralgias and myalgias.  Skin: Negative for color change, pallor, rash and wound.  Neurological: Negative for seizures and headaches.  Psychiatric/Behavioral: Negative for confusion and suicidal ideas.    Objective:    BP 129/71   Pulse 78   Ht _0  (1.6 m)   Wt 176 lb (79.8 kg)   BMI 31.18 kg/m   Wt Readings from Last 3 Encounters:  01/13/17 176 lb (79.8 kg)  12/02/16 175 lb (79.4 kg)  11/21/16 161 lb (73 kg)    Physical Exam  Constitutional: She is oriented to person, place, and time. She appears well-developed.  HENT:  Head: Normocephalic and  atraumatic.  Eyes: EOM are normal.  Neck: Normal range of motion. Neck supple. No tracheal deviation present. No thyromegaly present.  Cardiovascular: Normal rate and regular rhythm.   Pulmonary/Chest: Effort normal and breath sounds normal.  Abdominal: Soft. Bowel sounds are normal. There is no tenderness. There is no guarding.  Musculoskeletal: Normal range of motion. She exhibits no edema.  Neurological: She is alert and oriented to person, place, and time. She has normal reflexes. No cranial nerve deficit. Coordination normal.  Skin: Skin is warm and dry. No rash noted. No erythema. No pallor.  Psychiatric: She has a normal mood and affect. Judgment normal.   Recent Results (from the past 2160 hour(s))  Lipase, blood     Status: None   Collection Time: 10/20/16 10:22 AM   Result Value Ref Range   Lipase 33 11 - 51 U/L  Comprehensive metabolic panel     Status: Abnormal   Collection Time: 10/20/16 10:22 AM  Result Value Ref Range   Sodium 136 135 - 145 mmol/L   Potassium 4.0 3.5 - 5.1 mmol/L   Chloride 101 101 - 111 mmol/L   CO2 23 22 - 32 mmol/L   Glucose, Bld 110 (H) 65 - 99 mg/dL   BUN 28 (H) 6 - 20 mg/dL   Creatinine, Ser 1.43 (H) 0.44 - 1.00 mg/dL   Calcium 9.7 8.9 - 10.3 mg/dL   Total Protein 8.0 6.5 - 8.1 g/dL   Albumin 4.2 3.5 - 5.0 g/dL   AST 27 15 - 41 U/L   ALT 17 14 - 54 U/L   Alkaline Phosphatase 32 (L) 38 - 126 U/L   Total Bilirubin 0.5 0.3 - 1.2 mg/dL   GFR calc non Af Amer 39 (L) >60 mL/min   GFR calc Af Amer 45 (L) >60 mL/min    Comment: (NOTE) The eGFR has been calculated using the CKD EPI equation. This calculation has not been validated in all clinical situations. eGFR's persistently <60 mL/min signify possible Chronic Kidney Disease.    Anion gap 12 5 - 15  CBC     Status: Abnormal   Collection Time: 10/20/16 10:22 AM  Result Value Ref Range   WBC 9.8 4.0 - 10.5 K/uL   RBC 4.17 3.87 - 5.11 MIL/uL   Hemoglobin 8.6 (L) 12.0 - 15.0 g/dL   HCT 28.9 (L) 36.0 - 46.0 %   MCV 69.3 (L) 78.0 - 100.0 fL   MCH 20.6 (L) 26.0 - 34.0 pg   MCHC 29.8 (L) 30.0 - 36.0 g/dL   RDW 20.2 (H) 11.5 - 15.5 %   Platelets 462 (H) 150 - 400 K/uL  Urinalysis, Routine w reflex microscopic     Status: Abnormal   Collection Time: 10/20/16 10:22 AM  Result Value Ref Range   Color, Urine STRAW (A) YELLOW   APPearance CLEAR CLEAR   Specific Gravity, Urine 1.026 1.005 - 1.030   pH 5.0 5.0 - 8.0   Glucose, UA 50 (A) NEGATIVE mg/dL   Hgb urine dipstick NEGATIVE NEGATIVE   Bilirubin Urine NEGATIVE NEGATIVE   Ketones, ur NEGATIVE NEGATIVE mg/dL   Protein, ur NEGATIVE NEGATIVE mg/dL   Nitrite NEGATIVE NEGATIVE   Leukocytes, UA NEGATIVE NEGATIVE  RBC Folate     Status: None   Collection Time: 11/05/16  1:41 PM  Result Value Ref Range   RBC Folate  1,267 >280 ng/mL    Comment: Reference range not established for pediatric patients.  B12     Status:  Abnormal   Collection Time: 11/05/16  1:41 PM  Result Value Ref Range   Vitamin B-12 1,145 (H) 200 - 1,100 pg/mL  Lipase, blood     Status: None   Collection Time: 11/14/16  2:50 PM  Result Value Ref Range   Lipase 37 11 - 51 U/L  Comprehensive metabolic panel     Status: Abnormal   Collection Time: 11/14/16  2:50 PM  Result Value Ref Range   Sodium 140 135 - 145 mmol/L   Potassium 4.7 3.5 - 5.1 mmol/L   Chloride 109 101 - 111 mmol/L   CO2 20 (L) 22 - 32 mmol/L   Glucose, Bld 111 (H) 65 - 99 mg/dL   BUN 21 (H) 6 - 20 mg/dL   Creatinine, Ser 1.18 (H) 0.44 - 1.00 mg/dL   Calcium 9.9 8.9 - 10.3 mg/dL   Total Protein 7.8 6.5 - 8.1 g/dL   Albumin 4.0 3.5 - 5.0 g/dL   AST 26 15 - 41 U/L   ALT 20 14 - 54 U/L   Alkaline Phosphatase 31 (L) 38 - 126 U/L   Total Bilirubin 0.4 0.3 - 1.2 mg/dL   GFR calc non Af Amer 49 (L) >60 mL/min   GFR calc Af Amer 56 (L) >60 mL/min    Comment: (NOTE) The eGFR has been calculated using the CKD EPI equation. This calculation has not been validated in all clinical situations. eGFR's persistently <60 mL/min signify possible Chronic Kidney Disease.    Anion gap 11 5 - 15  CBC     Status: Abnormal   Collection Time: 11/14/16  2:50 PM  Result Value Ref Range   WBC 9.2 4.0 - 10.5 K/uL   RBC 4.14 3.87 - 5.11 MIL/uL   Hemoglobin 8.8 (L) 12.0 - 15.0 g/dL   HCT 29.0 (L) 36.0 - 46.0 %   MCV 70.0 (L) 78.0 - 100.0 fL   MCH 21.3 (L) 26.0 - 34.0 pg   MCHC 30.3 30.0 - 36.0 g/dL   RDW 20.4 (H) 11.5 - 15.5 %   Platelets 431 (H) 150 - 400 K/uL  Urinalysis, Routine w reflex microscopic     Status: None   Collection Time: 11/14/16  2:50 PM  Result Value Ref Range   Color, Urine YELLOW YELLOW   APPearance CLEAR CLEAR   Specific Gravity, Urine 1.018 1.005 - 1.030   pH 5.0 5.0 - 8.0   Glucose, UA NEGATIVE NEGATIVE mg/dL   Hgb urine dipstick NEGATIVE NEGATIVE    Bilirubin Urine NEGATIVE NEGATIVE   Ketones, ur NEGATIVE NEGATIVE mg/dL   Protein, ur NEGATIVE NEGATIVE mg/dL   Nitrite NEGATIVE NEGATIVE   Leukocytes, UA NEGATIVE NEGATIVE  Differential     Status: None   Collection Time: 11/14/16  3:25 PM  Result Value Ref Range   Neutrophils Relative % 72 %   Neutro Abs 6.9 1.7 - 7.7 K/uL   Lymphocytes Relative 20 %   Lymphs Abs 1.9 0.7 - 4.0 K/uL   Monocytes Relative 5 %   Monocytes Absolute 0.5 0.1 - 1.0 K/uL   Eosinophils Relative 2 %   Eosinophils Absolute 0.2 0.0 - 0.7 K/uL   Basophils Relative 1 %   Basophils Absolute 0.1 0.0 - 0.1 K/uL  Lactic acid, plasma     Status: None   Collection Time: 11/14/16  3:45 PM  Result Value Ref Range   Lactic Acid, Venous 0.8 0.5 - 1.9 mmol/L  Lactic acid, plasma     Status: None  Collection Time: 11/14/16  6:11 PM  Result Value Ref Range   Lactic Acid, Venous 0.6 0.5 - 1.9 mmol/L  CBC WITH DIFFERENTIAL     Status: Abnormal   Collection Time: 11/19/16  2:06 PM  Result Value Ref Range   WBC 8.6 4.0 - 10.5 K/uL   RBC 4.47 3.87 - 5.11 MIL/uL   Hemoglobin 9.4 (L) 12.0 - 15.0 g/dL   HCT 31.4 (L) 36.0 - 46.0 %   MCV 70.2 (L) 78.0 - 100.0 fL   MCH 21.0 (L) 26.0 - 34.0 pg   MCHC 29.9 (L) 30.0 - 36.0 g/dL   RDW 20.3 (H) 11.5 - 15.5 %   Platelets 435 (H) 150 - 400 K/uL   Neutrophils Relative % 74 %   Neutro Abs 6.3 1.7 - 7.7 K/uL   Lymphocytes Relative 18 %   Lymphs Abs 1.6 0.7 - 4.0 K/uL   Monocytes Relative 5 %   Monocytes Absolute 0.4 0.1 - 1.0 K/uL   Eosinophils Relative 2 %   Eosinophils Absolute 0.2 0.0 - 0.7 K/uL   Basophils Relative 1 %   Basophils Absolute 0.1 0.0 - 0.1 K/uL  Prepare RBC (crossmatch)     Status: None   Collection Time: 11/19/16  2:06 PM  Result Value Ref Range   Order Confirmation ORDER PROCESSED BY BLOOD BANK   Type and screen     Status: None   Collection Time: 11/19/16  2:06 PM  Result Value Ref Range   ABO/RH(D) O POS    Antibody Screen NEG    Sample Expiration  12/03/2016    Unit Number [id number]    Blood Component Type RED CELLS,LR    Unit division 00    Status of Unit REL FROM [facility]    Transfusion Status OK TO TRANSFUSE    Crossmatch Result Compatible    Unit Number [id number]    Blood Component Type RED CELLS,LR    Unit division 00    Status of Unit REL FROM [facility]    Transfusion Status OK TO TRANSFUSE    Crossmatch Result Compatible   BPAM RBC     Status: None   Collection Time: 11/19/16  2:06 PM  Result Value Ref Range   Blood Product Unit Number [id number]    Unit Type and Rh 5100    Blood Product Expiration Date [id number]    Blood Product Unit Number [id number]    Unit Type and Rh 5100    Blood Product Expiration Date [id number]   Glucose, capillary     Status: Abnormal   Collection Time: 11/21/16 10:15 AM  Result Value Ref Range   Glucose-Capillary 154 (H) 65 - 99 mg/dL  Glucose, capillary     Status: Abnormal   Collection Time: 11/21/16 12:38 PM  Result Value Ref Range   Glucose-Capillary 199 (H) 65 - 99 mg/dL  HM DIABETES EYE EXAM     Status: None   Collection Time: 12/18/16  8:19 AM  Result Value Ref Range   HM Diabetic Eye Exam No Retinopathy No Retinopathy  Renal function panel     Status: Abnormal   Collection Time: 01/06/17 11:37 AM  Result Value Ref Range   Glucose, Bld 107 65 - 139 mg/dL    Comment: .        Non-fasting reference interval .    BUN 29 (H) 7 - 25 mg/dL   Creat 1.49 (H) 0.50 - 0.99 mg/dL    Comment: For patients >74 years of age,  the reference limit for Creatinine is approximately 13% higher for people identified as African-American. .    BUN/Creatinine Ratio 19 6 - 22 (calc)   Sodium 137 135 - 146 mmol/L   Potassium 5.0 3.5 - 5.3 mmol/L   Chloride 103 98 - 110 mmol/L   CO2 22 20 - 32 mmol/L   Calcium 9.3 8.6 - 10.4 mg/dL   Phosphorus 4.8 (H) 2.5 - 4.5 mg/dL   Albumin 4.1 3.6 - 5.1 g/dL  Hemoglobin A1c     Status: Abnormal   Collection Time: 01/06/17 11:37 AM  Result  Value Ref Range   Hgb A1c MFr Bld 7.4 (H) <5.7 % of total Hgb    Comment: For someone without known diabetes, a hemoglobin A1c value of 6.5% or greater indicates that they may have  diabetes and this should be confirmed with a follow-up  test. . For someone with known diabetes, a value <7% indicates  that their diabetes is well controlled and a value  greater than or equal to 7% indicates suboptimal  control. A1c targets should be individualized based on  duration of diabetes, age, comorbid conditions, and  other considerations. . Currently, no consensus exists regarding use of hemoglobin A1c for diagnosis of diabetes for children. .    Mean Plasma Glucose 166 (calc)   eAG (mmol/L) 9.2 (calc)  TSH     Status: None   Collection Time: 01/06/17 11:37 AM  Result Value Ref Range   TSH 0.81 0.40 - 4.50 mIU/L  T4, free     Status: None   Collection Time: 01/06/17 11:37 AM  Result Value Ref Range   Free T4 1.3 0.8 - 1.8 ng/dL    Diabetic Labs (most recent): Lab Results  Component Value Date   HGBA1C 7.4 (H) 01/06/2017   HGBA1C 7.0 (H) 09/30/2016   HGBA1C 6.9 (H) 06/17/2016     Assessment & Plan:   1. Uncontrolled type 2 diabetes mellitus with complication, with long-term current use of insulin ([place]) - patient remains at a high risk for more acute and chronic complications of diabetes which include CAD, CVA, CKD, retinopathy, and neuropathy. These are all discussed in detail with the patient.  Patient came with controlled  glucose profile,  A1c stable at 7.4% from 9.4%.   Recent labs reviewed and discussed with her.   - I have re-counseled the patient on diet management   by adopting a carbohydrate restricted / protein rich  Diet.  -  Suggestion is made for her to avoid simple carbohydrates  from her diet including Cakes, Sweet Desserts / Pastries, Ice Cream, Soda (diet and regular), Sweet Tea, Candies, Chips, Cookies, Store Bought Juices, Alcohol in Excess of  1-2 drinks a  day, Artificial Sweeteners, and "Sugar-free" Products. This will help patient to have stable blood glucose profile and potentially avoid unintended weight gain.   - Patient is advised to stick to a routine mealtimes to eat 3 meals  a day and avoid unnecessary snacks ( to snack only to correct hypoglycemia).   - I have approached patient with the following individualized plan to manage diabetes and patient agrees.  - She came with stable near target blood glucose profile.  - I will continue  Levemir 55 units daily at bedtime, associated with monitoring of blood glucose 2 times a day-daily before breakfast and at bedtime. Continue to hold prandial insulin Humalog for now.   -Patient is encouraged to call clinic for blood glucose levels less than 70 or  above 200 mg /dl. - I will continue metformin 500 mg by mouth twice a day and discontinue Invokana .  - Patient specific target  for A1c; LDL, HDL, Triglycerides, and  Waist Circumference were discussed in detail.  2) BP/HTN: Controlled. Continue current medications including ACEI/ARB.  3) Lipids/HPL:  continue statins.  4)  Postsurgical hypothyroidism  -Her thyroid function tests are consistent with appropriate replacement. She is on levothyroxine 125 g by mouth every morning.  I advised her to continue on the same dose.  - We discussed about correct intake of levothyroxine, at fasting, with water, separated by at least 30 minutes from breakfast, and separated by more than 4 hours from calcium, iron, multivitamins, acid reflux medications (PPIs). -Patient is made aware of the fact that thyroid hormone replacement is needed for life, dose to be adjusted by periodic monitoring of thyroid function tests.  5)  Weight/Diet:  Gaining weight, declines to see the dietitian, exercise, and carbohydrates information provided.  6) Chronic Care/Health Maintenance:  -Patient is on ACEI/ARB and Statin medications and encouraged to continue to follow up  with Ophthalmology, Podiatrist at least yearly or according to recommendations, and advised to quit smoking. I have recommended yearly flu vaccine and pneumonia vaccination at least every 5 years; moderate intensity exercise for up to 150 minutes weekly; and  sleep for at least 7 hours a day.  - Time spent with the patient: 25 min, of which >50% was spent in reviewing her sugar logs , discussing her hypo- and hyper-glycemic episodes, reviewing her current and  previous labs and insulin doses and developing a plan to avoid hypo- and hyper-glycemia.    - I advised patient to maintain close follow up with Dione Housekeeper, MD for primary care needs.  Follow up plan: -Return in about 3 months (around 04/15/2017) for follow up with pre-visit labs, meter, and logs.  Glade Lloyd, MD Phone: [phone]  Fax: [phone]  -  This note was partially dictated with voice recognition software. Similar sounding words can be transcribed inadequately or may not  be corrected upon review.  01/13/2017, 1:50 PM

## 2017-01-22 ENCOUNTER — Other Ambulatory Visit: Payer: Self-pay | Admitting: "Endocrinology

## 2017-04-08 LAB — HEMOGLOBIN A1C
HEMOGLOBIN A1C: 7.8 %{Hb} — AB (ref <5.7)
Mean Plasma Glucose: 177 (calc)
eAG (mmol/L): 9.8 (calc)

## 2017-04-08 LAB — COMPLETE METABOLIC PANEL WITH GFR
AG RATIO: 1.5 (calc) (ref 1.0–2.5)
ALBUMIN MSPROF: 4.1 g/dL (ref 3.6–5.1)
ALT: 16 U/L (ref 6–29)
AST: 14 U/L (ref 10–35)
Alkaline phosphatase (APISO): 40 U/L (ref 33–130)
BILIRUBIN TOTAL: 0.3 mg/dL (ref 0.2–1.2)
BUN / CREAT RATIO: 18 (calc) (ref 6–22)
BUN: 24 mg/dL (ref 7–25)
CHLORIDE: 104 mmol/L (ref 98–110)
CO2: 22 mmol/L (ref 20–32)
Calcium: 9.1 mg/dL (ref 8.6–10.4)
Creat: 1.32 mg/dL — ABNORMAL HIGH (ref 0.50–0.99)
GFR, EST AFRICAN AMERICAN: 50 mL/min/{1.73_m2} — AB (ref >=60)
GFR, Est Non African American: 43 mL/min/{1.73_m2} — ABNORMAL LOW (ref >=60)
GLOBULIN: 2.7 g/dL (ref 1.9–3.7)
Glucose, Bld: 183 mg/dL — ABNORMAL HIGH (ref 65–139)
POTASSIUM: 4.4 mmol/L (ref 3.5–5.3)
Sodium: 137 mmol/L (ref 135–146)
TOTAL PROTEIN: 6.8 g/dL (ref 6.1–8.1)

## 2017-04-16 ENCOUNTER — Encounter: Payer: Self-pay | Admitting: "Endocrinology

## 2017-04-16 ENCOUNTER — Other Ambulatory Visit: Payer: Self-pay

## 2017-04-16 ENCOUNTER — Ambulatory Visit: Payer: Medicare Other | Admitting: "Endocrinology

## 2017-04-16 VITALS — BP 142/78 | HR 77 | Ht 63.0 in | Wt 176.0 lb

## 2017-04-16 DIAGNOSIS — Z794 Long term (current) use of insulin: Secondary | ICD-10-CM | POA: Diagnosis not present

## 2017-04-16 DIAGNOSIS — E118 Type 2 diabetes mellitus with unspecified complications: Secondary | ICD-10-CM

## 2017-04-16 DIAGNOSIS — E890 Postprocedural hypothyroidism: Secondary | ICD-10-CM | POA: Diagnosis not present

## 2017-04-16 DIAGNOSIS — E782 Mixed hyperlipidemia: Secondary | ICD-10-CM | POA: Diagnosis not present

## 2017-04-16 DIAGNOSIS — I10 Essential (primary) hypertension: Secondary | ICD-10-CM | POA: Diagnosis not present

## 2017-04-16 DIAGNOSIS — E1165 Type 2 diabetes mellitus with hyperglycemia: Secondary | ICD-10-CM

## 2017-04-16 DIAGNOSIS — IMO0002 Reserved for concepts with insufficient information to code with codable children: Secondary | ICD-10-CM

## 2017-04-16 MED ORDER — INSULIN DEGLUDEC 100 UNIT/ML ~~LOC~~ SOPN: 60.0000 [IU] | PEN_INJECTOR | Freq: Every day | SUBCUTANEOUS | 2 refills | Status: DC

## 2017-04-16 MED ORDER — INSULIN DEGLUDEC 100 UNIT/ML ~~LOC~~ SOPN: 60.0000 [IU] | PEN_INJECTOR | Freq: Every day | SUBCUTANEOUS | 0 refills | Status: DC

## 2017-04-16 NOTE — Progress Notes (Signed)
Subjective:    Patient ID: Linda Riggs, female    DOB: 04/07/1955, PCP Joette CatchingNyland, Leonard, MD   Past Medical History:  Diagnosis Date  . Arthritis   . Chronic headaches   . Depression   . Diverticulosis   . Fibromyalgia   . GERD (gastroesophageal reflux disease)   . Goiter   . Hypothyroidism   . Internal hemorrhoids   . Iron deficiency anemia    Dr. Arlyce DiceKaplan 11/10  . Osteoporosis   . Ruptured lumbar disc   . Type 2 diabetes mellitus (HCC)    Past Surgical History:  Procedure Laterality Date  . Arthroscopic left knee surgery Bilateral   . BACK SURGERY    . CHOLECYSTECTOMY N/A 11/21/2016   Procedure: LAPAROSCOPIC CHOLECYSTECTOMY;  Surgeon: Franky MachoJenkins, Mark, MD;  Location: [facility];  Service: General;  Laterality: N/A;  . COLONOSCOPY  2016   Dr. Arlyce DiceKaplan: diverticulosis  . DILATION AND CURETTAGE OF UTERUS    . ESOPHAGOGASTRODUODENOSCOPY  2016   Dr. Arlyce DiceKaplan: early GE junction stricture s/p dilation   . THYROIDECTOMY N/A 06/06/2013   Procedure: TOTAL THYROIDECTOMY;  Surgeon: Dalia HeadingMark A Jenkins, MD;  Location: [facility];  Service: General;  Laterality: N/A;   Social History   Socioeconomic History  . Marital status: Single    Spouse name: None  . Number of children: None  . Years of education: None  . Highest education level: None  Social Needs  . Financial resource strain: None  . Food insecurity - worry: None  . Food insecurity - inability: None  . Transportation needs - medical: None  . Transportation needs - non-medical: None  Occupational History  . None  Tobacco Use  . Smoking status: Current Every Day Smoker    Packs/day: 0.50    Years: 20.00    Pack years: 10.00    Types: Cigarettes    Last attempt to quit: 04/27/2015    Years since quitting: 1.9  . Smokeless tobacco: Never Used  Substance and Sexual Activity  . Alcohol use: No    Alcohol/week: 0.0 oz  . Drug use: No  . Sexual activity: Yes    Birth control/protection: Post-menopausal  Other Topics Concern  .  None  Social History Narrative  . None   Outpatient Encounter Medications as of 04/16/2017  Medication Sig  . alendronate (FOSAMAX) 70 MG tablet Take 10 mg by mouth once a week.  . Aspirin-Acetaminophen-Caffeine (EXCEDRIN PO) Take 1-2 tablets by mouth every 4 (four) hours as needed (pain).  Marland Kitchen. atorvastatin (LIPITOR) 80 MG tablet Take 80 mg by mouth daily.  . Calcium Carbonate-Vit D-Min (CALCIUM 1200 PO) Take 1 capsule by mouth daily.  Marland Kitchen. escitalopram (LEXAPRO) 20 MG tablet Take 20 mg by mouth daily.    Marland Kitchen. esomeprazole (NEXIUM) 40 MG capsule Take 40 mg by mouth daily.   . fenofibrate 160 MG tablet Take 160 mg by mouth daily.   . fexofenadine (ALLEGRA) 180 MG tablet Take 180 mg by mouth daily.    Marland Kitchen. gabapentin (NEURONTIN) 300 MG capsule Take 300 mg by mouth 3 (three) times daily.  . hydrochlorothiazide (HYDRODIURIL) 25 MG tablet Take 25 mg by mouth daily.  Marland Kitchen. HYDROcodone-acetaminophen (NORCO/VICODIN) 5-325 MG tablet 1 or 2 tabs PO q6 hours prn pain  . levothyroxine (SYNTHROID, LEVOTHROID) 125 MCG tablet TAKE ONE TABLET EVERY MORNING  . meloxicam (MOBIC) 7.5 MG tablet Take 7.5 mg by mouth 2 (two) times daily.   . metFORMIN (GLUCOPHAGE-XR) 500 MG 24 hr tablet TAKE  ONE TABLET BY MOUTH TWICE DAILY  . ONETOUCH DELICA LANCETS 33G MISC 4 TIMES DAILY  . ONETOUCH VERIO test strip CHECK BLOOD 4 TIMES DAILY  . POTASSIUM PO Take 1 tablet by mouth daily.  . promethazine (PHENERGAN) 25 MG suppository Place 1 suppository (25 mg total) rectally every 6 (six) hours as needed for nausea or vomiting.  . quinapril (ACCUPRIL) 10 MG tablet Take 10 mg by mouth daily.   Marland Kitchen rOPINIRole (REQUIP) 1 MG tablet Take by mouth at bedtime.  Marland Kitchen ULTICARE SHORT PEN NEEDLES 31G X 8 MM MISC 4 TIMES DAILY  . [DISCONTINUED] insulin degludec (TRESIBA FLEXTOUCH) 100 UNIT/ML SOPN FlexTouch Pen Inject 0.6 mLs (60 Units total) into the skin daily at 10 pm.  . [DISCONTINUED] LEVEMIR FLEXTOUCH 100 UNIT/ML Pen INJECT 55 UNITS SQ AT 10PM   No  facility-administered encounter medications on file as of 04/16/2017.    ALLERGIES: Allergies  Allergen Reactions  . Aspirin Itching  . Flonase [Fluticasone Propionate] Itching  . Prednisone Itching  . Tramadol Itching   VACCINATION STATUS:  There is no immunization history on file for this patient.  Diabetes  She presents for her follow-up diabetic visit. She has type 2 diabetes mellitus. Her disease course has been worsening. Pertinent negatives for hypoglycemia include no confusion, headaches, pallor or seizures. Associated symptoms include fatigue. Pertinent negatives for diabetes include no chest pain, no polydipsia, no polyphagia and no polyuria. Symptoms are worsening. There are no diabetic complications. Risk factors for coronary artery disease include diabetes mellitus, dyslipidemia, tobacco exposure, sedentary lifestyle and hypertension. Current diabetic treatment includes insulin injections and oral agent (dual therapy). She is compliant with treatment most of the time. Her weight is stable. She is following a generally unhealthy diet. When asked about meal planning, she reported none. She rarely participates in exercise. Home blood sugar record trend: She did not bring her logs. Her breakfast blood glucose range is generally 140-180 mg/dl. Her bedtime blood glucose range is generally 180-200 mg/dl. Her overall blood glucose range is 140-180 mg/dl. An ACE inhibitor/angiotensin II receptor blocker is being taken.  Thyroid Problem  Presents for follow-up visit. Symptoms include fatigue. Patient reports no cold intolerance, diarrhea, heat intolerance or palpitations. The symptoms have been stable. Past treatments include levothyroxine. Prior procedures include thyroidectomy. Her past medical history is significant for diabetes and hyperlipidemia.  Hypertension  This is a chronic problem. The current episode started more than 1 year ago. Pertinent negatives include no chest pain, headaches,  palpitations or shortness of breath. Risk factors for coronary artery disease include dyslipidemia, diabetes mellitus and smoking/tobacco exposure. Past treatments include ACE inhibitors. Identifiable causes of hypertension include a thyroid problem.  Hyperlipidemia  This is a chronic problem. The current episode started more than 1 year ago. Exacerbating diseases include diabetes. Pertinent negatives include no chest pain, myalgias or shortness of breath. Current antihyperlipidemic treatment includes statins. Risk factors for coronary artery disease include diabetes mellitus, dyslipidemia and hypertension.     Review of Systems  Constitutional: Positive for fatigue. Negative for unexpected weight change.  HENT: Negative for trouble swallowing and voice change.   Eyes: Negative for visual disturbance.  Respiratory: Negative for cough, shortness of breath and wheezing.   Cardiovascular: Negative for chest pain, palpitations and leg swelling.  Gastrointestinal: Negative for diarrhea, nausea and vomiting.  Endocrine: Negative for cold intolerance, heat intolerance, polydipsia, polyphagia and polyuria.  Musculoskeletal: Negative for arthralgias and myalgias.  Skin: Negative for color change, pallor, rash and wound.  Neurological: Negative for seizures and headaches.  Psychiatric/Behavioral: Negative for confusion and suicidal ideas.    Objective:    BP (!) 142/78   Pulse 77   Ht 5\' 3"  (1.6 m)   Wt 176 lb (79.8 kg)   BMI 31.18 kg/m   Wt Readings from Last 3 Encounters:  04/16/17 176 lb (79.8 kg)  01/13/17 176 lb (79.8 kg)  12/02/16 175 lb (79.4 kg)    Physical Exam  Constitutional: She is oriented to person, place, and time. She appears well-developed.  HENT:  Head: Normocephalic and atraumatic.  Eyes: EOM are normal.  Neck: Normal range of motion. Neck supple. No tracheal deviation present. No thyromegaly present.  Cardiovascular: Normal rate and regular rhythm.  Pulmonary/Chest:  Effort normal and breath sounds normal.  Abdominal: Soft. Bowel sounds are normal. There is no tenderness. There is no guarding.  Musculoskeletal: Normal range of motion. She exhibits no edema.  Neurological: She is alert and oriented to person, place, and time. She has normal reflexes. No cranial nerve deficit. Coordination normal.  Skin: Skin is warm and dry. No rash noted. No erythema. No pallor.  Psychiatric: She has a normal mood and affect. Judgment normal.   Recent Results (from the past 2160 hour(s))  COMPLETE METABOLIC PANEL WITH GFR     Status: Abnormal   Collection Time: 04/07/17 10:13 AM  Result Value Ref Range   Glucose, Bld 183 (H) 65 - 139 mg/dL    Comment: .        Non-fasting reference interval .    BUN 24 7 - 25 mg/dL   Creat 1.61 (H) 0.96 - 0.99 mg/dL    Comment: For patients >41 years of age, the reference limit for Creatinine is approximately 13% higher for people identified as African-American. .    GFR, Est Non African American 43 (L) > OR = 60 mL/min/1.66m2   GFR, Est African American 50 (L) > OR = 60 mL/min/1.11m2   BUN/Creatinine Ratio 18 6 - 22 (calc)   Sodium 137 135 - 146 mmol/L   Potassium 4.4 3.5 - 5.3 mmol/L   Chloride 104 98 - 110 mmol/L   CO2 22 20 - 32 mmol/L   Calcium 9.1 8.6 - 10.4 mg/dL   Total Protein 6.8 6.1 - 8.1 g/dL   Albumin 4.1 3.6 - 5.1 g/dL   Globulin 2.7 1.9 - 3.7 g/dL (calc)   AG Ratio 1.5 1.0 - 2.5 (calc)   Total Bilirubin 0.3 0.2 - 1.2 mg/dL   Alkaline phosphatase (APISO) 40 33 - 130 U/L   AST 14 10 - 35 U/L   ALT 16 6 - 29 U/L  Hemoglobin A1c     Status: Abnormal   Collection Time: 04/07/17 10:13 AM  Result Value Ref Range   Hgb A1c MFr Bld 7.8 (H) <5.7 % of total Hgb    Comment: For someone without known diabetes, a hemoglobin A1c value of 6.5% or greater indicates that they may have  diabetes and this should be confirmed with a follow-up  test. . For someone with known diabetes, a value <7% indicates  that their  diabetes is well controlled and a value  greater than or equal to 7% indicates suboptimal  control. A1c targets should be individualized based on  duration of diabetes, age, comorbid conditions, and  other considerations. . Currently, no consensus exists regarding use of hemoglobin A1c for diagnosis of diabetes for children. .    Mean Plasma Glucose 177 (calc)  eAG (mmol/L) 9.8 (calc)    Diabetic Labs (most recent): Lab Results  Component Value Date   HGBA1C 7.8 (H) 04/07/2017   HGBA1C 7.4 (H) 01/06/2017   HGBA1C 7.0 (H) 09/30/2016   Lipid Panel     Component Value Date/Time   CHOL 192 03/13/2016 1015   TRIG 164 (H) 03/13/2016 1015   HDL 56 03/13/2016 1015   CHOLHDL 3.4 03/13/2016 1015   VLDL 33 (H) 03/13/2016 1015   LDLCALC 103 (H) 03/13/2016 1015    Assessment & Plan:   1. Uncontrolled type 2 diabetes mellitus with complication, with long-term current use of insulin (HCC)  - patient remains at a high risk for more acute and chronic complications of diabetes which include CAD, CVA, CKD, retinopathy, and neuropathy. These are all discussed in detail with the patient.  Patient came with controlled  fasting but above target postprandial blood glucose profile. A1c is up to 7.8% from 7.4%, after generally improving from recent A1c of 9.4%.   Recent labs reviewed and discussed with her.   - I have re-counseled the patient on diet management   by adopting a carbohydrate restricted / protein rich  Diet.  -  Suggestion is made for her to avoid simple carbohydrates  from her diet including Cakes, Sweet Desserts / Pastries, Ice Cream, Soda (diet and regular), Sweet Tea, Candies, Chips, Cookies, Store Bought Juices, Alcohol in Excess of  1-2 drinks a day, Artificial Sweeteners, and "Sugar-free" Products. This will help patient to have stable blood glucose profile and potentially avoid unintended weight gain.  - Patient is advised to stick to a routine mealtimes to eat 3 meals  a  day and avoid unnecessary snacks ( to snack only to correct hypoglycemia).   - I have approached patient with the following individualized plan to manage diabetes and patient agrees.  - She came with stable near target fasting blood glucose profile, but above target postprandial blood glucose profile. Her A1c is 7.8%.  - I will  switch her basal insulin from Levemir to [place] and increased to 60 units daily at bedtime, associated with monitoring of blood glucose 2 times a day-daily before breakfast and at bedtime. Continue to hold prandial insulin Humalog for now.   -Patient is encouraged to call clinic for blood glucose levels less than 70 or above 200 mg /dl. - I will continue metformin 500 mg by mouth twice a day and discontinue Invokana .  - Patient specific target  for A1c; LDL, HDL, Triglycerides, and  Waist Circumference were discussed in detail.  2) BP/HTN:   her blood pressure is not controlled to target. I advised her to continue her current blood pressure medications including ACEI/ARB.  3) Lipids/HPL: Uncontrolled with LDL 103, she will have repeat fasting lipid panel before her next visit. I advised her to continue statins.  4)  Postsurgical hypothyroidism  -  Heart thyroid function tests are consistent with appropriate replacement. - I advised her to continue  levothyroxine 125 g by mouth every morning.    - We discussed about correct intake of levothyroxine, at fasting, with water, separated by at least 30 minutes from breakfast, and separated by more than 4 hours from calcium, iron, multivitamins, acid reflux medications (PPIs). -Patient is made aware of the fact that thyroid hormone replacement is needed for life, dose to be adjusted by periodic monitoring of thyroid function tests.  5)  Weight/Diet:   she has a steady weight lately,  declines to see the dietitian,  exercise, and carbohydrates information provided.  6) Chronic Care/Health Maintenance:  -Patient is on  ACEI/ARB and Statin medications and encouraged to continue to follow up with Ophthalmology, Podiatrist at least yearly or according to recommendations, and advised to quit smoking. I have recommended yearly flu vaccine and pneumonia vaccination at least every 5 years; moderate intensity exercise for up to 150 minutes weekly; and  sleep for at least 7 hours a day.  - Time spent with the patient: 25 min, of which >50% was spent in reviewing her blood glucose logs , discussing her hypo- and hyper-glycemic episodes, reviewing her current and  previous labs and insulin doses and developing a plan to avoid hypo- and hyper-glycemia. Please refer to Patient Instructions for Blood Glucose Monitoring and Insulin/Medications Dosing Guide"  in media tab for additional information.  - I advised patient to maintain close follow up with Joette Catching, MD for primary care needs.  Follow up plan: -Return in about 3 months (around 07/15/2017) for meter, and logs.  Marquis Lunch, MD Phone: [phone]  Fax: [phone]  -  This note was partially dictated with voice recognition software. Similar sounding words can be transcribed inadequately or may not  be corrected upon review.  04/16/2017, 11:16 AM

## 2017-04-16 NOTE — Patient Instructions (Signed)

## 2017-04-28 ENCOUNTER — Other Ambulatory Visit: Payer: Self-pay | Admitting: "Endocrinology

## 2017-07-08 LAB — COMPLETE METABOLIC PANEL WITH GFR
AG Ratio: 1.9 (calc) (ref 1.0–2.5)
ALBUMIN MSPROF: 4.3 g/dL (ref 3.6–5.1)
ALT: 15 U/L (ref 6–29)
AST: 15 U/L (ref 10–35)
Alkaline phosphatase (APISO): 42 U/L (ref 33–130)
BILIRUBIN TOTAL: 0.3 mg/dL (ref 0.2–1.2)
BUN / CREAT RATIO: 21 (calc) (ref 6–22)
BUN: 26 mg/dL — AB (ref 7–25)
CALCIUM: 9.4 mg/dL (ref 8.6–10.4)
CHLORIDE: 106 mmol/L (ref 98–110)
CO2: 25 mmol/L (ref 20–32)
Creat: 1.24 mg/dL — ABNORMAL HIGH (ref 0.50–0.99)
GFR, EST AFRICAN AMERICAN: 54 mL/min/{1.73_m2} — AB (ref >=60)
GFR, EST NON AFRICAN AMERICAN: 47 mL/min/{1.73_m2} — AB (ref >=60)
GLUCOSE: 91 mg/dL (ref 65–99)
Globulin: 2.3 g/dL (calc) (ref 1.9–3.7)
Potassium: 4.4 mmol/L (ref 3.5–5.3)
Sodium: 139 mmol/L (ref 135–146)
TOTAL PROTEIN: 6.6 g/dL (ref 6.1–8.1)

## 2017-07-08 LAB — LIPID PANEL
Cholesterol: 184 mg/dL (ref <200)
HDL: 42 mg/dL — ABNORMAL LOW (ref >50)
LDL CHOLESTEROL (CALC): 111 mg/dL — AB
NON-HDL CHOLESTEROL (CALC): 142 mg/dL — AB (ref <130)
TRIGLYCERIDES: 191 mg/dL — AB (ref <150)
Total CHOL/HDL Ratio: 4.4 (calc) (ref <5.0)

## 2017-07-08 LAB — VITAMIN D 25 HYDROXY (VIT D DEFICIENCY, FRACTURES): VIT D 25 HYDROXY: 28 ng/mL — AB (ref 30–100)

## 2017-07-08 LAB — MICROALBUMIN / CREATININE URINE RATIO
CREATININE, URINE: 121 mg/dL (ref 20–275)
MICROALB UR: 3 mg/dL
Microalb Creat Ratio: 25 mcg/mg creat (ref <30)

## 2017-07-08 LAB — HEMOGLOBIN A1C
HEMOGLOBIN A1C: 6.7 %{Hb} — AB (ref <5.7)
Mean Plasma Glucose: 146 (calc)
eAG (mmol/L): 8.1 (calc)

## 2017-07-10 ENCOUNTER — Other Ambulatory Visit: Payer: Self-pay | Admitting: "Endocrinology

## 2017-07-16 ENCOUNTER — Encounter: Payer: Self-pay | Admitting: "Endocrinology

## 2017-07-16 ENCOUNTER — Ambulatory Visit: Payer: Medicare Other | Admitting: "Endocrinology

## 2017-07-16 VITALS — BP 129/74 | HR 80 | Ht 63.0 in | Wt 178.0 lb

## 2017-07-16 DIAGNOSIS — E118 Type 2 diabetes mellitus with unspecified complications: Secondary | ICD-10-CM

## 2017-07-16 DIAGNOSIS — E1165 Type 2 diabetes mellitus with hyperglycemia: Secondary | ICD-10-CM | POA: Diagnosis not present

## 2017-07-16 DIAGNOSIS — E890 Postprocedural hypothyroidism: Secondary | ICD-10-CM

## 2017-07-16 DIAGNOSIS — E782 Mixed hyperlipidemia: Secondary | ICD-10-CM | POA: Diagnosis not present

## 2017-07-16 DIAGNOSIS — Z794 Long term (current) use of insulin: Secondary | ICD-10-CM

## 2017-07-16 DIAGNOSIS — I10 Essential (primary) hypertension: Secondary | ICD-10-CM

## 2017-07-16 DIAGNOSIS — IMO0002 Reserved for concepts with insufficient information to code with codable children: Secondary | ICD-10-CM

## 2017-07-16 NOTE — Patient Instructions (Signed)

## 2017-07-16 NOTE — Progress Notes (Signed)
Subjective:    Patient ID: Linda Riggs, female    DOB: 05-22-1955, PCP Joette Catching, MD   Past Medical History:  Diagnosis Date  . Arthritis   . Chronic headaches   . Depression   . Diverticulosis   . Fibromyalgia   . GERD (gastroesophageal reflux disease)   . Goiter   . Hypothyroidism   . Internal hemorrhoids   . Iron deficiency anemia    Dr. Arlyce Dice 11/10  . Osteoporosis   . Ruptured lumbar disc   . Type 2 diabetes mellitus (HCC)    Past Surgical History:  Procedure Laterality Date  . Arthroscopic left knee surgery Bilateral   . BACK SURGERY    . CHOLECYSTECTOMY N/A 11/21/2016   Procedure: LAPAROSCOPIC CHOLECYSTECTOMY;  Surgeon: Franky Macho, MD;  Location: [facility];  Service: General;  Laterality: N/A;  . COLONOSCOPY  2016   Dr. Arlyce Dice: diverticulosis  . DILATION AND CURETTAGE OF UTERUS    . ESOPHAGOGASTRODUODENOSCOPY  2016   Dr. Arlyce Dice: early GE junction stricture s/p dilation   . THYROIDECTOMY N/A 06/06/2013   Procedure: TOTAL THYROIDECTOMY;  Surgeon: Dalia Heading, MD;  Location: [facility];  Service: General;  Laterality: N/A;   Social History   Socioeconomic History  . Marital status: Single    Spouse name: Not on file  . Number of children: Not on file  . Years of education: Not on file  . Highest education level: Not on file  Occupational History  . Not on file  Social Needs  . Financial resource strain: Not on file  . Food insecurity:    Worry: Not on file    Inability: Not on file  . Transportation needs:    Medical: Not on file    Non-medical: Not on file  Tobacco Use  . Smoking status: Current Every Day Smoker    Packs/day: 0.50    Years: 20.00    Pack years: 10.00    Types: Cigarettes    Last attempt to quit: 04/27/2015    Years since quitting: 2.2  . Smokeless tobacco: Never Used  Substance and Sexual Activity  . Alcohol use: No    Alcohol/week: 0.0 oz  . Drug use: No  . Sexual activity: Yes    Birth control/protection:  Post-menopausal  Lifestyle  . Physical activity:    Days per week: Not on file    Minutes per session: Not on file  . Stress: Not on file  Relationships  . Social connections:    Talks on phone: Not on file    Gets together: Not on file    Attends religious service: Not on file    Active member of club or organization: Not on file    Attends meetings of clubs or organizations: Not on file    Relationship status: Not on file  Other Topics Concern  . Not on file  Social History Narrative  . Not on file   Outpatient Encounter Medications as of 07/16/2017  Medication Sig  . alendronate (FOSAMAX) 70 MG tablet Take 10 mg by mouth once a week.  . Aspirin-Acetaminophen-Caffeine (EXCEDRIN PO) Take 1-2 tablets by mouth every 4 (four) hours as needed (pain).  Marland Kitchen atorvastatin (LIPITOR) 80 MG tablet Take 80 mg by mouth daily.  . Calcium Carbonate-Vit D-Min (CALCIUM 1200 PO) Take 1 capsule by mouth daily.  Marland Kitchen escitalopram (LEXAPRO) 20 MG tablet Take 20 mg by mouth daily.    Marland Kitchen esomeprazole (NEXIUM) 40 MG capsule Take 40  mg by mouth daily.   . fenofibrate 160 MG tablet Take 160 mg by mouth daily.   . fexofenadine (ALLEGRA) 180 MG tablet Take 180 mg by mouth daily.    Marland Kitchen. gabapentin (NEURONTIN) 300 MG capsule Take 300 mg by mouth 3 (three) times daily.  . hydrochlorothiazide (HYDRODIURIL) 25 MG tablet Take 25 mg by mouth daily.  Marland Kitchen. HYDROcodone-acetaminophen (NORCO/VICODIN) 5-325 MG tablet 1 or 2 tabs PO q6 hours prn pain  . levothyroxine (SYNTHROID, LEVOTHROID) 125 MCG tablet TAKE ONE TABLET EVERY MORNING  . meloxicam (MOBIC) 7.5 MG tablet Take 7.5 mg by mouth 2 (two) times daily.   . metFORMIN (GLUCOPHAGE-XR) 500 MG 24 hr tablet TAKE ONE TABLET BY MOUTH TWICE DAILY  . ONETOUCH DELICA LANCETS 33G MISC 4 TIMES DAILY  . ONETOUCH VERIO test strip CHECK BLOOD 4 TIMES DAILY  . POTASSIUM PO Take 1 tablet by mouth daily.  . promethazine (PHENERGAN) 25 MG suppository Place 1 suppository (25 mg total) rectally  every 6 (six) hours as needed for nausea or vomiting.  . quinapril (ACCUPRIL) 10 MG tablet Take 10 mg by mouth daily.   Marland Kitchen. rOPINIRole (REQUIP) 1 MG tablet Take by mouth at bedtime.  . TRESIBA FLEXTOUCH 100 UNIT/ML SOPN FlexTouch Pen INJECT 0.6ML SQ AT 10PM  . ULTICARE SHORT PEN NEEDLES 31G X 8 MM MISC 4 TIMES DAILY   No facility-administered encounter medications on file as of 07/16/2017.    ALLERGIES: Allergies  Allergen Reactions  . Aspirin Itching  . Flonase [Fluticasone Propionate] Itching  . Prednisone Itching  . Tramadol Itching   VACCINATION STATUS:  There is no immunization history on file for this patient.  Diabetes  She presents for her follow-up diabetic visit. She has type 2 diabetes mellitus. Her disease course has been improving. Pertinent negatives for hypoglycemia include no confusion, headaches, pallor or seizures. Associated symptoms include fatigue. Pertinent negatives for diabetes include no chest pain, no polydipsia, no polyphagia and no polyuria. Symptoms are improving. There are no diabetic complications. Risk factors for coronary artery disease include diabetes mellitus, dyslipidemia, tobacco exposure, sedentary lifestyle and hypertension. Current diabetic treatment includes insulin injections and oral agent (dual therapy). She is compliant with treatment most of the time. Her weight is stable. She is following a generally unhealthy diet. When asked about meal planning, she reported none. She rarely participates in exercise. Her breakfast blood glucose range is generally 140-180 mg/dl. Her bedtime blood glucose range is generally 140-180 mg/dl. Her overall blood glucose range is 140-180 mg/dl. An ACE inhibitor/angiotensin II receptor blocker is being taken.  Thyroid Problem  Presents for follow-up (She has postsurgical hypothyroidism.  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 Symptoms include fatigue. Patient reports  no cold intolerance, diarrhea, heat intolerance or palpitations. The symptoms have been stable. Past treatments include levothyroxine. Prior procedures include thyroidectomy. Her past medical history is significant for diabetes and hyperlipidemia.  Hypertension  This is a chronic problem. The current episode started more than 1 year ago. The problem is controlled. Pertinent negatives include no chest pain, headaches, palpitations or shortness of breath. Risk factors for coronary artery disease include dyslipidemia, diabetes mellitus, smoking/tobacco exposure, sedentary lifestyle and post-menopausal state. Past treatments include ACE inhibitors. Identifiable causes of hypertension include a thyroid problem.  Hyperlipidemia  This is a chronic problem. The current episode started more than 1 year ago. Recent lipid tests were reviewed and are high. Exacerbating diseases include diabetes. Pertinent negatives include no chest  pain, myalgias or shortness of breath. Current antihyperlipidemic treatment includes statins. Risk factors for coronary artery disease include diabetes mellitus, dyslipidemia, hypertension, a sedentary lifestyle and post-menopausal.     Review of Systems  Constitutional: Positive for fatigue. Negative for unexpected weight change.  HENT: Negative for trouble swallowing and voice change.   Eyes: Negative for visual disturbance.  Respiratory: Negative for cough, shortness of breath and wheezing.   Cardiovascular: Negative for chest pain, palpitations and leg swelling.  Gastrointestinal: Negative for diarrhea, nausea and vomiting.  Endocrine: Negative for cold intolerance, heat intolerance, polydipsia, polyphagia and polyuria.  Musculoskeletal: Negative for arthralgias and myalgias.  Skin: Negative for color change, pallor, rash and wound.  Neurological: Negative for seizures and headaches.  Psychiatric/Behavioral: Negative for confusion and suicidal ideas.    Objective:    BP  129/74   Pulse 80   Ht 5\' 3"  (1.6 m)   Wt 178 lb (80.7 kg)   BMI 31.53 kg/m   Wt Readings from Last 3 Encounters:  07/16/17 178 lb (80.7 kg)  04/16/17 176 lb (79.8 kg)  01/13/17 176 lb (79.8 kg)    Physical Exam  Constitutional: She is oriented to person, place, and time. She appears well-developed.  HENT:  Head: Normocephalic and atraumatic.  Eyes: EOM are normal.  Neck: Normal range of motion. Neck supple. No tracheal deviation present. No thyromegaly present.  Cardiovascular: Normal rate.  Pulmonary/Chest: Effort normal.  Abdominal: Bowel sounds are normal. There is no tenderness. There is no guarding.  Musculoskeletal: Normal range of motion. She exhibits no edema.  Neurological: She is alert and oriented to person, place, and time. She has normal reflexes. No cranial nerve deficit. Coordination normal.  Skin: Skin is warm and dry. No rash noted. No erythema. No pallor.  Psychiatric: She has a normal mood and affect. Judgment normal.   Recent Results (from the past 2160 hour(s))  COMPLETE METABOLIC PANEL WITH GFR     Status: Abnormal   Collection Time: 07/07/17  9:28 AM  Result Value Ref Range   Glucose, Bld 91 65 - 99 mg/dL    Comment: .            Fasting reference interval .    BUN 26 (H) 7 - 25 mg/dL   Creat 4.09 (H) 8.11 - 0.99 mg/dL    Comment: For patients >39 years of age, the reference limit for Creatinine is approximately 13% higher for people identified as African-American. .    GFR, Est Non African American 47 (L) > OR = 60 mL/min/1.15m2   GFR, Est African American 54 (L) > OR = 60 mL/min/1.9m2   BUN/Creatinine Ratio 21 6 - 22 (calc)   Sodium 139 135 - 146 mmol/L   Potassium 4.4 3.5 - 5.3 mmol/L   Chloride 106 98 - 110 mmol/L   CO2 25 20 - 32 mmol/L   Calcium 9.4 8.6 - 10.4 mg/dL   Total Protein 6.6 6.1 - 8.1 g/dL   Albumin 4.3 3.6 - 5.1 g/dL   Globulin 2.3 1.9 - 3.7 g/dL (calc)   AG Ratio 1.9 1.0 - 2.5 (calc)   Total Bilirubin 0.3 0.2 - 1.2 mg/dL    Alkaline phosphatase (APISO) 42 33 - 130 U/L   AST 15 10 - 35 U/L   ALT 15 6 - 29 U/L  Hemoglobin A1c     Status: Abnormal   Collection Time: 07/07/17  9:28 AM  Result Value Ref Range   Hgb A1c MFr Bld 6.7 (  H) <5.7 % of total Hgb    Comment: For someone without known diabetes, a hemoglobin A1c value of 6.5% or greater indicates that they may have  diabetes and this should be confirmed with a follow-up  test. . For someone with known diabetes, a value <7% indicates  that their diabetes is well controlled and a value  greater than or equal to 7% indicates suboptimal  control. A1c targets should be individualized based on  duration of diabetes, age, comorbid conditions, and  other considerations. . Currently, no consensus exists regarding use of hemoglobin A1c for diagnosis of diabetes for children. .    Mean Plasma Glucose 146 (calc)   eAG (mmol/L) 8.1 (calc)  Lipid panel     Status: Abnormal   Collection Time: 07/07/17  9:28 AM  Result Value Ref Range   Cholesterol 184 <200 mg/dL   HDL 42 (L) >47 mg/dL   Triglycerides 829 (H) <150 mg/dL   LDL Cholesterol (Calc) 111 (H) mg/dL (calc)    Comment: Reference range: <100 . Desirable range <100 mg/dL for primary prevention;   <70 mg/dL for patients with CHD or diabetic patients  with > or = 2 CHD risk factors. Marland Kitchen LDL-C is now calculated using the Martin-Hopkins  calculation, which is a validated novel method providing  better accuracy than the Friedewald equation in the  estimation of LDL-C.  Horald Pollen et al. Lenox Ahr. 5621;308(65): 2061-2068  (http://education.[facility].com/faq/FAQ164)    Total CHOL/HDL Ratio 4.4 <5.0 (calc)   Non-HDL Cholesterol (Calc) 142 (H) <130 mg/dL (calc)    Comment: For patients with diabetes plus 1 major ASCVD risk  factor, treating to a non-HDL-C goal of <100 mg/dL  (LDL-C of <78 mg/dL) is considered a therapeutic  option.   VITAMIN D 25 Hydroxy (Vit-D Deficiency, Fractures)     Status:  Abnormal   Collection Time: 07/07/17  9:28 AM  Result Value Ref Range   Vit D, 25-Hydroxy 28 (L) 30 - 100 ng/mL    Comment: Vitamin D Status         25-OH Vitamin D: . Deficiency:                    <20 ng/mL Insufficiency:             20 - 29 ng/mL Optimal:                 > or = 30 ng/mL . For 25-OH Vitamin D testing on patients on  D2-supplementation and patients for whom quantitation  of D2 and D3 fractions is required, the QuestAssureD(TM) 25-OH VIT D, (D2,D3), LC/MS/MS is recommended: order  code 46962 (patients >85yrs). . For more information on this test, go to: http://education.[facility].com/faq/FAQ163 (This link is being provided for  informational/educational purposes only.)   Microalbumin / creatinine urine ratio     Status: None   Collection Time: 07/07/17  9:28 AM  Result Value Ref Range   Creatinine, Urine 121 20 - 275 mg/dL   Microalb, Ur 3.0 mg/dL    Comment: Reference Range Not established    Microalb Creat Ratio 25 <30 mcg/mg creat    Comment: . The ADA defines abnormalities in albumin excretion as follows: Marland Kitchen Category         Result (mcg/mg creatinine) . Normal                    <30 Microalbuminuria         30-299  Clinical albuminuria   >  OR = 300 . The ADA recommends that at least two of three specimens collected within a 3-6 month period be abnormal before considering a patient to be within a diagnostic category.     Diabetic Labs (most recent): Lab Results  Component Value Date   HGBA1C 6.7 (H) 07/07/2017   HGBA1C 7.8 (H) 04/07/2017   HGBA1C 7.4 (H) 01/06/2017   Lipid Panel     Component Value Date/Time   CHOL 184 07/07/2017 0928   TRIG 191 (H) 07/07/2017 0928   HDL 42 (L) 07/07/2017 0928   CHOLHDL 4.4 07/07/2017 0928   VLDL 33 (H) 03/13/2016 1015   LDLCALC 111 (H) 07/07/2017 0928    Assessment & Plan:   1. Uncontrolled type 2 diabetes mellitus with complication, with long-term current use of insulin (HCC)  -She  presents with much better glycemic profile, A1c improving to 6.7%, generally improving from 9.4%.  Patient remains at a high risk for more acute and chronic complications of diabetes which include CAD, CVA, CKD, retinopathy, and neuropathy. These are all discussed in detail with the patient.  Recent labs reviewed and discussed with her.   - I have re-counseled the patient on diet management   by adopting a carbohydrate restricted / protein rich  Diet.  -  Suggestion is made for her to avoid simple carbohydrates  from her diet including Cakes, Sweet Desserts / Pastries, Ice Cream, Soda (diet and regular), Sweet Tea, Candies, Chips, Cookies, Store Bought Juices, Alcohol in Excess of  1-2 drinks a day, Artificial Sweeteners, and "Sugar-free" Products. This will help patient to have stable blood glucose profile and potentially avoid unintended weight gain.  - Patient is advised to stick to a routine mealtimes to eat 3 meals  a day and avoid unnecessary snacks ( to snack only to correct hypoglycemia).   - I have approached patient with the following individualized plan to manage diabetes and patient agrees.  - She came with stable near target fasting blood glucose profile, but above target postprandial blood glucose profile. Her A1c is better at 6.7%.   - I advised her to continue Tresiba 60 units  daily at bedtime, associated with monitoring of blood glucose 2 times a day-daily before breakfast and at bedtime. Continue to hold prandial insulin Humalog for now.   -Patient is encouraged to call clinic for blood glucose levels less than 70 or above 200 mg /dl. - I will continue metformin 500 mg by mouth twice a day and discontinue Invokana .  - Patient specific target  for A1c; LDL, HDL, Triglycerides, and  Waist Circumference were discussed in detail.  2) BP/HTN:   her blood pressure is not controlled to target. I advised her to continue her current blood pressure medications including  ACEI/ARB.  3) Lipids/HPL: Uncontrolled with LDL 111, she is advised to continue her atorvastatin 80 mg p.o. nightly.    4)  Postsurgical hypothyroidism  -Her previsit thyroid function tests are consistent with appropriate replacement.  - I advised her to continue  levothyroxine 125 g by mouth every morning.    - We discussed about correct intake of levothyroxine, at fasting, with water, separated by at least 30 minutes from breakfast, and separated by more than 4 hours from calcium, iron, multivitamins, acid reflux medications (PPIs). -Patient is made aware of the fact that thyroid hormone replacement is needed for life, dose to be adjusted by periodic monitoring of thyroid function tests.  5)  Weight/Diet:   she has a steady  weight lately,  declines to see the dietitian, exercise, and carbohydrates information provided.  6) Chronic Care/Health Maintenance:  -Patient is on ACEI/ARB and Statin medications and encouraged to continue to follow up with Ophthalmology, Podiatrist at least yearly or according to recommendations, and advised to quit smoking. I have recommended yearly flu vaccine and pneumonia vaccination at least every 5 years; moderate intensity exercise for up to 150 minutes weekly; and  sleep for at least 7 hours a day. - I advised patient to maintain close follow up with Joette Catching, MD for primary care needs.  - Time spent with the patient: 25 min, of which >50% was spent in reviewing her blood glucose logs , discussing her hypo- and hyper-glycemic episodes, reviewing her current and  previous labs and insulin doses and developing a plan to avoid hypo- and hyper-glycemia. Please refer to Patient Instructions for Blood Glucose Monitoring and Insulin/Medications Dosing Guide"  in media tab for additional information. Linda Riggs participated in the discussions, expressed understanding, and voiced agreement with the above plans.  All questions were answered to her  satisfaction. she is encouraged to contact clinic should she have any questions or concerns prior to her return visit.  Follow up plan: -Return in about 3 months (around 10/15/2017) for follow up with pre-visit labs, meter, and logs.  Marquis Lunch, MD Phone: [phone]  Fax: [phone]  -  This note was partially dictated with voice recognition software. Similar sounding words can be transcribed inadequately or may not  be corrected upon review.  07/16/2017, 1:15 PM

## 2017-08-14 ENCOUNTER — Other Ambulatory Visit (HOSPITAL_COMMUNITY): Payer: Self-pay | Admitting: Physician Assistant

## 2017-08-14 DIAGNOSIS — Z1231 Encounter for screening mammogram for malignant neoplasm of breast: Secondary | ICD-10-CM

## 2017-10-03 ENCOUNTER — Other Ambulatory Visit: Payer: Self-pay | Admitting: "Endocrinology

## 2017-10-07 LAB — HEMOGLOBIN A1C
HEMOGLOBIN A1C: 8 %{Hb} — AB (ref <5.7)
MEAN PLASMA GLUCOSE: 183 (calc)
eAG (mmol/L): 10.1 (calc)

## 2017-10-07 LAB — T4, FREE: FREE T4: 1.8 ng/dL (ref 0.8–1.8)

## 2017-10-07 LAB — TSH: TSH: 1.02 m[IU]/L (ref 0.40–4.50)

## 2017-10-15 ENCOUNTER — Encounter: Payer: Self-pay | Admitting: "Endocrinology

## 2017-10-15 ENCOUNTER — Ambulatory Visit: Payer: Medicare Other | Admitting: "Endocrinology

## 2017-10-15 VITALS — BP 137/82 | HR 83 | Ht 63.0 in | Wt 181.0 lb

## 2017-10-15 DIAGNOSIS — E890 Postprocedural hypothyroidism: Secondary | ICD-10-CM

## 2017-10-15 DIAGNOSIS — E1122 Type 2 diabetes mellitus with diabetic chronic kidney disease: Secondary | ICD-10-CM | POA: Diagnosis not present

## 2017-10-15 DIAGNOSIS — E782 Mixed hyperlipidemia: Secondary | ICD-10-CM | POA: Diagnosis not present

## 2017-10-15 DIAGNOSIS — Z794 Long term (current) use of insulin: Secondary | ICD-10-CM

## 2017-10-15 DIAGNOSIS — F17200 Nicotine dependence, unspecified, uncomplicated: Secondary | ICD-10-CM

## 2017-10-15 DIAGNOSIS — N183 Chronic kidney disease, stage 3 unspecified: Secondary | ICD-10-CM

## 2017-10-15 DIAGNOSIS — I10 Essential (primary) hypertension: Secondary | ICD-10-CM | POA: Diagnosis not present

## 2017-10-15 MED ORDER — INSULIN DEGLUDEC 100 UNIT/ML ~~LOC~~ SOPN: 70.0000 [IU] | PEN_INJECTOR | Freq: Every day | SUBCUTANEOUS | 1 refills | Status: DC

## 2017-10-15 NOTE — Progress Notes (Signed)
Endocrinology follow-up note  Subjective:    Patient ID: Linda Riggs, female    DOB: 1955-12-13, PCP Joette Catching, MD   Past Medical History:  Diagnosis Date  . Arthritis   . Chronic headaches   . Depression   . Diverticulosis   . Fibromyalgia   . GERD (gastroesophageal reflux disease)   . Goiter   . Hypothyroidism   . Internal hemorrhoids   . Iron deficiency anemia    Dr. Arlyce Dice 11/10  . Osteoporosis   . Ruptured lumbar disc   . Type 2 diabetes mellitus (HCC)    Past Surgical History:  Procedure Laterality Date  . Arthroscopic left knee surgery Bilateral   . BACK SURGERY    . CHOLECYSTECTOMY N/A 11/21/2016   Procedure: LAPAROSCOPIC CHOLECYSTECTOMY;  Surgeon: Franky Macho, MD;  Location: [facility];  Service: General;  Laterality: N/A;  . COLONOSCOPY  2016   Dr. Arlyce Dice: diverticulosis  . DILATION AND CURETTAGE OF UTERUS    . ESOPHAGOGASTRODUODENOSCOPY  2016   Dr. Arlyce Dice: early GE junction stricture s/p dilation   . THYROIDECTOMY N/A 06/06/2013   Procedure: TOTAL THYROIDECTOMY;  Surgeon: Dalia Heading, MD;  Location: [facility];  Service: General;  Laterality: N/A;   Social History   Socioeconomic History  . Marital status: Single    Spouse name: Not on file  . Number of children: Not on file  . Years of education: Not on file  . Highest education level: Not on file  Occupational History  . Not on file  Social Needs  . Financial resource strain: Not on file  . Food insecurity:    Worry: Not on file    Inability: Not on file  . Transportation needs:    Medical: Not on file    Non-medical: Not on file  Tobacco Use  . Smoking status: Current Every Day Smoker    Packs/day: 0.50    Years: 20.00    Pack years: 10.00    Types: Cigarettes    Last attempt to quit: 04/27/2015    Years since quitting: 2.4  . Smokeless tobacco: Never Used  Substance and Sexual Activity  . Alcohol use: No    Alcohol/week: 0.0 oz  . Drug use: No  . Sexual activity: Yes    Birth  control/protection: Post-menopausal  Lifestyle  . Physical activity:    Days per week: Not on file    Minutes per session: Not on file  . Stress: Not on file  Relationships  . Social connections:    Talks on phone: Not on file    Gets together: Not on file    Attends religious service: Not on file    Active member of club or organization: Not on file    Attends meetings of clubs or organizations: Not on file    Relationship status: Not on file  Other Topics Concern  . Not on file  Social History Narrative  . Not on file   Outpatient Encounter Medications as of 10/15/2017  Medication Sig  . alendronate (FOSAMAX) 70 MG tablet Take 10 mg by mouth once a week.  Marland Kitchen atorvastatin (LIPITOR) 80 MG tablet Take 80 mg by mouth daily.  . Calcium Carbonate-Vit D-Min (CALCIUM 1200 PO) Take 1 capsule by mouth daily.  Marland Kitchen escitalopram (LEXAPRO) 20 MG tablet Take 20 mg by mouth daily.    Marland Kitchen esomeprazole (NEXIUM) 40 MG capsule Take 40 mg by mouth daily.   . fenofibrate 160 MG tablet Take 160 mg by  mouth daily.   . fexofenadine (ALLEGRA) 180 MG tablet Take 180 mg by mouth daily.    Marland Kitchen gabapentin (NEURONTIN) 300 MG capsule Take 300 mg by mouth 3 (three) times daily.  . hydrochlorothiazide (HYDRODIURIL) 25 MG tablet Take 25 mg by mouth daily.  . insulin degludec (TRESIBA FLEXTOUCH) 100 UNIT/ML SOPN FlexTouch Pen Inject 0.7 mLs (70 Units total) into the skin at bedtime.  Marland Kitchen levothyroxine (SYNTHROID, LEVOTHROID) 125 MCG tablet TAKE ONE TABLET EVERY MORNING  . meloxicam (MOBIC) 7.5 MG tablet Take 7.5 mg by mouth 2 (two) times daily.   . metFORMIN (GLUCOPHAGE-XR) 500 MG 24 hr tablet TAKE ONE TABLET BY MOUTH TWICE DAILY  . ONETOUCH DELICA LANCETS 33G MISC 4 TIMES DAILY  . ONETOUCH VERIO test strip 4 TIMES DAILY  . POTASSIUM PO Take 1 tablet by mouth daily.  . promethazine (PHENERGAN) 25 MG suppository Place 1 suppository (25 mg total) rectally every 6 (six) hours as needed for nausea or vomiting.  . quinapril  (ACCUPRIL) 10 MG tablet Take 10 mg by mouth daily.   Marland Kitchen rOPINIRole (REQUIP) 1 MG tablet Take by mouth at bedtime.  Marland Kitchen ULTICARE SHORT PEN NEEDLES 31G X 8 MM MISC 4 TIMES DAILY  . [DISCONTINUED] Aspirin-Acetaminophen-Caffeine (EXCEDRIN PO) Take 1-2 tablets by mouth every 4 (four) hours as needed (pain).  . [DISCONTINUED] HYDROcodone-acetaminophen (NORCO/VICODIN) 5-325 MG tablet 1 or 2 tabs PO q6 hours prn pain  . [DISCONTINUED] TRESIBA FLEXTOUCH 100 UNIT/ML SOPN FlexTouch Pen INJECT 0.6ML SQ AT 10PM   No facility-administered encounter medications on file as of 10/15/2017.    ALLERGIES: Allergies  Allergen Reactions  . Aspirin Itching  . Flonase [Fluticasone Propionate] Itching  . Prednisone Itching  . Tramadol Itching   VACCINATION STATUS:  There is no immunization history on file for this patient.  Diabetes  She presents for her follow-up diabetic visit. She has type 2 diabetes mellitus. Her disease course has been worsening. Pertinent negatives for hypoglycemia include no confusion, headaches, pallor or seizures. Associated symptoms include fatigue. Pertinent negatives for diabetes include no chest pain, no polydipsia, no polyphagia and no polyuria. Symptoms are worsening. There are no diabetic complications. Risk factors for coronary artery disease include diabetes mellitus, dyslipidemia, tobacco exposure, sedentary lifestyle and hypertension. Current diabetic treatment includes insulin injections and oral agent (dual therapy). She is compliant with treatment most of the time. Her weight is stable. She is following a generally unhealthy diet. When asked about meal planning, she reported none. She rarely participates in exercise. Her breakfast blood glucose range is generally 140-180 mg/dl. Her overall blood glucose range is 140-180 mg/dl. An ACE inhibitor/angiotensin II receptor blocker is being taken.  Thyroid Problem  Presents for follow-up (She has postsurgical hypothyroidism.  Surgery was  performed due to large multinodular goiter with compressive neck symptoms.) visit. Symptoms include fatigue. Patient reports no cold intolerance, diarrhea, heat intolerance or palpitations. The symptoms have been stable. Past treatments include levothyroxine. Prior procedures include thyroidectomy. Her past medical history is significant for diabetes and hyperlipidemia.  Hypertension  This is a chronic problem. The current episode started more than 1 year ago. The problem is controlled. Pertinent negatives include no chest pain, headaches, palpitations or shortness of breath. Risk factors for coronary artery disease include dyslipidemia, diabetes mellitus, smoking/tobacco exposure, sedentary lifestyle and post-menopausal state. Past treatments include ACE inhibitors. Identifiable causes of hypertension include a thyroid problem.  Hyperlipidemia  This is a chronic problem. The current episode started more than 1 year ago. Recent  lipid tests were reviewed and are high. Exacerbating diseases include diabetes. Pertinent negatives include no chest pain, myalgias or shortness of breath. Current antihyperlipidemic treatment includes statins. Risk factors for coronary artery disease include diabetes mellitus, dyslipidemia, hypertension, a sedentary lifestyle and post-menopausal.    Review of Systems  Constitutional: Positive for fatigue. Negative for unexpected weight change.  HENT: Negative for trouble swallowing and voice change.   Eyes: Negative for visual disturbance.  Respiratory: Negative for cough, shortness of breath and wheezing.   Cardiovascular: Negative for chest pain, palpitations and leg swelling.  Gastrointestinal: Negative for diarrhea, nausea and vomiting.  Endocrine: Negative for cold intolerance, heat intolerance, polydipsia, polyphagia and polyuria.  Musculoskeletal: Negative for arthralgias and myalgias.  Skin: Negative for color change, pallor, rash and wound.  Neurological: Negative  for seizures and headaches.  Psychiatric/Behavioral: Negative for confusion and suicidal ideas.    Objective:    BP 137/82   Pulse 83   Ht 5\' 3"  (1.6 m)   Wt 181 lb (82.1 kg)   BMI 32.06 kg/m   Wt Readings from Last 3 Encounters:  10/15/17 181 lb (82.1 kg)  07/16/17 178 lb (80.7 kg)  04/16/17 176 lb (79.8 kg)    Physical Exam  Constitutional: She is oriented to person, place, and time. She appears well-developed.  HENT:  Head: Normocephalic and atraumatic.  Eyes: EOM are normal.  Neck: Normal range of motion. Neck supple. No tracheal deviation present. No thyromegaly present.  Cardiovascular: Normal rate.  Pulmonary/Chest: Effort normal.  Abdominal: Bowel sounds are normal. There is no tenderness. There is no guarding.  Musculoskeletal: Normal range of motion. She exhibits no edema.  Neurological: She is alert and oriented to person, place, and time. She has normal reflexes. No cranial nerve deficit. Coordination normal.  Skin: Skin is warm and dry. No rash noted. No erythema. No pallor.  Psychiatric: She has a normal mood and affect. Judgment normal.   Recent Results (from the past 2160 hour(s))  Hemoglobin A1c     Status: Abnormal   Collection Time: 10/06/17 10:05 AM  Result Value Ref Range   Hgb A1c MFr Bld 8.0 (H) <5.7 % of total Hgb    Comment: For someone without known diabetes, a hemoglobin A1c value of 6.5% or greater indicates that they may have  diabetes and this should be confirmed with a follow-up  test. . For someone with known diabetes, a value <7% indicates  that their diabetes is well controlled and a value  greater than or equal to 7% indicates suboptimal  control. A1c targets should be individualized based on  duration of diabetes, age, comorbid conditions, and  other considerations. . Currently, no consensus exists regarding use of hemoglobin A1c for diagnosis of diabetes for children. .    Mean Plasma Glucose 183 (calc)   eAG (mmol/L) 10.1  (calc)  TSH     Status: None   Collection Time: 10/06/17 10:05 AM  Result Value Ref Range   TSH 1.02 0.40 - 4.50 mIU/L  T4, free     Status: None   Collection Time: 10/06/17 10:05 AM  Result Value Ref Range   Free T4 1.8 0.8 - 1.8 ng/dL    Diabetic Labs (most recent): Lab Results  Component Value Date   HGBA1C 8.0 (H) 10/06/2017   HGBA1C 6.7 (H) 07/07/2017   HGBA1C 7.8 (H) 04/07/2017   Lipid Panel     Component Value Date/Time   CHOL 184 07/07/2017 0928   TRIG 191 (H)  07/07/2017 0928   HDL 42 (L) 07/07/2017 0928   CHOLHDL 4.4 07/07/2017 0928   VLDL 33 (H) 03/13/2016 1015   LDLCALC 111 (H) 07/07/2017 0928    Assessment & Plan:   1. Uncontrolled type 2 diabetes mellitus with complication, with long-term current use of insulin (HCC)  -She presents with higher A1c of 8%, increasing from 6.7%.   - Patient remains at a high risk for more acute and chronic complications of diabetes which include CAD, CVA, CKD, retinopathy, and neuropathy. These are all discussed in detail with the patient.  Recent labs reviewed and discussed with her.   - I have re-counseled the patient on diet management   by adopting a carbohydrate restricted / protein rich  Diet.  -She admits to dietary indiscretion since her last visit.  - suggestion is made for her to avoid simple carbohydrates  from her diet including Cakes, Sweet Desserts / Pastries, Ice Cream, Soda (diet and regular), Sweet Tea, Candies, Chips, Cookies, Store Bought Juices, Alcohol in Excess of  1-2 drinks a day, Artificial Sweeteners, and "Sugar-free" Products. This will help patient to have stable blood glucose profile and potentially avoid unintended weight gain.  - Patient is advised to stick to a routine mealtimes to eat 3 meals  a day and avoid unnecessary snacks ( to snack only to correct hypoglycemia).   - I have approached patient with the following individualized plan to manage diabetes and patient agrees.  - I advised her  to increase her Tresiba to 70  units  daily at bedtime, associated with monitoring of blood glucose 2 times a day-daily before breakfast and at bedtime. Continue to hold prandial insulin Humalog for now.  -She will be reconsidered for prandial insulin Humalog if her A1c remains above 8% next visit. -Patient is encouraged to call clinic for blood glucose levels less than 70 or above 200 mg /dl. - I will continue metformin 500 mg by mouth twice a day after breakfast and after supper.   - Patient specific target  for A1c; LDL, HDL, Triglycerides, and  Waist Circumference were discussed in detail.  2) BP/HTN: Her blood pressure is controlled to target.  She is advised to continue her current blood pressure medications including lisinopril 10 mg p.o. daily, hydrochlorothiazide 25 mg p.o. daily.   3) Lipids/HPL: Uncontrolled with LDL 111.  She is advised to continue her atorvastatin 80 mg p.o. nightly, and fenofibrate 160 mg p.o. Daily.  4)  Postsurgical hypothyroidism  -Her thyroid function tests are consistent with appropriate replacement.    -I advised her to continue levothyroxine 125 mcg p.o. every morning.   - We discussed about correct intake of levothyroxine, at fasting, with water, separated by at least 30 minutes from breakfast, and separated by more than 4 hours from calcium, iron, multivitamins, acid reflux medications (PPIs). -Patient is made aware of the fact that thyroid hormone replacement is needed for life, dose to be adjusted by periodic monitoring of thyroid function tests.  5)  Weight/Diet: She is any weight progressively.  declines to see the dietitian, exercise, and carbohydrates information provided.  6) Chronic Care/Health Maintenance:  -Patient is on ACEI/ARB and Statin medications and encouraged to continue to follow up with Ophthalmology, Podiatrist at least yearly or according to recommendations, and advised to quit smoking. I have recommended yearly flu vaccine and  pneumonia vaccination at least every 5 years; moderate intensity exercise for up to 150 minutes weekly; and  sleep for at least 7 hours  a day. - I advised patient to maintain close follow up with Joette Catching, MD for primary care needs.  - Time spent with the patient: 25 min, of which >50% was spent in reviewing her blood glucose logs , discussing her hypo- and hyper-glycemic episodes, reviewing her current and  previous labs and insulin doses and developing a plan to avoid hypo- and hyper-glycemia. Please refer to Patient Instructions for Blood Glucose Monitoring and Insulin/Medications Dosing Guide"  in media tab for additional information. Nile Dear participated in the discussions, expressed understanding, and voiced agreement with the above plans.  All questions were answered to her satisfaction. she is encouraged to contact clinic should she have any questions or concerns prior to her return visit.   Follow up plan: -Return in about 3 months (around 01/15/2018) for meter, and logs, follow up with pre-visit labs, meter, and logs.  Marquis Lunch, MD Phone: [phone]  Fax: [phone]  -  This note was partially dictated with voice recognition software. Similar sounding words can be transcribed inadequately or may not  be corrected upon review.  10/15/2017, 1:47 PM

## 2017-10-15 NOTE — Patient Instructions (Signed)

## 2017-12-11 ENCOUNTER — Other Ambulatory Visit: Payer: Self-pay | Admitting: "Endocrinology

## 2017-12-23 ENCOUNTER — Ambulatory Visit (HOSPITAL_COMMUNITY)
Admission: RE | Admit: 2017-12-23 | Discharge: 2017-12-23 | Disposition: A | Discharge status: Home / Self Care | Payer: Medicare Other | Source: Ambulatory Visit | Attending: Physician Assistant | Admitting: Physician Assistant

## 2017-12-23 DIAGNOSIS — Z1231 Encounter for screening mammogram for malignant neoplasm of breast: Secondary | ICD-10-CM | POA: Insufficient documentation

## 2018-01-15 ENCOUNTER — Ambulatory Visit: Payer: Medicare Other | Admitting: "Endocrinology

## 2018-01-26 ENCOUNTER — Other Ambulatory Visit: Payer: Self-pay | Admitting: "Endocrinology

## 2018-01-26 DIAGNOSIS — E039 Hypothyroidism, unspecified: Secondary | ICD-10-CM

## 2018-01-26 DIAGNOSIS — E1165 Type 2 diabetes mellitus with hyperglycemia: Secondary | ICD-10-CM

## 2018-01-27 LAB — HEMOGLOBIN A1C
EAG (MMOL/L): 8.7 (calc)
HEMOGLOBIN A1C: 7.1 %{Hb} — AB (ref <5.7)
Mean Plasma Glucose: 157 (calc)

## 2018-01-27 LAB — COMPREHENSIVE METABOLIC PANEL
AG Ratio: 1.6 (calc) (ref 1.0–2.5)
ALBUMIN MSPROF: 3.9 g/dL (ref 3.6–5.1)
ALT: 21 U/L (ref 6–29)
AST: 19 U/L (ref 10–35)
Alkaline phosphatase (APISO): 37 U/L (ref 33–130)
BILIRUBIN TOTAL: 0.3 mg/dL (ref 0.2–1.2)
BUN/Creatinine Ratio: 26 (calc) — ABNORMAL HIGH (ref 6–22)
BUN: 38 mg/dL — AB (ref 7–25)
CALCIUM: 9.1 mg/dL (ref 8.6–10.4)
CO2: 21 mmol/L (ref 20–32)
CREATININE: 1.46 mg/dL — AB (ref 0.50–0.99)
Chloride: 108 mmol/L (ref 98–110)
GLOBULIN: 2.4 g/dL (ref 1.9–3.7)
Glucose, Bld: 65 mg/dL (ref 65–99)
POTASSIUM: 4.6 mmol/L (ref 3.5–5.3)
SODIUM: 141 mmol/L (ref 135–146)
TOTAL PROTEIN: 6.3 g/dL (ref 6.1–8.1)

## 2018-01-27 LAB — TSH: TSH: 1.51 mIU/L (ref 0.40–4.50)

## 2018-01-27 LAB — T4, FREE: FREE T4: 1.6 ng/dL (ref 0.8–1.8)

## 2018-02-03 ENCOUNTER — Ambulatory Visit: Payer: Medicare Other | Admitting: "Endocrinology

## 2018-02-03 ENCOUNTER — Encounter: Payer: Self-pay | Admitting: "Endocrinology

## 2018-02-03 VITALS — BP 142/80 | HR 77 | Ht 63.0 in | Wt 185.0 lb

## 2018-02-03 DIAGNOSIS — I10 Essential (primary) hypertension: Secondary | ICD-10-CM

## 2018-02-03 DIAGNOSIS — N183 Chronic kidney disease, stage 3 unspecified: Secondary | ICD-10-CM

## 2018-02-03 DIAGNOSIS — Z794 Long term (current) use of insulin: Secondary | ICD-10-CM

## 2018-02-03 DIAGNOSIS — E890 Postprocedural hypothyroidism: Secondary | ICD-10-CM | POA: Diagnosis not present

## 2018-02-03 DIAGNOSIS — E782 Mixed hyperlipidemia: Secondary | ICD-10-CM | POA: Diagnosis not present

## 2018-02-03 DIAGNOSIS — E1122 Type 2 diabetes mellitus with diabetic chronic kidney disease: Secondary | ICD-10-CM

## 2018-02-03 NOTE — Progress Notes (Signed)
Endocrinology follow-up note  Subjective:    Patient ID: Linda Riggs, female    DOB: 06/06/1955, PCP Joette Catching, MD   Past Medical History:  Diagnosis Date  . Arthritis   . Chronic headaches   . Depression   . Diverticulosis   . Fibromyalgia   . GERD (gastroesophageal reflux disease)   . Goiter   . Hypothyroidism   . Internal hemorrhoids   . Iron deficiency anemia    Dr. Arlyce Dice 11/10  . Osteoporosis   . Ruptured lumbar disc   . Type 2 diabetes mellitus (HCC)    Past Surgical History:  Procedure Laterality Date  . Arthroscopic left knee surgery Bilateral   . BACK SURGERY    . CHOLECYSTECTOMY N/A 11/21/2016   Procedure: LAPAROSCOPIC CHOLECYSTECTOMY;  Surgeon: Franky Macho, MD;  Location: [facility];  Service: General;  Laterality: N/A;  . COLONOSCOPY  2016   Dr. Arlyce Dice: diverticulosis  . DILATION AND CURETTAGE OF UTERUS    . ESOPHAGOGASTRODUODENOSCOPY  2016   Dr. Arlyce Dice: early GE junction stricture s/p dilation   . THYROIDECTOMY N/A 06/06/2013   Procedure: TOTAL THYROIDECTOMY;  Surgeon: Dalia Heading, MD;  Location: [facility];  Service: General;  Laterality: N/A;   Social History   Socioeconomic History  . Marital status: Single    Spouse name: Not on file  . Number of children: Not on file  . Years of education: Not on file  . Highest education level: Not on file  Occupational History  . Not on file  Social Needs  . Financial resource strain: Not on file  . Food insecurity:    Worry: Not on file    Inability: Not on file  . Transportation needs:    Medical: Not on file    Non-medical: Not on file  Tobacco Use  . Smoking status: Current Every Day Smoker    Packs/day: 0.50    Years: 20.00    Pack years: 10.00    Types: Cigarettes    Last attempt to quit: 04/27/2015    Years since quitting: 2.7  . Smokeless tobacco: Never Used  Substance and Sexual Activity  . Alcohol use: No    Alcohol/week: 0.0 standard drinks  . Drug use: No  . Sexual activity: Yes     Birth control/protection: Post-menopausal  Lifestyle  . Physical activity:    Days per week: Not on file    Minutes per session: Not on file  . Stress: Not on file  Relationships  . Social connections:    Talks on phone: Not on file    Gets together: Not on file    Attends religious service: Not on file    Active member of club or organization: Not on file    Attends meetings of clubs or organizations: Not on file    Relationship status: Not on file  Other Topics Concern  . Not on file  Social History Narrative  . Not on file   Outpatient Encounter Medications as of 02/03/2018  Medication Sig  . alendronate (FOSAMAX) 70 MG tablet Take 10 mg by mouth once a week.  Marland Kitchen atorvastatin (LIPITOR) 80 MG tablet Take 80 mg by mouth daily.  . Calcium Carbonate-Vit D-Min (CALCIUM 1200 PO) Take 1 capsule by mouth daily.  Marland Kitchen esomeprazole (NEXIUM) 40 MG capsule Take 40 mg by mouth daily.   . fenofibrate 160 MG tablet Take 160 mg by mouth daily.   . fexofenadine (ALLEGRA) 180 MG tablet Take 180 mg by  mouth daily.    Marland Kitchen gabapentin (NEURONTIN) 300 MG capsule Take 300 mg by mouth 3 (three) times daily.  . hydrochlorothiazide (HYDRODIURIL) 25 MG tablet Take 25 mg by mouth daily.  . insulin degludec (TRESIBA FLEXTOUCH) 100 UNIT/ML SOPN FlexTouch Pen Inject 0.7 mLs (70 Units total) into the skin at bedtime.  Marland Kitchen levothyroxine (SYNTHROID, LEVOTHROID) 125 MCG tablet TAKE ONE TABLET EVERY MORNING  . meloxicam (MOBIC) 7.5 MG tablet Take 7.5 mg by mouth 2 (two) times daily.   . metFORMIN (GLUCOPHAGE-XR) 500 MG 24 hr tablet TAKE ONE TABLET BY MOUTH TWICE DAILY  . ONETOUCH DELICA LANCETS 33G MISC 4 TIMES DAILY  . ONETOUCH VERIO test strip 4 TIMES DAILY  . POTASSIUM PO Take 1 tablet by mouth daily.  . promethazine (PHENERGAN) 25 MG suppository Place 1 suppository (25 mg total) rectally every 6 (six) hours as needed for nausea or vomiting.  . quinapril (ACCUPRIL) 10 MG tablet Take 10 mg by mouth daily.   Marland Kitchen  rOPINIRole (REQUIP) 1 MG tablet Take by mouth at bedtime.  . TRESIBA FLEXTOUCH 100 UNIT/ML SOPN FlexTouch Pen INJECT 0.6ML SQ AT 10PM  . ULTICARE SHORT PEN NEEDLES 31G X 8 MM MISC 4 TIMES DAILY  . [DISCONTINUED] escitalopram (LEXAPRO) 20 MG tablet Take 20 mg by mouth daily.     No facility-administered encounter medications on file as of 02/03/2018.    ALLERGIES: Allergies  Allergen Reactions  . Aspirin Itching  . Flonase [Fluticasone Propionate] Itching  . Prednisone Itching  . Tramadol Itching   VACCINATION STATUS:  There is no immunization history on file for this patient.  Diabetes  She presents for her follow-up diabetic visit. She has type 2 diabetes mellitus. Her disease course has been improving. Pertinent negatives for hypoglycemia include no confusion, headaches, pallor or seizures. Pertinent negatives for diabetes include no chest pain, no fatigue, no polydipsia, no polyphagia and no polyuria. Symptoms are improving. There are no diabetic complications. Risk factors for coronary artery disease include diabetes mellitus, dyslipidemia, tobacco exposure, sedentary lifestyle and hypertension. Current diabetic treatment includes insulin injections and oral agent (dual therapy). She is compliant with treatment most of the time. Her weight is stable. She is following a generally unhealthy diet. When asked about meal planning, she reported none. She rarely participates in exercise. Blood glucose monitoring compliance is inadequate. Her breakfast blood glucose range is generally 140-180 mg/dl. Her overall blood glucose range is 140-180 mg/dl. An ACE inhibitor/angiotensin II receptor blocker is being taken.  Thyroid Problem  Presents for follow-up (She has postsurgical hypothyroidism.  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 Patient reports no cold intolerance, diarrhea, fatigue, heat intolerance or palpitations. The symptoms have been  stable. Past treatments include levothyroxine. Prior procedures include thyroidectomy. Her past medical history is significant for diabetes and hyperlipidemia.  Hypertension  This is a chronic problem. The current episode started more than 1 year ago. The problem is controlled. Pertinent negatives include no chest pain, headaches, palpitations or shortness of breath. Risk factors for coronary artery disease include dyslipidemia, diabetes mellitus, smoking/tobacco exposure, sedentary lifestyle and post-menopausal state. Past treatments include ACE inhibitors. Identifiable causes of hypertension include a thyroid problem.  Hyperlipidemia  This is a chronic problem. The current episode started more than 1 year ago. Recent lipid tests were reviewed and are high. Exacerbating diseases include diabetes. Pertinent negatives include no chest pain, myalgias or shortness of breath. Current antihyperlipidemic treatment includes statins. Risk factors for coronary artery  disease include diabetes mellitus, dyslipidemia, hypertension, a sedentary lifestyle and post-menopausal.    Review of Systems  Constitutional: Negative for fatigue and unexpected weight change.  HENT: Negative for trouble swallowing and voice change.   Eyes: Negative for visual disturbance.  Respiratory: Negative for cough, shortness of breath and wheezing.   Cardiovascular: Negative for chest pain, palpitations and leg swelling.  Gastrointestinal: Negative for diarrhea, nausea and vomiting.  Endocrine: Negative for cold intolerance, heat intolerance, polydipsia, polyphagia and polyuria.  Musculoskeletal: Negative for arthralgias and myalgias.  Skin: Negative for color change, pallor, rash and wound.  Neurological: Negative for seizures and headaches.  Psychiatric/Behavioral: Negative for confusion and suicidal ideas.    Objective:    BP (!) 142/80   Pulse 77   Ht 5\' 3"  (1.6 m)   Wt 185 lb (83.9 kg)   BMI 32.77 kg/m   Wt Readings  from Last 3 Encounters:  02/03/18 185 lb (83.9 kg)  10/15/17 181 lb (82.1 kg)  07/16/17 178 lb (80.7 kg)    Physical Exam  Constitutional: She is oriented to person, place, and time. She appears well-developed.  HENT:  Head: Normocephalic and atraumatic.  Eyes: EOM are normal.  Neck: Normal range of motion. Neck supple. No tracheal deviation present. No thyromegaly present.  Cardiovascular: Normal rate.  Pulmonary/Chest: Effort normal.  Abdominal: Bowel sounds are normal. There is no tenderness. There is no guarding.  Musculoskeletal: Normal range of motion. She exhibits no edema.  Neurological: She is alert and oriented to person, place, and time. No cranial nerve deficit. Coordination normal.  Skin: Skin is warm and dry. No rash noted. No erythema. No pallor.  Psychiatric: She has a normal mood and affect. Judgment normal.   Recent Results (from the past 2160 hour(s))  Comprehensive metabolic panel     Status: Abnormal   Collection Time: 01/26/18 10:26 AM  Result Value Ref Range   Glucose, Bld 65 65 - 99 mg/dL    Comment: .            Fasting reference interval .    BUN 38 (H) 7 - 25 mg/dL   Creat 1.61 (H) 0.96 - 0.99 mg/dL    Comment: For patients >26 years of age, the reference limit for Creatinine is approximately 13% higher for people identified as African-American. .    BUN/Creatinine Ratio 26 (H) 6 - 22 (calc)   Sodium 141 135 - 146 mmol/L   Potassium 4.6 3.5 - 5.3 mmol/L   Chloride 108 98 - 110 mmol/L   CO2 21 20 - 32 mmol/L   Calcium 9.1 8.6 - 10.4 mg/dL   Total Protein 6.3 6.1 - 8.1 g/dL   Albumin 3.9 3.6 - 5.1 g/dL   Globulin 2.4 1.9 - 3.7 g/dL (calc)   AG Ratio 1.6 1.0 - 2.5 (calc)   Total Bilirubin 0.3 0.2 - 1.2 mg/dL   Alkaline phosphatase (APISO) 37 33 - 130 U/L   AST 19 10 - 35 U/L   ALT 21 6 - 29 U/L  Hemoglobin A1c     Status: Abnormal   Collection Time: 01/26/18 10:26 AM  Result Value Ref Range   Hgb A1c MFr Bld 7.1 (H) <5.7 % of total Hgb     Comment: For someone without known diabetes, a hemoglobin A1c value of 6.5% or greater indicates that they may have  diabetes and this should be confirmed with a follow-up  test. . For someone with known diabetes, a value <7% indicates  that their diabetes is well controlled and a value  greater than or equal to 7% indicates suboptimal  control. A1c targets should be individualized based on  duration of diabetes, age, comorbid conditions, and  other considerations. . Currently, no consensus exists regarding use of hemoglobin A1c for diagnosis of diabetes for children. .    Mean Plasma Glucose 157 (calc)   eAG (mmol/L) 8.7 (calc)  TSH     Status: None   Collection Time: 01/26/18 10:26 AM  Result Value Ref Range   TSH 1.51 0.40 - 4.50 mIU/L  T4, Free     Status: None   Collection Time: 01/26/18 10:26 AM  Result Value Ref Range   Free T4 1.6 0.8 - 1.8 ng/dL    Diabetic Labs (most recent): Lab Results  Component Value Date   HGBA1C 7.1 (H) 01/26/2018   HGBA1C 8.0 (H) 10/06/2017   HGBA1C 6.7 (H) 07/07/2017   Lipid Panel     Component Value Date/Time   CHOL 184 07/07/2017 0928   TRIG 191 (H) 07/07/2017 0928   HDL 42 (L) 07/07/2017 0928   CHOLHDL 4.4 07/07/2017 0928   VLDL 33 (H) 03/13/2016 1015   LDLCALC 111 (H) 07/07/2017 0928    Assessment & Plan:   1. Uncontrolled type 2 diabetes mellitus with complication, with long-term current use of insulin (HCC)  -She returns with better A1c of 7.1% improving from 8% during her last visit.     -Her diabetes is complicated by stage III renal insufficiency, chronic heavy smoking, sedentary lifestyle and patient remains at extremely high risk for more acute and chronic complications of diabetes which include CAD, CVA, CKD, retinopathy, and neuropathy. These are all discussed in detail with the patient.  Recent labs reviewed and discussed with her.   - I have re-counseled the patient on diet management   by adopting a  carbohydrate restricted / protein rich  Diet.  -She admits to dietary indiscretion since her last visit.  -  Suggestion is made for her to avoid simple carbohydrates  from her diet including Cakes, Sweet Desserts / Pastries, Ice Cream, Soda (diet and regular), Sweet Tea, Candies, Chips, Cookies, Store Bought Juices, Alcohol in Excess of  1-2 drinks a day, Artificial Sweeteners, and "Sugar-free" Products. This will help patient to have stable blood glucose profile and potentially avoid unintended weight gain.  - Patient is advised to stick to a routine mealtimes to eat 3 meals  a day and avoid unnecessary snacks ( to snack only to correct hypoglycemia).   - I have approached patient with the following individualized plan to manage diabetes and patient agrees.  -She is advised to continue Tresiba at 70 units nightly associated with strict monitoring of blood glucose 2 times a day-daily before breakfast and at bedtime.  She is advised to continue to hold prandial insulin for now.    -She will be reconsidered for prandial insulin Humalog if her A1c increases to above 8% next visit. -Patient is encouraged to call clinic for blood glucose levels less than 70 or above 200 mg /dl. -She is advised to continue metformin 500 mg p.o. twice daily after breakfast and supper.     - Patient specific target  for A1c; LDL, HDL, Triglycerides, and  Waist Circumference were discussed in detail.  2) BP/HTN: Her blood pressure is not controlled to target.  She is advised to continue her current blood pressure medications including lisinopril 10 mg p.o. daily, hydrochlorothiazide 25 mg p.o. daily.  3) Lipids/HPL: Recent lipid panel showed uncontrolled LDL at 111.   She is advised to continue her atorvastatin 80 mg p.o. nightly, and fenofibrate 160 mg p.o. Daily.  4)  Postsurgical hypothyroidism  -Her previsit thyroid function tests are consistent with appropriate replacement.   -She is advised to continue  levothyroxine 125 mcg p.o. every morning.     - We discussed about correct intake of levothyroxine, at fasting, with water, separated by at least 30 minutes from breakfast, and separated by more than 4 hours from calcium, iron, multivitamins, acid reflux medications (PPIs). -Patient is made aware of the fact that thyroid hormone replacement is needed for life, dose to be adjusted by periodic monitoring of thyroid function tests.   5)  Weight/Diet:   declines to see the dietitian, exercise, and carbohydrates information provided.  6) Chronic Care/Health Maintenance:  -Patient is on ACEI/ARB and Statin medications and encouraged to continue to follow up with Ophthalmology, Podiatrist at least yearly or according to recommendations, and advised to quit smoking. I have recommended yearly flu vaccine and pneumonia vaccination at least every 5 years; moderate intensity exercise for up to 150 minutes weekly; and  sleep for at least 7 hours a day. - I advised patient to maintain close follow up with Joette Catching, MD for primary care needs.  - Time spent with the patient: 25 min, of which >50% was spent in reviewing her blood glucose logs , discussing her hypo- and hyper-glycemic episodes, reviewing her current and  previous labs and insulin doses and developing a plan to avoid hypo- and hyper-glycemia. Please refer to Patient Instructions for Blood Glucose Monitoring and Insulin/Medications Dosing Guide"  in media tab for additional information. Nile Dear participated in the discussions, expressed understanding, and voiced agreement with the above plans.  All questions were answered to her satisfaction. she is encouraged to contact clinic should she have any questions or concerns prior to her return visit.   Follow up plan: -Return in about 4 months (around 06/04/2018) for Meter, and Logs.  Marquis Lunch, MD Phone: [phone]  Fax: [phone]  -  This note was partially dictated with voice  recognition software. Similar sounding words can be transcribed inadequately or may not  be corrected upon review.  02/03/2018, 2:48 PM

## 2018-02-03 NOTE — Patient Instructions (Signed)

## 2018-02-15 ENCOUNTER — Other Ambulatory Visit: Payer: Self-pay | Admitting: "Endocrinology

## 2018-04-05 ENCOUNTER — Other Ambulatory Visit: Payer: Self-pay | Admitting: "Endocrinology

## 2018-04-19 ENCOUNTER — Other Ambulatory Visit: Payer: Self-pay | Admitting: "Endocrinology

## 2018-05-13 ENCOUNTER — Emergency Department (HOSPITAL_COMMUNITY)
Admission: EM | Admit: 2018-05-13 | Discharge: 2018-05-13 | Discharge status: Against Medical Advice | Payer: Medicare Other | Attending: Emergency Medicine | Admitting: Emergency Medicine

## 2018-05-13 ENCOUNTER — Encounter (HOSPITAL_COMMUNITY): Payer: Self-pay | Admitting: Emergency Medicine

## 2018-05-13 ENCOUNTER — Other Ambulatory Visit: Payer: Self-pay

## 2018-05-13 DIAGNOSIS — E119 Type 2 diabetes mellitus without complications: Secondary | ICD-10-CM | POA: Diagnosis not present

## 2018-05-13 DIAGNOSIS — F17210 Nicotine dependence, cigarettes, uncomplicated: Secondary | ICD-10-CM | POA: Diagnosis not present

## 2018-05-13 DIAGNOSIS — Z79899 Other long term (current) drug therapy: Secondary | ICD-10-CM | POA: Diagnosis not present

## 2018-05-13 DIAGNOSIS — I10 Essential (primary) hypertension: Secondary | ICD-10-CM | POA: Diagnosis not present

## 2018-05-13 DIAGNOSIS — E039 Hypothyroidism, unspecified: Secondary | ICD-10-CM | POA: Diagnosis not present

## 2018-05-13 HISTORY — DX: Essential (primary) hypertension: I10

## 2018-05-13 LAB — CBC WITH DIFFERENTIAL/PLATELET
ABS IMMATURE GRANULOCYTES: 0.05 10*3/uL (ref 0.00–0.07)
Basophils Absolute: 0.1 10*3/uL (ref 0.0–0.1)
Basophils Relative: 1 %
Eosinophils Absolute: 0.2 10*3/uL (ref 0.0–0.5)
Eosinophils Relative: 2 %
HCT: 38.3 % (ref 36.0–46.0)
HEMOGLOBIN: 12 g/dL (ref 12.0–15.0)
Immature Granulocytes: 1 %
LYMPHS PCT: 21 %
Lymphs Abs: 2 10*3/uL (ref 0.7–4.0)
MCH: 28.2 pg (ref 26.0–34.0)
MCHC: 31.3 g/dL (ref 30.0–36.0)
MCV: 89.9 fL (ref 80.0–100.0)
Monocytes Absolute: 0.5 10*3/uL (ref 0.1–1.0)
Monocytes Relative: 5 %
NEUTROS PCT: 70 %
Neutro Abs: 6.8 10*3/uL (ref 1.7–7.7)
Platelets: 388 10*3/uL (ref 150–400)
RBC: 4.26 MIL/uL (ref 3.87–5.11)
RDW: 14.1 % (ref 11.5–15.5)
WBC: 9.5 10*3/uL (ref 4.0–10.5)
nRBC: 0 % (ref 0.0–0.2)

## 2018-05-13 LAB — COMPREHENSIVE METABOLIC PANEL
ALT: 32 U/L (ref 0–44)
AST: 21 U/L (ref 15–41)
Albumin: 3.9 g/dL (ref 3.5–5.0)
Alkaline Phosphatase: 45 U/L (ref 38–126)
Anion gap: 10 (ref 5–15)
BUN: 30 mg/dL — ABNORMAL HIGH (ref 8–23)
CO2: 22 mmol/L (ref 22–32)
CREATININE: 0.96 mg/dL (ref 0.44–1.00)
Calcium: 9.5 mg/dL (ref 8.9–10.3)
Chloride: 103 mmol/L (ref 98–111)
GFR calc Af Amer: >60 mL/min (ref >60)
GFR calc non Af Amer: >60 mL/min (ref >60)
Glucose, Bld: 123 mg/dL — ABNORMAL HIGH (ref 70–99)
Potassium: 4.3 mmol/L (ref 3.5–5.1)
Sodium: 135 mmol/L (ref 135–145)
Total Bilirubin: 0.5 mg/dL (ref 0.3–1.2)
Total Protein: 7.3 g/dL (ref 6.5–8.1)

## 2018-05-13 MED ORDER — DIPHENHYDRAMINE HCL 50 MG/ML IJ SOLN
25.0000 mg | Freq: Once | INTRAMUSCULAR | Status: AC
  Administered 2018-05-13: 25 mg via INTRAVENOUS
  Filled 2018-05-13: qty 1

## 2018-05-13 MED ORDER — METOCLOPRAMIDE HCL 5 MG/ML IJ SOLN
10.0000 mg | Freq: Once | INTRAMUSCULAR | Status: AC
  Administered 2018-05-13: 10 mg via INTRAVENOUS
  Filled 2018-05-13: qty 2

## 2018-05-13 MED ORDER — KETOROLAC TROMETHAMINE 30 MG/ML IJ SOLN
30.0000 mg | Freq: Once | INTRAMUSCULAR | Status: AC
  Administered 2018-05-13: 30 mg via INTRAVENOUS
  Filled 2018-05-13: qty 1

## 2018-05-13 NOTE — ED Triage Notes (Signed)
Patient reports elevated BP and headache. Patient states she has been having headaches for a couple of months. Has seen PCP and been placed on BP meds. Pressure elevated today, called MD, was sent to ED for eval.

## 2018-05-13 NOTE — ED Notes (Signed)
Pt reports she can't wait any longer. Pt took out her own IV and walked up to nurses station. Pt does  Not want to wait to be re-evaluated by EDP. Pt signing out AMA

## 2018-05-13 NOTE — ED Provider Notes (Signed)
[facility]   CSN: [id number] Arrival date & time: 05/13/18  1629     History   Chief Complaint Chief Complaint  Patient presents with  . Hypertension    HPI Nile DearJeanette M Riggs is a 63 y.o. female.  Patient complains of a headache and poorly controlled blood pressure  The history is provided by the patient. No language interpreter was used.  Hypertension  This is a new problem. The current episode started 2 days ago. The problem occurs constantly. The problem has not changed since onset.Associated symptoms include headaches. Pertinent negatives include no chest pain and no abdominal pain. Nothing aggravates the symptoms. Nothing relieves the symptoms. She has tried nothing for the symptoms. The treatment provided no relief.    Past Medical History:  Diagnosis Date  . Arthritis   . Chronic headaches   . Depression   . Diverticulosis   . Fibromyalgia   . GERD (gastroesophageal reflux disease)   . Goiter   . Hypertension   . Hypothyroidism   . Internal hemorrhoids   . Iron deficiency anemia    Dr. Arlyce DiceKaplan 11/10  . Osteoporosis   . Ruptured lumbar disc   . Type 2 diabetes mellitus [facility](HCC)     Patient Active Problem List   Diagnosis Date Noted  . Calculus of gallbladder without cholecystitis without obstruction   . RUQ pain 10/31/2016  . 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 Hyperlipidemia 03/07/2015  . Essential hypertension, benign 03/07/2015  . History of colonic polyps 09/20/2014  . Postoperative wound hematoma 06/08/2013  . Atypical chest pain 09/06/2010  . Tobacco abuse 09/06/2010  . Type 2 diabetes mellitus with stage 3 chronic kidney disease, with long-term current use of insulin (HCC) 01/31/2009  . Iron deficiency anemia 01/31/2009  . ESOPHAGEAL REFLUX 01/31/2009  . Dysphagia 01/31/2009    Past Surgical History:  Procedure Laterality Date  . Arthroscopic left knee surgery Bilateral   . BACK SURGERY    .  CHOLECYSTECTOMY N/A 11/21/2016   Procedure: LAPAROSCOPIC CHOLECYSTECTOMY;  Surgeon: Franky MachoJenkins, Mark, MD;  Location: [facility];  Service: General;  Laterality: N/A;  . COLONOSCOPY  2016   Dr. Arlyce DiceKaplan: diverticulosis  . DILATION AND CURETTAGE OF UTERUS    . ESOPHAGOGASTRODUODENOSCOPY  2016   Dr. Arlyce DiceKaplan: early GE junction stricture s/p dilation   . THYROIDECTOMY N/A 06/06/2013   Procedure: TOTAL THYROIDECTOMY;  Surgeon: Dalia HeadingMark A Jenkins, MD;  Location: [facility];  Service: General;  Laterality: N/A;     OB History    Gravida  4   Para  2   Term  2   Preterm      AB  2   Living        SAB  2   TAB      Ectopic      Multiple      Live Births               Home Medications    Prior to Admission medications   Medication Sig Start Date End Date Taking? Authorizing Provider  alendronate (FOSAMAX) 70 MG tablet Take 10 mg by mouth once a week.   Yes [provider]  amLODipine (NORVASC) 5 MG tablet Take 1 tablet by mouth daily. 04/29/18  Yes [provider]  Artificial Tear Ointment (DRY EYES OP) Apply 1 drop to eye 2 (two) times daily.   Yes [provider]  atorvastatin (LIPITOR) 80 MG tablet Take 80 mg by mouth daily.  Yes [provider]  Calcium Carbonate-Vit D-Min (CALCIUM 1200 PO) Take 1 capsule by mouth daily.   Yes [provider]  esomeprazole (NEXIUM) 40 MG capsule Take 40 mg by mouth daily.  01/25/16  Yes [provider]  fenofibrate 160 MG tablet Take 160 mg by mouth daily.  01/25/16  Yes [provider]  fexofenadine (ALLEGRA) 180 MG tablet Take 180 mg by mouth daily.     Yes [provider]  gabapentin (NEURONTIN) 300 MG capsule Take 300 mg by mouth 3 (three) times daily.   Yes [provider]  hydrochlorothiazide (HYDRODIURIL) 25 MG tablet Take 25 mg by mouth daily.   Yes [provider]  insulin degludec (TRESIBA FLEXTOUCH) 100 UNIT/ML SOPN FlexTouch Pen Inject 0.7 mLs (70 Units  total) into the skin at bedtime. 10/15/17  Yes Nida, Denman GeorgeGebreselassie W, MD  levothyroxine (SYNTHROID, LEVOTHROID) 125 MCG tablet TAKE ONE TABLET EVERY MORNING 04/06/18  Yes Nida, Denman GeorgeGebreselassie W, MD  metFORMIN (GLUCOPHAGE-XR) 500 MG 24 hr tablet TAKE ONE TABLET BY MOUTH TWICE DAILY 04/06/18  Yes Nida, Denman GeorgeGebreselassie W, MD  POTASSIUM PO Take 1 tablet by mouth daily.   Yes [provider]  promethazine (PHENERGAN) 25 MG suppository Place 1 suppository (25 mg total) rectally every 6 (six) hours as needed for nausea or vomiting. 11/14/16  Yes Samuel JesterMcManus, Kathleen, DO  quinapril (ACCUPRIL) 10 MG tablet Take 10 mg by mouth daily.  01/25/16  Yes [provider]  meloxicam (MOBIC) 7.5 MG tablet Take 7.5 mg by mouth 2 (two) times daily.     [provider]  Dola ArgyleNETOUCH DELICA LANCETS 33G MISC 4 TIMES DAILY 01/23/17   Roma KayserNida, Gebreselassie W, MD  [facility] LP[facility]OUCH VERIO test strip 4 TIMES DAILY 10/05/17   Roma KayserNida, Gebreselassie W, MD  rOPINIRole (REQUIP) 1 MG tablet Take by mouth at bedtime. 01/06/17   [provider]  TRESIBA FLEXTOUCH 100 UNIT/ML SOPN FlexTouch Pen INJECT 0.7ML (70 UNITS) SQ AT BEDTIME 04/19/18   Nida, Denman GeorgeGebreselassie W, MD  ULTICARE SHORT PEN NEEDLES 31G X 8 MM MISC 4 TIMES DAILY 04/29/17   Roma KayserNida, Gebreselassie W, MD    Family History Family History  Problem Relation Age of Onset  . Diabetes type II Mother   . Alzheimer's disease Mother   . Diabetes type II Sister   . Diabetes Sister   . Diabetes type II Father   . Cancer Father        Lung  . Diabetes Brother   . Diabetes Sister   . Colon cancer Neg Hx     Social History Social History   Tobacco Use  . Smoking status: Current Every Day Smoker    Packs/day: 0.50    Years: 20.00    Pack years: 10.00    Types: Cigarettes    Last attempt to quit: 04/27/2015    Years since quitting: 3.0  . Smokeless tobacco: Never Used  Substance Use Topics  . Alcohol use: No    Alcohol/week: 0.0 standard drinks  . Drug use: No      Allergies   Aspirin; Flonase [fluticasone propionate]; Prednisone; and Tramadol   Review of Systems Review of Systems  Constitutional: Negative for appetite change and fatigue.  HENT: Negative for congestion, ear discharge and sinus pressure.   Eyes: Negative for discharge.  Respiratory: Negative for cough.   Cardiovascular: Negative for chest pain.  Gastrointestinal: Negative for abdominal pain and diarrhea.  Genitourinary: Negative for frequency and hematuria.  Musculoskeletal: Negative for back pain.  Skin: Negative for rash.  Neurological: Positive for headaches. Negative for seizures.  Psychiatric/Behavioral: Negative for hallucinations.     Physical Exam Updated Vital Signs BP (!) 157/84   Pulse 79   Temp (!) 97.5 F (36.4 C) (Oral)   Resp 18   Ht 5\' 3"  (1.6 m)   Wt 82.1 kg   SpO2 94%   BMI 32.06 kg/m   Physical Exam Vitals signs and nursing note reviewed.  Constitutional:      Appearance: She is well-developed.  HENT:     Head: Normocephalic.     Nose: Nose normal.  Eyes:     General: No scleral icterus.    Conjunctiva/sclera: Conjunctivae normal.  Neck:     Musculoskeletal: Neck supple.     Thyroid: No thyromegaly.  Cardiovascular:     Rate and Rhythm: Normal rate and regular rhythm.     Heart sounds: No murmur. No friction rub. No gallop.   Pulmonary:     Breath sounds: No stridor. No wheezing or rales.  Chest:     Chest wall: No tenderness.  Abdominal:     General: There is no distension.     Tenderness: There is no abdominal tenderness. There is no rebound.  Musculoskeletal: Normal range of motion.  Lymphadenopathy:     Cervical: No cervical adenopathy.  Skin:    Findings: No erythema or rash.  Neurological:     Mental Status: She is oriented to person, place, and time.     Motor: No abnormal muscle tone.     Coordination: Coordination normal.  Psychiatric:        Behavior: Behavior normal.      ED Treatments / Results   Labs (all labs ordered are listed, but only abnormal results are displayed) Labs Reviewed  COMPREHENSIVE METABOLIC PANEL - Abnormal; Notable for the following components:      Result Value   Glucose, Bld 123 (*)    BUN 30 (*)    All other components within normal limits  CBC WITH DIFFERENTIAL/PLATELET    EKG None  Radiology No results found.  Procedures Procedures (including critical care time)  Medications Ordered in ED Medications  ketorolac (TORADOL) 30 MG/ML injection 30 mg (30 mg Intravenous Given 05/13/18 1751)  diphenhydrAMINE (BENADRYL) injection 25 mg (25 mg Intravenous Given 05/13/18 1752)  metoCLOPramide (REGLAN) injection 10 mg (10 mg Intravenous Given 05/13/18 1752)     Initial Impression / Assessment and Plan / ED Course  I have reviewed the triage vital signs and the nursing notes.  Pertinent labs & imaging results that were available during my care of the patient were reviewed by me and considered in my medical decision making (see chart for details).    Patient's headache improved with migraine cocktail.  Labs unremarkable.  Patient left AMA before I was able to discuss her blood pressure medicines and go over her lab work with her  Final Clinical Impressions(s) / ED Diagnoses   Final diagnoses:  Essential hypertension    ED Discharge Orders    None       Bethann Berkshire, MD 05/13/18 1927

## 2018-05-24 ENCOUNTER — Observation Stay (HOSPITAL_COMMUNITY)
Admission: EM | Admit: 2018-05-24 | Discharge: 2018-05-25 | Disposition: A | Discharge status: Home / Self Care | Payer: Medicare Other | Attending: Internal Medicine | Admitting: Internal Medicine

## 2018-05-24 ENCOUNTER — Other Ambulatory Visit: Payer: Self-pay

## 2018-05-24 ENCOUNTER — Emergency Department (HOSPITAL_COMMUNITY): Payer: Medicare Other

## 2018-05-24 ENCOUNTER — Encounter (HOSPITAL_COMMUNITY): Payer: Self-pay | Admitting: Emergency Medicine

## 2018-05-24 DIAGNOSIS — I129 Hypertensive chronic kidney disease with stage 1 through stage 4 chronic kidney disease, or unspecified chronic kidney disease: Secondary | ICD-10-CM | POA: Diagnosis not present

## 2018-05-24 DIAGNOSIS — E1122 Type 2 diabetes mellitus with diabetic chronic kidney disease: Secondary | ICD-10-CM

## 2018-05-24 DIAGNOSIS — E039 Hypothyroidism, unspecified: Secondary | ICD-10-CM | POA: Insufficient documentation

## 2018-05-24 DIAGNOSIS — F17210 Nicotine dependence, cigarettes, uncomplicated: Secondary | ICD-10-CM | POA: Diagnosis not present

## 2018-05-24 DIAGNOSIS — E119 Type 2 diabetes mellitus without complications: Secondary | ICD-10-CM | POA: Insufficient documentation

## 2018-05-24 DIAGNOSIS — I10 Essential (primary) hypertension: Secondary | ICD-10-CM | POA: Diagnosis present

## 2018-05-24 DIAGNOSIS — Z79899 Other long term (current) drug therapy: Secondary | ICD-10-CM | POA: Diagnosis not present

## 2018-05-24 DIAGNOSIS — Z794 Long term (current) use of insulin: Secondary | ICD-10-CM | POA: Insufficient documentation

## 2018-05-24 DIAGNOSIS — K219 Gastro-esophageal reflux disease without esophagitis: Secondary | ICD-10-CM | POA: Diagnosis not present

## 2018-05-24 DIAGNOSIS — K922 Gastrointestinal hemorrhage, unspecified: Secondary | ICD-10-CM

## 2018-05-24 DIAGNOSIS — J209 Acute bronchitis, unspecified: Secondary | ICD-10-CM | POA: Diagnosis not present

## 2018-05-24 DIAGNOSIS — R05 Cough: Secondary | ICD-10-CM | POA: Diagnosis present

## 2018-05-24 DIAGNOSIS — J40 Bronchitis, not specified as acute or chronic: Secondary | ICD-10-CM | POA: Diagnosis not present

## 2018-05-24 DIAGNOSIS — Z720 Tobacco use: Secondary | ICD-10-CM | POA: Diagnosis present

## 2018-05-24 DIAGNOSIS — N183 Chronic kidney disease, stage 3 (moderate): Secondary | ICD-10-CM

## 2018-05-24 DIAGNOSIS — N182 Chronic kidney disease, stage 2 (mild): Secondary | ICD-10-CM | POA: Diagnosis not present

## 2018-05-24 DIAGNOSIS — R195 Other fecal abnormalities: Secondary | ICD-10-CM

## 2018-05-24 DIAGNOSIS — N1831 Chronic kidney disease, stage 3a: Secondary | ICD-10-CM

## 2018-05-24 LAB — BASIC METABOLIC PANEL
ANION GAP: 15 (ref 5–15)
BUN: 30 mg/dL — ABNORMAL HIGH (ref 8–23)
CHLORIDE: 94 mmol/L — AB (ref 98–111)
CO2: 22 mmol/L (ref 22–32)
Calcium: 8.4 mg/dL — ABNORMAL LOW (ref 8.9–10.3)
Creatinine, Ser: 1.5 mg/dL — ABNORMAL HIGH (ref 0.44–1.00)
GFR calc Af Amer: 43 mL/min — ABNORMAL LOW (ref >60)
GFR calc non Af Amer: 37 mL/min — ABNORMAL LOW (ref >60)
Glucose, Bld: 332 mg/dL — ABNORMAL HIGH (ref 70–99)
Potassium: 3.9 mmol/L (ref 3.5–5.1)
Sodium: 131 mmol/L — ABNORMAL LOW (ref 135–145)

## 2018-05-24 LAB — CBC WITH DIFFERENTIAL/PLATELET
Abs Immature Granulocytes: 0.02 10*3/uL (ref 0.00–0.07)
Basophils Absolute: 0 10*3/uL (ref 0.0–0.1)
Basophils Relative: 0 %
Eosinophils Absolute: 0 10*3/uL (ref 0.0–0.5)
Eosinophils Relative: 0 %
HCT: 41.8 % (ref 36.0–46.0)
HEMOGLOBIN: 13.1 g/dL (ref 12.0–15.0)
Immature Granulocytes: 0 %
Lymphocytes Relative: 16 %
Lymphs Abs: 1.2 10*3/uL (ref 0.7–4.0)
MCH: 27.8 pg (ref 26.0–34.0)
MCHC: 31.3 g/dL (ref 30.0–36.0)
MCV: 88.6 fL (ref 80.0–100.0)
MONOS PCT: 7 %
Monocytes Absolute: 0.5 10*3/uL (ref 0.1–1.0)
Neutro Abs: 5.6 10*3/uL (ref 1.7–7.7)
Neutrophils Relative %: 77 %
Platelets: 301 10*3/uL (ref 150–400)
RBC: 4.72 MIL/uL (ref 3.87–5.11)
RDW: 14.2 % (ref 11.5–15.5)
WBC: 7.3 10*3/uL (ref 4.0–10.5)
nRBC: 0 % (ref 0.0–0.2)

## 2018-05-24 LAB — OCCULT BLOOD, POC DEVICE: Fecal Occult Bld: POSITIVE — AB

## 2018-05-24 LAB — HEPATIC FUNCTION PANEL
ALK PHOS: 56 U/L (ref 38–126)
ALT: 52 U/L — AB (ref 0–44)
AST: 36 U/L (ref 15–41)
Albumin: 3.6 g/dL (ref 3.5–5.0)
Bilirubin, Direct: 0.1 mg/dL (ref 0.0–0.2)
Indirect Bilirubin: 0.4 mg/dL (ref 0.3–0.9)
Total Bilirubin: 0.5 mg/dL (ref 0.3–1.2)
Total Protein: 7.2 g/dL (ref 6.5–8.1)

## 2018-05-24 LAB — HEMOGLOBIN AND HEMATOCRIT, BLOOD
HCT: 35.1 % — ABNORMAL LOW (ref 36.0–46.0)
Hemoglobin: 11.4 g/dL — ABNORMAL LOW (ref 12.0–15.0)

## 2018-05-24 LAB — TROPONIN I: Troponin I: <0.03 ng/mL (ref <0.03)

## 2018-05-24 LAB — GLUCOSE, CAPILLARY: Glucose-Capillary: 343 mg/dL — ABNORMAL HIGH (ref 70–99)

## 2018-05-24 LAB — MAGNESIUM: MAGNESIUM: 1.5 mg/dL — AB (ref 1.7–2.4)

## 2018-05-24 LAB — INFLUENZA PANEL BY PCR (TYPE A & B)
INFLBPCR: NEGATIVE
Influenza A By PCR: POSITIVE — AB

## 2018-05-24 LAB — LIPASE, BLOOD: Lipase: 43 U/L (ref 11–51)

## 2018-05-24 MED ORDER — SODIUM CHLORIDE 0.9 % IV SOLN
8.0000 mg/h | INTRAVENOUS | Status: DC
  Administered 2018-05-25: 8 mg/h via INTRAVENOUS
  Filled 2018-05-24 (×5): qty 80

## 2018-05-24 MED ORDER — INSULIN ASPART 100 UNIT/ML ~~LOC~~ SOLN
0.0000 [IU] | SUBCUTANEOUS | Status: DC
  Administered 2018-05-24: 11 [IU] via SUBCUTANEOUS
  Administered 2018-05-25 (×2): 8 [IU] via SUBCUTANEOUS
  Administered 2018-05-25: 3 [IU] via SUBCUTANEOUS
  Administered 2018-05-25: 15 [IU] via SUBCUTANEOUS

## 2018-05-24 MED ORDER — OSELTAMIVIR PHOSPHATE 75 MG PO CAPS: 75.0000 mg | ORAL_CAPSULE | Freq: Two times a day (BID) | ORAL | Status: DC

## 2018-05-24 MED ORDER — IPRATROPIUM-ALBUTEROL 0.5-2.5 (3) MG/3ML IN SOLN
3.0000 mL | Freq: Four times a day (QID) | RESPIRATORY_TRACT | Status: DC
  Administered 2018-05-24: 3 mL via RESPIRATORY_TRACT
  Filled 2018-05-24: qty 3

## 2018-05-24 MED ORDER — ROPINIROLE HCL 1 MG PO TABS
2.0000 mg | ORAL_TABLET | Freq: Every day | ORAL | Status: DC
  Administered 2018-05-24: 4 mg via ORAL
  Filled 2018-05-24: qty 4

## 2018-05-24 MED ORDER — MAGNESIUM SULFATE 2 GM/50ML IV SOLN
2.0000 g | Freq: Once | INTRAVENOUS | Status: AC
  Administered 2018-05-24: 2 g via INTRAVENOUS
  Filled 2018-05-24: qty 50

## 2018-05-24 MED ORDER — AZITHROMYCIN 250 MG PO TABS: 500.0000 mg | ORAL_TABLET | Freq: Every day | ORAL | Status: DC

## 2018-05-24 MED ORDER — SODIUM CHLORIDE 0.9 % IV SOLN
8.0000 mg/h | INTRAVENOUS | Status: DC
  Administered 2018-05-24: 8 mg/h via INTRAVENOUS
  Filled 2018-05-24 (×3): qty 80

## 2018-05-24 MED ORDER — LISINOPRIL 10 MG PO TABS
10.0000 mg | ORAL_TABLET | Freq: Every day | ORAL | Status: DC
  Administered 2018-05-25: 10 mg via ORAL
  Filled 2018-05-24: qty 1

## 2018-05-24 MED ORDER — DIPHENHYDRAMINE HCL 25 MG PO CAPS: 25.0000 mg | ORAL_CAPSULE | Freq: Three times a day (TID) | ORAL | Status: DC | PRN

## 2018-05-24 MED ORDER — DIPHENHYDRAMINE HCL 50 MG/ML IJ SOLN: 12.5000 mg | Freq: Three times a day (TID) | INTRAMUSCULAR | Status: DC | PRN

## 2018-05-24 MED ORDER — ONDANSETRON HCL 4 MG/2ML IJ SOLN: 4.0000 mg | Freq: Four times a day (QID) | INTRAMUSCULAR | Status: DC | PRN

## 2018-05-24 MED ORDER — SODIUM CHLORIDE 0.9 % IV BOLUS
500.0000 mL | Freq: Once | INTRAVENOUS | Status: AC
  Administered 2018-05-24: 500 mL via INTRAVENOUS

## 2018-05-24 MED ORDER — METHYLPREDNISOLONE SODIUM SUCC 125 MG IJ SOLR
60.0000 mg | Freq: Two times a day (BID) | INTRAMUSCULAR | Status: DC
  Administered 2018-05-24 – 2018-05-25 (×2): 60 mg via INTRAVENOUS
  Filled 2018-05-24 (×2): qty 2

## 2018-05-24 MED ORDER — PANTOPRAZOLE SODIUM 40 MG IV SOLR: 40.0000 mg | Freq: Two times a day (BID) | INTRAVENOUS | Status: DC

## 2018-05-24 MED ORDER — IPRATROPIUM-ALBUTEROL 0.5-2.5 (3) MG/3ML IN SOLN
3.0000 mL | Freq: Three times a day (TID) | RESPIRATORY_TRACT | Status: DC
  Administered 2018-05-25 (×2): 3 mL via RESPIRATORY_TRACT
  Filled 2018-05-24 (×2): qty 3

## 2018-05-24 MED ORDER — ACETAMINOPHEN 325 MG PO TABS: 650.0000 mg | ORAL_TABLET | Freq: Four times a day (QID) | ORAL | Status: DC | PRN

## 2018-05-24 MED ORDER — ACETAMINOPHEN 650 MG RE SUPP: 650.0000 mg | Freq: Four times a day (QID) | RECTAL | Status: DC | PRN

## 2018-05-24 MED ORDER — INSULIN GLARGINE 100 UNIT/ML ~~LOC~~ SOLN
20.0000 [IU] | Freq: Every day | SUBCUTANEOUS | Status: DC
  Administered 2018-05-24: 20 [IU] via SUBCUTANEOUS
  Filled 2018-05-24 (×2): qty 0.2

## 2018-05-24 MED ORDER — IPRATROPIUM-ALBUTEROL 0.5-2.5 (3) MG/3ML IN SOLN: 3.0000 mL | RESPIRATORY_TRACT | Status: DC | PRN

## 2018-05-24 MED ORDER — LEVALBUTEROL HCL 0.63 MG/3ML IN NEBU
0.6300 mg | INHALATION_SOLUTION | Freq: Once | RESPIRATORY_TRACT | Status: AC
  Administered 2018-05-24: 0.63 mg via RESPIRATORY_TRACT
  Filled 2018-05-24: qty 3

## 2018-05-24 MED ORDER — TRAMADOL HCL 50 MG PO TABS
50.0000 mg | ORAL_TABLET | Freq: Once | ORAL | Status: AC
  Administered 2018-05-24: 50 mg via ORAL
  Filled 2018-05-24: qty 1

## 2018-05-24 MED ORDER — POTASSIUM CHLORIDE IN NACL 20-0.9 MEQ/L-% IV SOLN
INTRAVENOUS | Status: AC
  Administered 2018-05-24: 23:00:00 via INTRAVENOUS

## 2018-05-24 MED ORDER — LEVOTHYROXINE SODIUM 25 MCG PO TABS
125.0000 ug | ORAL_TABLET | Freq: Every day | ORAL | Status: DC
  Administered 2018-05-25: 125 ug via ORAL
  Filled 2018-05-24: qty 1

## 2018-05-24 MED ORDER — ONDANSETRON HCL 4 MG PO TABS: 4.0000 mg | ORAL_TABLET | Freq: Four times a day (QID) | ORAL | Status: DC | PRN

## 2018-05-24 MED ORDER — TRAZODONE HCL 50 MG PO TABS
300.0000 mg | ORAL_TABLET | Freq: Every day | ORAL | Status: DC
  Administered 2018-05-24: 300 mg via ORAL
  Filled 2018-05-24: qty 6

## 2018-05-24 MED ORDER — SODIUM CHLORIDE 0.9 % IV SOLN
80.0000 mg | Freq: Once | INTRAVENOUS | Status: AC
  Administered 2018-05-24: 80 mg via INTRAVENOUS
  Filled 2018-05-24: qty 80

## 2018-05-24 MED ORDER — OSELTAMIVIR PHOSPHATE 30 MG PO CAPS
30.0000 mg | ORAL_CAPSULE | Freq: Two times a day (BID) | ORAL | Status: DC
  Administered 2018-05-24 – 2018-05-25 (×2): 30 mg via ORAL
  Filled 2018-05-24 (×2): qty 1

## 2018-05-24 NOTE — Progress Notes (Signed)
Patient admitted to [facility] with the diagnosis of bronchitis. A & O x 4. Denied any acute pain at this moment. Patient oriented to her room, staff and ascom/call bell. Full assessment to epic completed. Husband at bedside voiced no concern.  Will continue to monitor.

## 2018-05-24 NOTE — H&P (Addendum)
History and Physical    Linda Riggs:[id number] DOB: [birth year] DOA: 05/24/2018  PCP: Joette Catching, MD   Patient coming from: Home  I have personally briefly reviewed patient's old medical records in [facility]  Chief Complaint: Cough, SOB, Black stools  HPI: Linda Riggs is a 63 y.o. female with medical history significant for DM2, HTN, Hypothyroidism, Depression, diverticulosis, who presented to the Ed with c/o of cough of about 1 week duration, she also reports some heavy breathing over the past 2 days, and generalized body aches. She denies fevers.  No Chest pain.   She also reports multiple episodes of black loose stools over the past 2-3 days, up to 10 times a day, so far 5 today.  No abdominal pain.  No vomiting. Patient endorses NSAID use, ~10 dose of Excedrin Migraine's over the past week.  No prior episodes of GI bleed.  She has had both colonoscopy and EGD 2016.  ED Course: Stable vitals blood pressure in the 140s.  O2 sats greater than 91% on room air.  Hgb at baseline 13.  Positive FOBT.  Creatinine stable at 1.5.  Two-view chest x-ray negative for acute abnormality.  1L bolus given. Patient was started on Protonix drip with 80 mg bolus.  Hospitalist to admit for possible GI bleed.  Review of Systems: As per HPI all other systems reviewed and negative.  Past Medical History:  Diagnosis Date  . Arthritis   . Chronic headaches   . Depression   . Diverticulosis   . Fibromyalgia   . GERD (gastroesophageal reflux disease)   . Goiter   . Hypertension   . Hypothyroidism   . Internal hemorrhoids   . Iron deficiency anemia    Dr. Arlyce Dice 11/10  . Osteoporosis   . Ruptured lumbar disc   . Type 2 diabetes mellitus (HCC)     Past Surgical History:  Procedure Laterality Date  . Arthroscopic left knee surgery Bilateral   . BACK SURGERY    . CHOLECYSTECTOMY N/A 11/21/2016   Procedure: LAPAROSCOPIC CHOLECYSTECTOMY;  Surgeon: Franky Macho, MD;  Location: [facility];  Service: General;  Laterality: N/A;  . COLONOSCOPY  2016   Dr. Arlyce Dice: diverticulosis  . DILATION AND CURETTAGE OF UTERUS    . ESOPHAGOGASTRODUODENOSCOPY  2016   Dr. Arlyce Dice: early GE junction stricture s/p dilation   . THYROIDECTOMY N/A 06/06/2013   Procedure: TOTAL THYROIDECTOMY;  Surgeon: Dalia Heading, MD;  Location: [facility];  Service: General;  Laterality: N/A;     reports that she has been smoking cigarettes. She has a 10.00 pack-year smoking history. She has never used smokeless tobacco. She reports that she does not drink alcohol or use drugs.  Allergies  Allergen Reactions  . Aspirin Itching  . Flonase [Fluticasone Propionate] Itching  . Prednisone Itching  . Tramadol Itching    Family History  Problem Relation Age of Onset  . Diabetes type II Mother   . Alzheimer's disease Mother   . Diabetes type II Sister   . Diabetes Sister   . Diabetes type II Father   . Cancer Father        Lung  . Diabetes Brother   . Diabetes Sister   . Colon cancer Neg Hx     Prior to Admission medications   Medication Sig Start Date End Date Taking? Authorizing Provider  amLODipine (NORVASC) 5 MG tablet Take 1 tablet by mouth daily. 04/29/18  Yes [provider]  Artificial Tear  Ointment (DRY EYES OP) Apply 1 drop to eye 2 (two) times daily.   Yes [provider]  esomeprazole (NEXIUM) 40 MG capsule Take 40 mg by mouth daily.  01/25/16  Yes [provider]  fexofenadine (ALLEGRA) 180 MG tablet Take 180 mg by mouth daily.     Yes [provider]  hydrochlorothiazide (HYDRODIURIL) 25 MG tablet Take 25 mg by mouth daily.   Yes [provider]  insulin degludec (TRESIBA FLEXTOUCH) 100 UNIT/ML SOPN FlexTouch Pen Inject 0.7 mLs (70 Units total) into the skin at bedtime. 10/15/17  Yes Nida, Denman George, MD  levothyroxine (SYNTHROID, LEVOTHROID) 125 MCG tablet TAKE ONE TABLET EVERY MORNING Patient taking differently: Take 125 mcg by mouth daily  before breakfast.  04/06/18  Yes Nida, Denman George, MD  metFORMIN (GLUCOPHAGE-XR) 500 MG 24 hr tablet TAKE ONE TABLET BY MOUTH TWICE DAILY Patient taking differently: Take 500 mg by mouth 2 (two) times daily.  04/06/18  Yes Nida, Denman George, MD  ONETOUCH DELICA LANCETS 33G MISC 4 TIMES DAILY 01/23/17  Yes Roma Kayser, MD  [facility] VERIO test strip 4 TIMES DAILY 10/05/17  Yes Roma Kayser, MD  quinapril (ACCUPRIL) 10 MG tablet Take 10 mg by mouth daily.  01/25/16  Yes [provider]  rOPINIRole (REQUIP) 2 MG tablet Take 2-4 mg by mouth at bedtime.  01/06/17  Yes [provider]  traZODone (DESYREL) 100 MG tablet Take 300 mg by mouth at bedtime.   Yes [provider]  ULTICARE SHORT PEN NEEDLES 31G X 8 MM MISC 4 TIMES DAILY 04/29/17  Yes Roma Kayser, MD    Physical Exam: Vitals:   05/24/18 1445 05/24/18 1500 05/24/18 1545 05/24/18 1600  BP:  (!) 148/71  95/68  Pulse: 94 89 94 94  Resp: Temp:      TempSrc:      SpO2: 92% 94% 91% 93%  Weight:      Height:        Constitutional: NAD, calm, comfortable Vitals:   05/24/18 1445 05/24/18 1500 05/24/18 1545 05/24/18 1600  BP:  (!) 148/71  95/68  Pulse: 94 89 94 94  Resp: Temp:      TempSrc:      SpO2: 92% 94% 91% 93%  Weight:      Height:       Eyes: PERRL, lids and conjunctivae normal ENMT: Mucous membranes are dry. Posterior pharynx clear of any exudate or lesions. Neck: normal, supple, no masses, no thyromegaly Respiratory: Mild expiratory wheeze right and left lower lobe, no crackles. Normal respiratory effort. No accessory muscle use.  Cardiovascular: Regular rate and rhythm, no murmurs / rubs / gallops. No extremity edema. 2+ pedal pulses.  Abdomen: no tenderness, no masses palpated. No hepatosplenomegaly. Bowel sounds positive.  Musculoskeletal: no clubbing / cyanosis. No joint deformity upper and lower extremities. Good ROM, no contractures.   Skin: no rashes, lesions, ulcers. No induration Neurologic: CN 2-12 grossly intact.  Strength 5/5 in all 4.  Psychiatric: Normal judgment and insight. Alert and oriented x 3. Normal mood.   Labs on Admission: I have personally reviewed following labs and imaging studies  CBC: Recent Labs  Lab 05/24/18 1353  WBC 7.3  NEUTROABS 5.6  HGB 13.1  HCT 41.8  MCV 88.6  PLT 301   Basic Metabolic Panel: Recent Labs  Lab 05/24/18 1353  NA 131*  K 3.9  CL 94*  CO2  22  GLUCOSE 332*  BUN 30*  CREATININE 1.50*  CALCIUM 8.4*   Liver Function Tests: Recent Labs  Lab 05/24/18 1353  AST 36  ALT 52*  ALKPHOS 56  BILITOT 0.5  PROT 7.2  ALBUMIN 3.6   Recent Labs  Lab 05/24/18 1353  LIPASE 43   Cardiac Enzymes: Recent Labs  Lab 05/24/18 1353  TROPONINI <0.03    Radiological Exams on Admission: Dg Chest 2 View  Result Date: 05/24/2018 CLINICAL DATA:  Cough. EXAM: CHEST - 2 VIEW COMPARISON:  Chest x-ray dated May 05, 2016. FINDINGS: The heart size and mediastinal contours are within normal limits. Normal pulmonary vascularity. No focal consolidation, pleural effusion, or pneumothorax. No acute osseous abnormality. IMPRESSION: No active cardiopulmonary disease. Electronically Signed   By: Obie Dredge M.D.   On: 05/24/2018 13:19    EKG: Independently reviewed. SINUS. RBBB, LAFB. No significant from prior EKG.  Assessment/Plan Principal Problem:   Acute bronchitis  Acute Bronchitis- Cough, SOB, Wheeze.  Two-view chest x-ray negative for acute abnormality.  WBC 7.3. -IV Solu-Medrol 60 twice daily -DuoNeb scheduled PRN -Mucolytics -Influenza PCR panel- Positive, Tamiflu started  Diarrhea-with multiple black stools.  No abdominal pain.  Vitals stable.  Hemoglobin stable 13.  FOBT positive. - Protonix gtt already started in ED, will continue -Stool C. Difficile -Hydrate N/s +20kcl 75cc/hr x 15 hrs -CBC a.m. -Consider GI evaluation outpatient versus inpatient  pending -clinical course, stable hgb. -Clear liquid diet, n.p.o. midnight - Check Mag low - 1.5, replete, recheck a.m  DM 2- hgba1c-7.1. Glucose 332. Evaristo Bury at reduced dose 20u daily for now - SSI  HTN- Stable. - Cont Lisinopril 10 - Hold Norvasc 5 and HCTZ 25 in the setting of possible GI hemorrhage  DVT prophylaxis: Scds Code Status: Full Family Communication:  Disposition Plan: 1-2 days Consults called: None Admission status: Obs, Tele   Onnie Boer MD [facility]  05/24/2018, 5:46 PM

## 2018-05-24 NOTE — ED Provider Notes (Signed)
[facility] EMERGENCY DEPARTMENT Provider Note   CSN: [id number] Arrival date & time: 05/24/18  1245    History   Chief Complaint Chief Complaint  Patient presents with  . Cough    HPI ANNDREA Riggs is a 63 y.o. female.     HPI Patient has had 1 week of nasal congestion, "hacking" cough and shortness of breath.  She is been taking over-the-counter Excedrin Migraine and Tylenol sinus medication.  No fever or chills.  For the past 2 or 3 days she has had multiple(>10) episodes of dark loose stool.  She is had minimal nausea but no vomiting.  Patient has history of gastroesophageal reflux disease.  She has some upper abdominal discomfort that radiates into her chest.  States she is had previous cholecystectomy. Past Medical History:  Diagnosis Date  . Arthritis   . Chronic headaches   . Depression   . Diverticulosis   . Fibromyalgia   . GERD (gastroesophageal reflux disease)   . Goiter   . Hypertension   . Hypothyroidism   . Internal hemorrhoids   . Iron deficiency anemia    Dr. Arlyce Dice 11/10  . Osteoporosis   . Ruptured lumbar disc   . Type 2 diabetes mellitus [facility])     Patient Active Problem List   Diagnosis Date Noted  . Calculus of gallbladder without cholecystitis without obstruction   . RUQ pain 10/31/2016  . 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 Hyperlipidemia 03/07/2015  . Essential hypertension, benign 03/07/2015  . History of colonic polyps 09/20/2014  . Postoperative wound hematoma 06/08/2013  . Atypical chest pain 09/06/2010  . Tobacco abuse 09/06/2010  . Type 2 diabetes mellitus with stage 3 chronic kidney disease, with long-term current use of insulin (HCC) 01/31/2009  . Iron deficiency anemia 01/31/2009  . ESOPHAGEAL REFLUX 01/31/2009  . Dysphagia 01/31/2009    Past Surgical History:  Procedure Laterality Date  . Arthroscopic left knee surgery Bilateral   . BACK SURGERY    . CHOLECYSTECTOMY N/A 11/21/2016   Procedure: LAPAROSCOPIC  CHOLECYSTECTOMY;  Surgeon: Franky Macho, MD;  Location: [facility];  Service: General;  Laterality: N/A;  . COLONOSCOPY  2016   Dr. Arlyce Dice: diverticulosis  . DILATION AND CURETTAGE OF UTERUS    . ESOPHAGOGASTRODUODENOSCOPY  2016   Dr. Arlyce Dice: early GE junction stricture s/p dilation   . THYROIDECTOMY N/A 06/06/2013   Procedure: TOTAL THYROIDECTOMY;  Surgeon: Dalia Heading, MD;  Location: [facility];  Service: General;  Laterality: N/A;     OB History    Gravida  4   Para  2   Term  2   Preterm      AB  2   Living        SAB  2   TAB      Ectopic      Multiple      Live Births               Home Medications    Prior to Admission medications   Medication Sig Start Date End Date Taking? Authorizing Provider  amLODipine (NORVASC) 5 MG tablet Take 1 tablet by mouth daily. 04/29/18  Yes [provider]  Artificial Tear Ointment (DRY EYES OP) Apply 1 drop to eye 2 (two) times daily.   Yes [provider]  esomeprazole (NEXIUM) 40 MG capsule Take 40 mg by mouth daily.  01/25/16  Yes [provider]  fexofenadine (ALLEGRA) 180 MG tablet Take 180 mg by mouth daily.  Yes [provider]  hydrochlorothiazide (HYDRODIURIL) 25 MG tablet Take 25 mg by mouth daily.   Yes [provider]  insulin degludec (TRESIBA FLEXTOUCH) 100 UNIT/ML SOPN FlexTouch Pen Inject 0.7 mLs (70 Units total) into the skin at bedtime. 10/15/17  Yes Nida, Denman George, MD  levothyroxine (SYNTHROID, LEVOTHROID) 125 MCG tablet TAKE ONE TABLET EVERY MORNING Patient taking differently: Take 125 mcg by mouth daily before breakfast.  04/06/18  Yes Nida, Denman George, MD  metFORMIN (GLUCOPHAGE-XR) 500 MG 24 hr tablet TAKE ONE TABLET BY MOUTH TWICE DAILY Patient taking differently: Take 500 mg by mouth 2 (two) times daily.  04/06/18  Yes Nida, Denman George, MD  ONETOUCH DELICA LANCETS 33G MISC 4 TIMES DAILY 01/23/17  Yes Roma Kayser, MD  [facility] VERIO test  strip 4 TIMES DAILY 10/05/17  Yes Roma Kayser, MD  quinapril (ACCUPRIL) 10 MG tablet Take 10 mg by mouth daily.  01/25/16  Yes [provider]  rOPINIRole (REQUIP) 2 MG tablet Take 2-4 mg by mouth at bedtime.  01/06/17  Yes [provider]  traZODone (DESYREL) 100 MG tablet Take 300 mg by mouth at bedtime.   Yes [provider]  ULTICARE SHORT PEN NEEDLES 31G X 8 MM MISC 4 TIMES DAILY 04/29/17  Yes Nida, Denman George, MD    Family History Family History  Problem Relation Age of Onset  . Diabetes type II Mother   . Alzheimer's disease Mother   . Diabetes type II Sister   . Diabetes Sister   . Diabetes type II Father   . Cancer Father        Lung  . Diabetes Brother   . Diabetes Sister   . Colon cancer Neg Hx     Social History Social History   Tobacco Use  . Smoking status: Current Every Day Smoker    Packs/day: 0.50    Years: 20.00    Pack years: 10.00    Types: Cigarettes    Last attempt to quit: 04/27/2015    Years since quitting: 3.0  . Smokeless tobacco: Never Used  Substance Use Topics  . Alcohol use: No    Alcohol/week: 0.0 standard drinks  . Drug use: No     Allergies   Aspirin; Flonase [fluticasone propionate]; Prednisone; and Tramadol   Review of Systems Review of Systems  Constitutional: Positive for fatigue. Negative for chills and fever.  HENT: Positive for congestion. Negative for sore throat.   Eyes: Negative for photophobia and visual disturbance.  Respiratory: Positive for cough and shortness of breath.   Cardiovascular: Positive for chest pain. Negative for palpitations and leg swelling.  Gastrointestinal: Positive for abdominal pain, diarrhea and nausea. Negative for blood in stool, constipation and vomiting.  Genitourinary: Negative for dysuria, flank pain, frequency and hematuria.  Musculoskeletal: Negative for back pain, myalgias and neck pain.  Skin: Negative for rash and wound.  Neurological: Negative  for dizziness, weakness, light-headedness, numbness and headaches.  All other systems reviewed and are negative.    Physical Exam Updated Vital Signs BP 95/68   Pulse 94   Temp 98.1 F (36.7 C) (Oral)   Resp 18   Ht 5\' 3"  (1.6 m)   Wt 82.6 kg   SpO2 93%   BMI 32.24 kg/m   Physical Exam Vitals signs and nursing note reviewed.  Constitutional:      General: She is not in acute distress.    Appearance: Normal appearance. She is well-developed. She is not  ill-appearing.  HENT:     Head: Normocephalic and atraumatic.     Nose: Congestion present.     Mouth/Throat:     Mouth: Mucous membranes are moist.     Pharynx: No oropharyngeal exudate or posterior oropharyngeal erythema.  Eyes:     Extraocular Movements: Extraocular movements intact.     Pupils: Pupils are equal, round, and reactive to light.  Neck:     Musculoskeletal: Normal range of motion and neck supple. No neck rigidity or muscular tenderness.  Cardiovascular:     Rate and Rhythm: Normal rate and regular rhythm.     Heart sounds: No murmur. No friction rub. No gallop.   Pulmonary:     Effort: Pulmonary effort is normal.     Breath sounds: Wheezing present.     Comments: Expiratory wheezing throughout. Abdominal:     General: Bowel sounds are normal. There is no distension.     Palpations: Abdomen is soft. There is no mass.     Tenderness: There is abdominal tenderness. There is no right CVA tenderness, left CVA tenderness, guarding or rebound.     Comments: Very mild right upper quadrant tenderness to palpation.  No rebound or guarding.  Musculoskeletal: Normal range of motion.        General: No swelling, tenderness, deformity or signs of injury.     Right lower leg: No edema.     Left lower leg: No edema.     Comments: No midline thoracic or lumbar tenderness.  No CVA tenderness.  No lower extremity swelling, asymmetry or tenderness.  Distal pulses intact.  Lymphadenopathy:     Cervical: No cervical  adenopathy.  Skin:    General: Skin is warm and dry.     Capillary Refill: Capillary refill takes less than 2 seconds.     Findings: No erythema or rash.  Neurological:     General: No focal deficit present.     Mental Status: She is alert and oriented to person, place, and time.  Psychiatric:        Mood and Affect: Mood normal.        Behavior: Behavior normal.      ED Treatments / Results  Labs (all labs ordered are listed, but only abnormal results are displayed) Labs Reviewed  BASIC METABOLIC PANEL - Abnormal; Notable for the following components:      Result Value   Sodium 131 (*)    Chloride 94 (*)    Glucose, Bld 332 (*)    BUN 30 (*)    Creatinine, Ser 1.50 (*)    Calcium 8.4 (*)    GFR calc non Af Amer 37 (*)    GFR calc Af Amer 43 (*)    All other components within normal limits  HEPATIC FUNCTION PANEL - Abnormal; Notable for the following components:   ALT 52 (*)    All other components within normal limits  OCCULT BLOOD, POC DEVICE - Abnormal; Notable for the following components:   Fecal Occult Bld POSITIVE (*)    All other components within normal limits  CBC WITH DIFFERENTIAL/PLATELET  LIPASE, BLOOD  TROPONIN I  OCCULT BLOOD X 1 CARD TO LAB, STOOL    EKG EKG Interpretation  Date/Time:  Monday May 24 2018 14:23:19 EST Ventricular Rate:  97 PR Interval:    QRS Duration: 133 QT Interval:  374 QTC Calculation: 476 R Axis:   -54 Text Interpretation:  Sinus rhythm RBBB and LAFB Left ventricular hypertrophy Confirmed  by Loren Racer ([id number]) on 05/24/2018 5:08:05 PM   Radiology Dg Chest 2 View  Result Date: 05/24/2018 CLINICAL DATA:  Cough. EXAM: CHEST - 2 VIEW COMPARISON:  Chest x-ray dated May 05, 2016. FINDINGS: The heart size and mediastinal contours are within normal limits. Normal pulmonary vascularity. No focal consolidation, pleural effusion, or pneumothorax. No acute osseous abnormality. IMPRESSION: No active cardiopulmonary  disease. Electronically Signed   By: Obie Dredge M.D.   On: 05/24/2018 13:19    Procedures Procedures (including critical care time)  Medications Ordered in ED Medications  pantoprazole (PROTONIX) 80 mg in sodium chloride 0.9 % 100 mL IVPB (80 mg Intravenous New Bag/Given 05/24/18 1648)  pantoprazole (PROTONIX) injection 40 mg (has no administration in time range)  levalbuterol (XOPENEX) nebulizer solution 0.63 mg (0.63 mg Nebulization Given 05/24/18 1433)  sodium chloride 0.9 % bolus 500 mL (0 mLs Intravenous Stopped 05/24/18 1603)  sodium chloride 0.9 % bolus 500 mL (0 mLs Intravenous Stopped 05/24/18 1706)     Initial Impression / Assessment and Plan / ED Course  I have reviewed the triage vital signs and the nursing notes.  Pertinent labs & imaging results that were available during my care of the patient were reviewed by me and considered in my medical decision making (see chart for details).       Fecal occult positive.  Hemoglobin is stable.  Suspect patient likely has upper GI bleed due to recent increased NSAID use. Patient's wheezing has mildly improved after breathing treatment.  X-ray without acute findings.  Likely bronchitis with bronchospasm. Started on PPI.  Discussed with hospitalist who will see patient in the emergency department and admit.   Final Clinical Impressions(s) / ED Diagnoses   Final diagnoses:  Bronchitis with bronchospasm  Upper GI bleed    ED Discharge Orders    None       Loren Racer, MD 05/24/18 1708

## 2018-05-24 NOTE — ED Notes (Signed)
Hospitalist stated to keep protonix order running.  Disregard D/c order

## 2018-05-24 NOTE — ED Triage Notes (Signed)
Patient complaining of cough and diarrhea x 1 week. Sent from Dr Joyce Copa office for complaint of "low oxygen." States patient's oxygen was 88% after breathing treatment there. Patient's O2 saturation 93% on room air in triage.

## 2018-05-25 ENCOUNTER — Encounter (HOSPITAL_COMMUNITY): Payer: Self-pay | Admitting: Gastroenterology

## 2018-05-25 DIAGNOSIS — R195 Other fecal abnormalities: Secondary | ICD-10-CM | POA: Diagnosis not present

## 2018-05-25 DIAGNOSIS — I10 Essential (primary) hypertension: Secondary | ICD-10-CM | POA: Diagnosis not present

## 2018-05-25 DIAGNOSIS — Z720 Tobacco use: Secondary | ICD-10-CM

## 2018-05-25 DIAGNOSIS — K219 Gastro-esophageal reflux disease without esophagitis: Secondary | ICD-10-CM | POA: Diagnosis not present

## 2018-05-25 DIAGNOSIS — N182 Chronic kidney disease, stage 2 (mild): Secondary | ICD-10-CM

## 2018-05-25 DIAGNOSIS — J209 Acute bronchitis, unspecified: Secondary | ICD-10-CM

## 2018-05-25 DIAGNOSIS — Z794 Long term (current) use of insulin: Secondary | ICD-10-CM

## 2018-05-25 DIAGNOSIS — E1122 Type 2 diabetes mellitus with diabetic chronic kidney disease: Secondary | ICD-10-CM

## 2018-05-25 LAB — CBC
HEMATOCRIT: 33.7 % — AB (ref 36.0–46.0)
Hemoglobin: 10.8 g/dL — ABNORMAL LOW (ref 12.0–15.0)
MCH: 28.6 pg (ref 26.0–34.0)
MCHC: 32 g/dL (ref 30.0–36.0)
MCV: 89.2 fL (ref 80.0–100.0)
Platelets: 217 10*3/uL (ref 150–400)
RBC: 3.78 MIL/uL — ABNORMAL LOW (ref 3.87–5.11)
RDW: 14 % (ref 11.5–15.5)
WBC: 4 10*3/uL (ref 4.0–10.5)
nRBC: 0 % (ref 0.0–0.2)

## 2018-05-25 LAB — GASTROINTESTINAL PANEL BY PCR, STOOL (REPLACES STOOL CULTURE)

## 2018-05-25 LAB — GLUCOSE, CAPILLARY
GLUCOSE-CAPILLARY: 268 mg/dL — AB (ref 70–99)
Glucose-Capillary: 184 mg/dL — ABNORMAL HIGH (ref 70–99)
Glucose-Capillary: 258 mg/dL — ABNORMAL HIGH (ref 70–99)
Glucose-Capillary: 396 mg/dL — ABNORMAL HIGH (ref 70–99)

## 2018-05-25 LAB — BASIC METABOLIC PANEL
Anion gap: 10 (ref 5–15)
BUN: 33 mg/dL — ABNORMAL HIGH (ref 8–23)
CHLORIDE: 103 mmol/L (ref 98–111)
CO2: 19 mmol/L — ABNORMAL LOW (ref 22–32)
Calcium: 7.3 mg/dL — ABNORMAL LOW (ref 8.9–10.3)
Creatinine, Ser: 1.28 mg/dL — ABNORMAL HIGH (ref 0.44–1.00)
GFR calc Af Amer: 52 mL/min — ABNORMAL LOW (ref >60)
GFR, EST NON AFRICAN AMERICAN: 44 mL/min — AB (ref >60)
Glucose, Bld: 269 mg/dL — ABNORMAL HIGH (ref 70–99)
Potassium: 4.3 mmol/L (ref 3.5–5.1)
Sodium: 132 mmol/L — ABNORMAL LOW (ref 135–145)

## 2018-05-25 LAB — MAGNESIUM: Magnesium: 2 mg/dL (ref 1.7–2.4)

## 2018-05-25 MED ORDER — PANTOPRAZOLE SODIUM 40 MG IV SOLR
INTRAVENOUS | Status: AC
  Filled 2018-05-25: qty 80

## 2018-05-25 MED ORDER — ESOMEPRAZOLE MAGNESIUM 40 MG PO CPDR: 40.0000 mg | DELAYED_RELEASE_CAPSULE | Freq: Two times a day (BID) | ORAL | 1 refills | Status: AC

## 2018-05-25 MED ORDER — ACETAMINOPHEN 325 MG PO TABS: 650.0000 mg | ORAL_TABLET | Freq: Four times a day (QID) | ORAL | 0 refills | Status: DC | PRN

## 2018-05-25 MED ORDER — DEXTROMETHORPHAN HBR 15 MG/5ML PO SYRP: 10.0000 mL | ORAL_SOLUTION | Freq: Four times a day (QID) | ORAL | 0 refills | Status: DC | PRN

## 2018-05-25 MED ORDER — OSELTAMIVIR PHOSPHATE 30 MG PO CAPS: 30.0000 mg | ORAL_CAPSULE | Freq: Two times a day (BID) | ORAL | 0 refills | Status: AC

## 2018-05-25 MED ORDER — PANTOPRAZOLE SODIUM 40 MG IV SOLR
40.0000 mg | Freq: Two times a day (BID) | INTRAVENOUS | Status: DC
  Administered 2018-05-25: 40 mg via INTRAVENOUS
  Filled 2018-05-25: qty 40

## 2018-05-25 NOTE — Care Management Obs Status (Signed)
MEDICARE OBSERVATION STATUS NOTIFICATION   Patient Details  Name: Linda Riggs MRN: [id number] Date of Birth: 15-Jan-1956   Medicare Observation Status Notification Given:  Yes    Malcolm Metro, RN 05/25/2018, 11:13 AM

## 2018-05-25 NOTE — Consult Note (Signed)
Referring Provider: Dr. Vassie Loll  Primary Care Physician:  Joette Catching, MD Primary Gastroenterologist:  [facility] most recently (Dr. Yevonne Pax), prior to this Dr. Darrick Penna with [facility]. [facility] remote past.  Date of Admission: 05/24/18 Date of Consultation: 05/25/18  Reason for Consultation: Heme positive, history of NSAID use, black stool  HPI:  Linda Riggs is a 63 y.o. year old female presenting to ED with cough for one week, shortness of breath, body aches. Found to have acute bronchitis and tested positive for Influenza A.. Due to reports of black stool, heme positive, history of NSAIDs, GI consult requested.   She has a history of IDA, last seen by [facility] in Aug 2018. Prior evaluation with colonoscopy and EGD in 2016 by Dr. Arlyce Dice, with mild diverticulosis on colonoscopy and early GE junction stricture s/p dilation. She was seen in Dec 2018 at [facility] with recommendations for updated colonoscopy, EGD, and capsule due to IDA. She did not complete this due to insurance reasons.    She notes a week long history of weakness, coughing. Black stool for about a week. Poor appetite for a week. Tried to take a few bites in but felt too nauseated. No vomiting. No abdominal pain. Started taking pepto bismol over the weekend due to diarrhea. Diarrhea for over a week. 7-8 loose stools per day. Stool frequency is less since admission and consistency is better. Takes excedrin for migraines. Prior to acute illness, had a headache and was taking excedrin daily. Nexium 40 mg daily. Outside labs reviewed from Feb 17, 2018: Hgb 11.1. Dec 2018 10.7. Ferritin was elevated in the 1300s in Dec 2018, with iron 133. Iron down to 76 in Nov 2019, no ferritin on file. Long-standing history of NSAID use. GI pathogen panel pending, Cdiff has not been collected.   Admitting Hgb 13 in setting of dehydration. 10.8 this morning, which is similar to several months ago as outpatient.   Past  Medical History:  Diagnosis Date  . Arthritis   . Chronic headaches   . Depression   . Diverticulosis   . Fibromyalgia   . GERD (gastroesophageal reflux disease)   . Goiter   . Hypertension   . Hypothyroidism   . Internal hemorrhoids   . Iron deficiency anemia    Dr. Arlyce Dice 11/10  . Osteoporosis   . Ruptured lumbar disc   . Type 2 diabetes mellitus (HCC)     Past Surgical History:  Procedure Laterality Date  . Arthroscopic left knee surgery Bilateral   . BACK SURGERY    . CHOLECYSTECTOMY N/A 11/21/2016   Procedure: LAPAROSCOPIC CHOLECYSTECTOMY;  Surgeon: Franky Macho, MD;  Location: [facility];  Service: General;  Laterality: N/A;  . COLONOSCOPY  2016   Dr. Arlyce Dice: diverticulosis  . DILATION AND CURETTAGE OF UTERUS    . ESOPHAGOGASTRODUODENOSCOPY  2016   Dr. Arlyce Dice: early GE junction stricture s/p dilation   . THYROIDECTOMY N/A 06/06/2013   Procedure: TOTAL THYROIDECTOMY;  Surgeon: Dalia Heading, MD;  Location: [facility];  Service: General;  Laterality: N/A;    Prior to Admission medications   Medication Sig Start Date End Date Taking? Authorizing Provider  amLODipine (NORVASC) 5 MG tablet Take 1 tablet by mouth daily. 04/29/18  Yes [provider]  Artificial Tear Ointment (DRY EYES OP) Apply 1 drop to eye 2 (two) times daily.   Yes [provider]  esomeprazole (NEXIUM) 40 MG capsule Take 40 mg by mouth daily.  01/25/16  Yes  [provider]  fexofenadine (ALLEGRA) 180 MG tablet Take 180 mg by mouth daily.     Yes [provider]  hydrochlorothiazide (HYDRODIURIL) 25 MG tablet Take 25 mg by mouth daily.   Yes [provider]  insulin degludec (TRESIBA FLEXTOUCH) 100 UNIT/ML SOPN FlexTouch Pen Inject 0.7 mLs (70 Units total) into the skin at bedtime. 10/15/17  Yes Nida, Denman George, MD  levothyroxine (SYNTHROID, LEVOTHROID) 125 MCG tablet TAKE ONE TABLET EVERY MORNING Patient taking differently: Take 125 mcg by mouth daily before  breakfast.  04/06/18  Yes Nida, Denman George, MD  metFORMIN (GLUCOPHAGE-XR) 500 MG 24 hr tablet TAKE ONE TABLET BY MOUTH TWICE DAILY Patient taking differently: Take 500 mg by mouth 2 (two) times daily.  04/06/18  Yes Nida, Denman George, MD  ONETOUCH DELICA LANCETS 33G MISC 4 TIMES DAILY 01/23/17  Yes Roma Kayser, MD  [facility] VERIO test strip 4 TIMES DAILY 10/05/17  Yes Roma Kayser, MD  quinapril (ACCUPRIL) 10 MG tablet Take 10 mg by mouth daily.  01/25/16  Yes [provider]  rOPINIRole (REQUIP) 2 MG tablet Take 2-4 mg by mouth at bedtime.  01/06/17  Yes [provider]  traZODone (DESYREL) 100 MG tablet Take 300 mg by mouth at bedtime.   Yes [provider]  ULTICARE SHORT PEN NEEDLES 31G X 8 MM MISC 4 TIMES DAILY 04/29/17  Yes Nida, Denman George, MD    Current Facility-Administered Medications  Medication Dose Route Frequency Provider Last Rate Last Dose  . 0.9 % NaCl with KCl 20 mEq/ L  infusion   Intravenous Continuous Emokpae, Ejiroghene E, MD 75 mL/hr at 05/24/18 2236    . acetaminophen (TYLENOL) tablet 650 mg  650 mg Oral Q6H PRN Emokpae, Ejiroghene E, MD       Or  . acetaminophen (TYLENOL) suppository 650 mg  650 mg Rectal Q6H PRN Emokpae, Ejiroghene E, MD      . diphenhydrAMINE (BENADRYL) capsule 25 mg  25 mg Oral Q8H PRN Emokpae, Ejiroghene E, MD       Or  . diphenhydrAMINE (BENADRYL) injection 12.5 mg  12.5 mg Intramuscular Q8H PRN Emokpae, Ejiroghene E, MD      . insulin aspart (novoLOG) injection 0-15 Units  0-15 Units Subcutaneous Q4H Emokpae, Ejiroghene E, MD   8 Units at 05/25/18 0458  . insulin glargine (LANTUS) injection 20 Units  20 Units Subcutaneous QHS Emokpae, Ejiroghene E, MD   20 Units at 05/24/18 2240  . ipratropium-albuterol (DUONEB) 0.5-2.5 (3) MG/3ML nebulizer solution 3 mL  3 mL Nebulization Q4H PRN Emokpae, Ejiroghene E, MD      . ipratropium-albuterol (DUONEB) 0.5-2.5 (3) MG/3ML nebulizer solution 3 mL  3 mL  Nebulization TID Emokpae, Ejiroghene E, MD      . levothyroxine (SYNTHROID, LEVOTHROID) tablet 125 mcg  125 mcg Oral Q0600 Emokpae, Ejiroghene E, MD   125 mcg at 05/25/18 0457  . lisinopril (PRINIVIL,ZESTRIL) tablet 10 mg  10 mg Oral Daily Emokpae, Ejiroghene E, MD      . ondansetron (ZOFRAN) tablet 4 mg  4 mg Oral Q6H PRN Emokpae, Ejiroghene E, MD       Or  . ondansetron (ZOFRAN) injection 4 mg  4 mg Intravenous Q6H PRN Emokpae, Ejiroghene E, MD      . oseltamivir (TAMIFLU) capsule 30 mg  30 mg Oral BID Emokpae, Ejiroghene E, MD   30 mg at 05/24/18 2237  . pantoprazole (PROTONIX) injection 40 mg  40 mg Intravenous Q12H  Vassie Loll, MD      . rOPINIRole (REQUIP) tablet 2-4 mg  2-4 mg Oral QHS Emokpae, Ejiroghene E, MD   4 mg at 05/24/18 2237  . traZODone (DESYREL) tablet 300 mg  300 mg Oral QHS Emokpae, Ejiroghene E, MD   300 mg at 05/24/18 2238    Allergies as of 05/24/2018 - Review Complete 05/24/2018  Allergen Reaction Noted  . Aspirin Itching 09/20/2014  . Flonase [fluticasone propionate] Itching 09/06/2010  . Prednisone Itching 09/06/2010  . Tramadol Itching 09/06/2010    Family History  Problem Relation Age of Onset  . Diabetes type II Mother   . Alzheimer's disease Mother   . Diabetes type II Sister   . Diabetes Sister   . Diabetes type II Father   . Cancer Father        Lung  . Diabetes Brother   . Diabetes Sister   . Colon cancer Neg Hx     Social History   Socioeconomic History  . Marital status: Single    Spouse name: Not on file  . Number of children: Not on file  . Years of education: Not on file  . Highest education level: Not on file  Occupational History  . Not on file  Social Needs  . Financial resource strain: Not on file  . Food insecurity:    Worry: Not on file    Inability: Not on file  . Transportation needs:    Medical: Not on file    Non-medical: Not on file  Tobacco Use  . Smoking status: Current Every Day Smoker    Packs/day: 0.50     Years: 20.00    Pack years: 10.00    Types: Cigarettes    Last attempt to quit: 04/27/2015    Years since quitting: 3.0  . Smokeless tobacco: Never Used  Substance and Sexual Activity  . Alcohol use: No    Alcohol/week: 0.0 standard drinks  . Drug use: No  . Sexual activity: Yes    Birth control/protection: Post-menopausal  Lifestyle  . Physical activity:    Days per week: Not on file    Minutes per session: Not on file  . Stress: Not on file  Relationships  . Social connections:    Talks on phone: Not on file    Gets together: Not on file    Attends religious service: Not on file    Active member of club or organization: Not on file    Attends meetings of clubs or organizations: Not on file    Relationship status: Not on file  . Intimate partner violence:    Fear of current or ex partner: Not on file    Emotionally abused: Not on file    Physically abused: Not on file    Forced sexual activity: Not on file  Other Topics Concern  . Not on file  Social History Narrative  . Not on file    Review of Systems: As mentioned in HPI  Physical Exam: Vital signs in last 24 hours: Temp:  [98.1 F (36.7 C)-99.5 F (37.5 C)] 98.3 F (36.8 C) (02/25 0547) Pulse Rate:  [81-107] 81 (02/25 0547) Resp:  [15-21] 18 (02/25 0547) BP: (95-166)/(53-83) 133/53 (02/25 0547) SpO2:  [90 %-96 %] 94 % (02/25 0547) Weight:  [82.6 kg] 82.6 kg (02/24 1254) Last BM Date: 05/24/18 General:   Alert,  Well-developed, well-nourished, pleasant and cooperative in NAD Head:  Normocephalic and atraumatic. Eyes:  Sclera clear, no icterus.  Conjunctiva pink. Ears:  Normal auditory acuity. Nose:  No deformity, discharge,  or lesions. Mouth:  No deformity or lesions, dentition normal. Lungs:  Scattered rhonchi Heart:  S1 S2 present without obvious murmurs  Abdomen:  Soft, nontender and nondistended. No masses, hepatosplenomegaly or hernias noted. Normal bowel sounds, without guarding, and without  rebound.   Rectal:  Deferred until time of colonoscopy.   Msk:  Symmetrical without gross deformities. Normal posture. Extremities:  Without  edema. Neurologic:  Alert and  oriented x4 Psych:  Alert and cooperative. Normal mood and affect.  Intake/Output from previous day: 02/24 0701 - 02/25 0700 In: 1100 [IV Piggyback:1100] Out: -  Intake/Output this shift: No intake/output data recorded.  Lab Results: Recent Labs    05/24/18 1353 05/24/18 2223 05/25/18 0533  WBC 7.3  --  4.0  HGB 13.1 11.4* 10.8*  HCT 41.8 35.1* 33.7*  PLT 301  --  217   BMET Recent Labs    05/24/18 1353 05/25/18 0533  NA 131* 132*  K 3.9 4.3  CL 94* 103  CO2 22 19*  GLUCOSE 332* 269*  BUN 30* 33*  CREATININE 1.50* 1.28*  CALCIUM 8.4* 7.3*   LFT Recent Labs    05/24/18 1353  PROT 7.2  ALBUMIN 3.6  AST 36  ALT 52*  ALKPHOS 56  BILITOT 0.5  BILIDIR 0.1  IBILI 0.4   No results found for: IRON, TIBC, FERRITIN  Studies/Results: Dg Chest 2 View  Result Date: 05/24/2018 CLINICAL DATA:  Cough. EXAM: CHEST - 2 VIEW COMPARISON:  Chest x-ray dated May 05, 2016. FINDINGS: The heart size and mediastinal contours are within normal limits. Normal pulmonary vascularity. No focal consolidation, pleural effusion, or pneumothorax. No acute osseous abnormality. IMPRESSION: No active cardiopulmonary disease. Electronically Signed   By: Obie Dredge M.D.   On: 05/24/2018 13:19    Impression: 63 year old female admitted with acute bronchitis and flu, also noting week-long history of black stool. Heme positive on admission. Long-standing history of IDA, with last colonoscopy/EGD in 2016. She was lost to follow-up with [facility] and actually went to another GI practice at [facility] in Dec 2018, who also recommended further evaluation with colonoscopy/EGD/capsule but was not completed due to insurance.   Diarrhea is improving; it is notable she took pepto bismol over the weekend. Unclear if true melena. She does  endorse excedrin use and has long-standing history of NSAID use. Recommend PPI BID, follow H/H, and pursue elective endoscopic procedures as outpatient once she recovers from acute illness. Doubt diarrhea is infectious process. GI pathogen panel in process, and Cdiff has not been collected. Diarrhea improving.   Will start soft diet today. She is unsure if she wants to follow with [facility] or [facility]. Regardless, will need follow-up with GI as outpatient for continued care due to known history of IDA.   Plan: Soft diet PPI BID Avoid NSAIDs Follow-up on pending GI pathogen panel Not a candidate for endoscopic procedures in setting of acute illness   Gelene Mink, PhD, ANP-BC [facility]     LOS: 0 days    05/25/2018, 8:09 AM

## 2018-05-25 NOTE — Discharge Summary (Signed)
Physician Discharge Summary  Linda Riggs VHQ:[id number] DOB: 02-07-1956 DOA: 05/24/2018  PCP: Joette Catching, MD  Admit date: 05/24/2018 Discharge date: 05/25/2018  Time spent: 35 minutes  Recommendations for Outpatient Follow-up:  1. Repeat basic metabolic panel to follow electrolytes and renal function 2. Continue assisting patient with tobacco cessation process 3. Reassess complete resolution of her symptoms from the flu. 4. Make sure patient has follow-up with gastroenterology service for further evaluation of melanotic stools (including need for EGD and/or capsule endoscopy).   Discharge Diagnoses:  Principal Problem:   Bronchitis with bronchospasm Active Problems:   Type 2 diabetes mellitus with stage 2 chronic kidney disease, with long-term current use of insulin (HCC)   Esophageal reflux   Tobacco abuse   Essential hypertension, benign   Heme positive stool acute on chronic renal failure: stage 2 at baseline  Discharge Condition: Stable and improved.  Patient discharged home with instructions to follow-up with PCP in 10 days.  Diet recommendation: Heart healthy diet and modified carbohydrate.  Filed Weights   05/24/18 1254  Weight: 82.6 kg    History of present illness:  As per H&P written by Dr. Mariea Clonts on 05/24/2018  63 y.o. female with medical history significant for DM2, HTN, Hypothyroidism, Depression, diverticulosis, who presented to the Ed with c/o of cough of about 1 week duration, she also reports some heavy breathing over the past 2 days, and generalized body aches. She denies fevers.  No Chest pain.   She also reports multiple episodes of black loose stools over the past 2-3 days, up to 10 times a day, so far 5 today.  No abdominal pain.  No vomiting. Patient endorses NSAID use, ~10 dose of Excedrin Migraine's over the past week.  No prior episodes of GI bleed.  She has had both colonoscopy and EGD 2016.  ED Course: Stable vitals blood pressure in the  140s.  O2 sats greater than 91% on room air.  Hgb at baseline 13.  Positive FOBT.  Creatinine stable at 1.5.  Two-view chest x-ray negative for acute abnormality.  1L bolus given. Patient was started on Protonix drip with 80 mg bolus.  Hospitalist to admit for possible GI bleed.  Hospital Course:  1-influenza A -Patient presented with flulike symptoms, cough, shortness of breath, wheezing, nausea, vomiting -Chest x-ray 2 views negative for acute abnormalities -WBCs 7.3 -Patient received supportive care, fluid resuscitation and started on Tamiflu -On the day of discharge even if still feeling suboptimal, patient feels that she can manage her symptoms at home. -Discharge with instruction to continue the use of Allegra, as needed Robitussin, Tamiflu for 4 more days, maintaining good hydration and to resume the use of home inhaler regimen.  2-diarrhea: No abdominal pain, currently no having any nausea or vomiting. 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  4-acute kidney injury: Which is acute on chronic renal failure; patient with a stage II at baseline based on previous GFR -In the setting of dehydration and prerenal azotemia -Creatinine essentially back to baseline after fluid resuscitation -Safe to resume home medications including diuretics and lisinopril -Patient advised to keep himself  well-hydrated.  5-hypertension -Stable and well-controlled -Resume home antihypertensive regimen -Patient advised to follow heart healthy diet.  6-type 2  diabetes mellitus with nephropathy -Continue home hypoglycemic regimen -Most recent A1c 7.1  7-tobacco abuse -I have discussed tobacco cessation with the patient.  I have counseled the patient regarding the negative impacts of continued tobacco use including but not limited to lung cancer, COPD, and cardiovascular disease.  I have discussed alternatives to tobacco and modalities that may help facilitate tobacco cessation including but not limited to biofeedback, hypnosis, and medications.  Total time spent with tobacco counseling was 4 minutes -nicotine patch declined.   Procedures:  See below for x-ray reports.  Consultations:  Gastroenterology service was consulted  Discharge Exam: Vitals:   05/25/18 1439 05/25/18 1441  BP: (!) 152/55   Pulse: 81   Resp: 18   Temp: 97.7 F (36.5 C)   SpO2: 99% 99%    General: Afebrile, no further episode of nausea vomiting.  Patient denies chest pain and denies diarrhea.  She had 2 bowel movements prior to discharge and both of them were solid and good amount. Cardiovascular: S1 and S2, no rubs, no gallops, no murmurs. Respiratory: Positive scattered rhonchi, no wheezing, no crackles, normal respiratory effort. Abdomen: Soft, nontender, nondistended, positive bowel sounds Extremities: No edema, no cyanosis or clubbing.  Discharge Instructions   Discharge Instructions    Diet - low sodium heart healthy   Complete by:  As directed    Discharge instructions   Complete by:  As directed    Keep yourself well-hydrated Take medications as prescribed Arrange follow-up with PCP in 10 days     Allergies as of 05/25/2018      Reactions   Aspirin Itching   Flonase [fluticasone Propionate] Itching   Prednisone Itching   Tramadol Itching      Medication List    TAKE these medications   acetaminophen 325 MG tablet Commonly known as:  TYLENOL Take 2 tablets (650 mg total) by mouth every 6 (six) hours as needed for mild pain or headache  (or Fever >/= 101).   amLODipine 5 MG tablet Commonly known as:  NORVASC Take 1 tablet by mouth daily.   dextromethorphan 15 MG/5ML syrup Take 10 mLs (30 mg total) by mouth 4 (four) times daily as needed for cough.   DRY EYES OP Apply 1 drop to eye 2 (two) times daily.   esomeprazole 40 MG capsule Commonly known as:  NEXIUM Take 1 capsule (40 mg total) by mouth 2 (two) times daily before a meal. What changed:  when to take this   fexofenadine 180 MG tablet Commonly known as:  ALLEGRA Take 180 mg by mouth daily.   hydrochlorothiazide 25 MG tablet Commonly known as:  HYDRODIURIL Take 25 mg by mouth daily.   insulin degludec 100 UNIT/ML Sopn FlexTouch Pen Commonly known as:  TRESIBA FLEXTOUCH Inject 0.7 mLs (70 Units total) into the skin at bedtime.   levothyroxine 125 MCG tablet Commonly known as:  SYNTHROID, LEVOTHROID TAKE ONE TABLET EVERY MORNING What changed:  when to take this   metFORMIN 500 MG 24 hr tablet Commonly known as:  GLUCOPHAGE-XR TAKE ONE TABLET BY MOUTH TWICE DAILY   ONETOUCH DELICA LANCETS 33G Misc 4 TIMES DAILY   ONETOUCH VERIO test strip Generic drug:  glucose blood 4 TIMES DAILY   oseltamivir 30 MG capsule Commonly known as:  TAMIFLU Take 1 capsule (30 mg total) by mouth 2 (two) times daily for 4 days.  quinapril 10 MG tablet Commonly known as:  ACCUPRIL Take 10 mg by mouth daily.   rOPINIRole 2 MG tablet Commonly known as:  REQUIP Take 2-4 mg by mouth at bedtime.   traZODone 100 MG tablet Commonly known as:  DESYREL Take 300 mg by mouth at bedtime.   ULTICARE SHORT PEN NEEDLES 31G X 8 MM Misc Generic drug:  Insulin Pen Needle 4 TIMES DAILY      Allergies  Allergen Reactions  . Aspirin Itching  . Flonase [Fluticasone Propionate] Itching  . Prednisone Itching  . Tramadol Itching    The results of significant diagnostics from this hospitalization (including imaging, microbiology, ancillary and laboratory) are listed below  for reference.    Significant Diagnostic Studies: Dg Chest 2 View  Result Date: 05/24/2018 CLINICAL DATA:  Cough. EXAM: CHEST - 2 VIEW COMPARISON:  Chest x-ray dated May 05, 2016. FINDINGS: The heart size and mediastinal contours are within normal limits. Normal pulmonary vascularity. No focal consolidation, pleural effusion, or pneumothorax. No acute osseous abnormality. IMPRESSION: No active cardiopulmonary disease. Electronically Signed   By: Obie Dredge M.D.   On: 05/24/2018 13:19    Microbiology: No results found for this or any previous visit (from the past 240 hour(s)).   Labs: Basic Metabolic Panel: Recent Labs  Lab 05/24/18 1353 05/25/18 0533  NA 131* 132*  K 3.9 4.3  CL 94* 103  CO2 22 19*  GLUCOSE 332* 269*  BUN 30* 33*  CREATININE 1.50* 1.28*  CALCIUM 8.4* 7.3*  MG 1.5* 2.0   Liver Function Tests: Recent Labs  Lab 05/24/18 1353  AST 36  ALT 52*  ALKPHOS 56  BILITOT 0.5  PROT 7.2  ALBUMIN 3.6   Recent Labs  Lab 05/24/18 1353  LIPASE 43   CBC: Recent Labs  Lab 05/24/18 1353 05/24/18 2223 05/25/18 0533  WBC 7.3  --  4.0  NEUTROABS 5.6  --   --   HGB 13.1 11.4* 10.8*  HCT 41.8 35.1* 33.7*  MCV 88.6  --  89.2  PLT 301  --  217   Cardiac Enzymes: Recent Labs  Lab 05/24/18 1353  TROPONINI <0.03    CBG: Recent Labs  Lab 05/24/18 2140 05/25/18 0008 05/25/18 0350 05/25/18 0730 05/25/18 1105  GLUCAP 343* 396* 258* 184* 268*    Signed:  Vassie Loll MD.  [facility] 05/25/2018, 4:17 PM

## 2018-05-25 NOTE — Progress Notes (Signed)
Inpatient Diabetes Program Recommendations  AACE/ADA: New Consensus Statement on Inpatient Glycemic Control  Target Ranges:  Prepandial:   less than 140 mg/dL      Peak postprandial:   less than 180 mg/dL (1-2 hours)      Critically ill patients:  140 - 180 mg/dL   Results for BRIONCA, SHERPA (MRN [id number]) as of 05/25/2018 07:23  Ref. Range 05/25/2018 05:33  Glucose Latest Ref Range: 70 - 99 mg/dL 185 (H)   Results for SHEVONDA, RIDEAU (MRN [id number]) as of 05/25/2018 07:23  Ref. Range 05/24/2018 21:40 05/25/2018 00:08 05/25/2018 03:50  Glucose-Capillary Latest Ref Range: 70 - 99 mg/dL 244 (H) 695 (H) 072 (H)   Results for TMYA, PIZZOFERRATO (MRN [id number]) as of 05/25/2018 07:23  Ref. Range 01/26/2018 10:26  Hemoglobin A1C Latest Ref Range: <5.7 % of total Hgb 7.1 (H)   Review of Glycemic Control  Diabetes history: DM2 Outpatient Diabetes medications: Tresiba 70 units QHS, Metformin XR 500 mg BID Current orders for Inpatient glycemic control: Lantus 20 units QHS, Novolog 0-15 units Q4H; Solumedrol 60 mg Q12H  Inpatient Diabetes Program Recommendations:  Insulin - Basal: Please consider increasing Lantus to 40 units QHS.  Thanks, Orlando Penner, RN, MSN, CDE Diabetes Coordinator Inpatient Diabetes Program [phone] (Team Pager from 8am to 5pm)

## 2018-05-26 ENCOUNTER — Telehealth: Payer: Self-pay | Admitting: Gastroenterology

## 2018-05-26 LAB — HIV ANTIBODY (ROUTINE TESTING W REFLEX): HIV Screen 4th Generation wRfx: NONREACTIVE

## 2018-05-26 NOTE — Telephone Encounter (Signed)
Please arrange outpatient hospital follow-up in 4-6 weeks. Will need to arrange EGD with capsule placement with Dr. Darrick Penna.

## 2018-05-28 ENCOUNTER — Encounter: Payer: Self-pay | Admitting: Gastroenterology

## 2018-05-28 NOTE — Telephone Encounter (Signed)
SCHEDULED

## 2018-06-01 ENCOUNTER — Other Ambulatory Visit: Payer: Self-pay | Admitting: Adult Health Nurse Practitioner

## 2018-06-01 DIAGNOSIS — G44021 Chronic cluster headache, intractable: Secondary | ICD-10-CM | POA: Insufficient documentation

## 2018-06-01 DIAGNOSIS — Z8719 Personal history of other diseases of the digestive system: Secondary | ICD-10-CM | POA: Insufficient documentation

## 2018-06-02 ENCOUNTER — Encounter: Payer: Self-pay | Admitting: Gastroenterology

## 2018-06-02 LAB — COMPLETE METABOLIC PANEL WITH GFR
AG RATIO: 1.2 (calc) (ref 1.0–2.5)
ALKALINE PHOSPHATASE (APISO): 57 U/L (ref 37–153)
ALT: 25 U/L (ref 6–29)
AST: 21 U/L (ref 10–35)
Albumin: 3.3 g/dL — ABNORMAL LOW (ref 3.6–5.1)
BILIRUBIN TOTAL: 0.4 mg/dL (ref 0.2–1.2)
BUN: 17 mg/dL (ref 7–25)
CHLORIDE: 100 mmol/L (ref 98–110)
CO2: 22 mmol/L (ref 20–32)
Calcium: 9.2 mg/dL (ref 8.6–10.4)
Creat: 0.9 mg/dL (ref 0.50–0.99)
GFR, Est African American: 79 mL/min/{1.73_m2} (ref >=60)
GFR, Est Non African American: 68 mL/min/{1.73_m2} (ref >=60)
Globulin: 2.8 g/dL (calc) (ref 1.9–3.7)
Glucose, Bld: 157 mg/dL — ABNORMAL HIGH (ref 65–99)
POTASSIUM: 3.9 mmol/L (ref 3.5–5.3)
SODIUM: 136 mmol/L (ref 135–146)
Total Protein: 6.1 g/dL (ref 6.1–8.1)

## 2018-06-02 LAB — HEMOGLOBIN A1C
Hgb A1c MFr Bld: 8.8 % of total Hgb — ABNORMAL HIGH (ref <5.7)
MEAN PLASMA GLUCOSE: 206 (calc)
eAG (mmol/L): 11.4 (calc)

## 2018-06-08 ENCOUNTER — Encounter: Payer: Self-pay | Admitting: "Endocrinology

## 2018-06-08 ENCOUNTER — Ambulatory Visit: Payer: Medicare Other | Admitting: "Endocrinology

## 2018-06-08 VITALS — BP 143/81 | HR 97 | Ht 63.0 in | Wt 187.0 lb

## 2018-06-08 DIAGNOSIS — E1122 Type 2 diabetes mellitus with diabetic chronic kidney disease: Secondary | ICD-10-CM | POA: Diagnosis not present

## 2018-06-08 DIAGNOSIS — N3281 Overactive bladder: Secondary | ICD-10-CM | POA: Insufficient documentation

## 2018-06-08 DIAGNOSIS — E782 Mixed hyperlipidemia: Secondary | ICD-10-CM

## 2018-06-08 DIAGNOSIS — E890 Postprocedural hypothyroidism: Secondary | ICD-10-CM

## 2018-06-08 DIAGNOSIS — Z794 Long term (current) use of insulin: Secondary | ICD-10-CM

## 2018-06-08 DIAGNOSIS — I10 Essential (primary) hypertension: Secondary | ICD-10-CM

## 2018-06-08 DIAGNOSIS — N183 Chronic kidney disease, stage 3 (moderate): Secondary | ICD-10-CM

## 2018-06-08 NOTE — Progress Notes (Signed)
Endocrinology follow-up note  Subjective:    Patient ID: Linda Riggs, female    DOB: 1955-08-13, PCP Joette Catching, MD   Past Medical History:  Diagnosis Date  . Arthritis   . Chronic headaches   . Depression   . Diverticulosis   . Fibromyalgia   . GERD (gastroesophageal reflux disease)   . Goiter   . Hypertension   . Hypothyroidism   . Internal hemorrhoids   . Iron deficiency anemia    Dr. Arlyce Dice 11/10  . Osteoporosis   . Ruptured lumbar disc   . Type 2 diabetes mellitus (HCC)    Past Surgical History:  Procedure Laterality Date  . Arthroscopic left knee surgery Bilateral   . BACK SURGERY    . CHOLECYSTECTOMY N/A 11/21/2016   Procedure: LAPAROSCOPIC CHOLECYSTECTOMY;  Surgeon: Franky Macho, MD;  Location: [facility];  Service: General;  Laterality: N/A;  . COLONOSCOPY  2016   Dr. Arlyce Dice: diverticulosis  . DILATION AND CURETTAGE OF UTERUS    . ESOPHAGOGASTRODUODENOSCOPY  2016   Dr. Arlyce Dice: early GE junction stricture s/p dilation   . THYROIDECTOMY N/A 06/06/2013   Procedure: TOTAL THYROIDECTOMY;  Surgeon: Dalia Heading, MD;  Location: [facility];  Service: General;  Laterality: N/A;   Social History   Socioeconomic History  . Marital status: Single    Spouse name: Not on file  . Number of children: Not on file  . Years of education: Not on file  . Highest education level: Not on file  Occupational History  . Not on file  Social Needs  . Financial resource strain: Not on file  . Food insecurity:    Worry: Not on file    Inability: Not on file  . Transportation needs:    Medical: Not on file    Non-medical: Not on file  Tobacco Use  . Smoking status: Former Smoker    Packs/day: 0.50    Years: 20.00    Pack years: 10.00    Types: Cigarettes    Last attempt to quit: 04/01/2018    Years since quitting: 0.1  . Smokeless tobacco: Never Used  Substance and Sexual Activity  . Alcohol use: No    Alcohol/week: 0.0 standard drinks  . Drug use: No  . Sexual  activity: Yes    Birth control/protection: Post-menopausal  Lifestyle  . Physical activity:    Days per week: Not on file    Minutes per session: Not on file  . Stress: Not on file  Relationships  . Social connections:    Talks on phone: Not on file    Gets together: Not on file    Attends religious service: Not on file    Active member of club or organization: Not on file    Attends meetings of clubs or organizations: Not on file    Relationship status: Not on file  Other Topics Concern  . Not on file  Social History Narrative  . Not on file   Outpatient Encounter Medications as of 06/08/2018  Medication Sig  . acetaminophen (TYLENOL) 325 MG tablet Take 2 tablets (650 mg total) by mouth every 6 (six) hours as needed for mild pain or headache (or Fever >/= 101).  Marland Kitchen amLODipine (NORVASC) 5 MG tablet Take 1 tablet by mouth daily.  . Artificial Tear Ointment (DRY EYES OP) Apply 1 drop to eye 2 (two) times daily.  Marland Kitchen dextromethorphan 15 MG/5ML syrup Take 10 mLs (30 mg total) by mouth 4 (four) times daily  as needed for cough.  . esomeprazole (NEXIUM) 40 MG capsule Take 1 capsule (40 mg total) by mouth 2 (two) times daily before a meal.  . fexofenadine (ALLEGRA) 180 MG tablet Take 180 mg by mouth daily.    . hydrochlorothiazide (HYDRODIURIL) 25 MG tablet Take 25 mg by mouth daily.  . insulin degludec (TRESIBA FLEXTOUCH) 100 UNIT/ML SOPN FlexTouch Pen Inject 0.7 mLs (70 Units total) into the skin at bedtime.  Marland Kitchen levothyroxine (SYNTHROID, LEVOTHROID) 125 MCG tablet TAKE ONE TABLET EVERY MORNING (Patient taking differently: Take 125 mcg by mouth daily before breakfast. )  . metFORMIN (GLUCOPHAGE-XR) 500 MG 24 hr tablet TAKE ONE TABLET BY MOUTH TWICE DAILY (Patient taking differently: Take 500 mg by mouth 2 (two) times daily. )  . ONETOUCH DELICA LANCETS 33G MISC 4 TIMES DAILY  . ONETOUCH VERIO test strip 4 TIMES DAILY  . rOPINIRole (REQUIP) 2 MG tablet Take 2-4 mg by mouth at bedtime.   .  traZODone (DESYREL) 100 MG tablet Take 300 mg by mouth at bedtime.  Marland Kitchen ULTICARE SHORT PEN NEEDLES 31G X 8 MM MISC 4 TIMES DAILY  . [DISCONTINUED] quinapril (ACCUPRIL) 10 MG tablet Take 10 mg by mouth daily.    No facility-administered encounter medications on file as of 06/08/2018.    ALLERGIES: Allergies  Allergen Reactions  . Aspirin Itching  . Flonase [Fluticasone Propionate] Itching  . Prednisone Itching  . Tramadol Itching   VACCINATION STATUS:  There is no immunization history on file for this patient.  Diabetes  She presents for her follow-up diabetic visit. She has type 2 diabetes mellitus. Her disease course has been worsening. Pertinent negatives for hypoglycemia include no confusion, headaches, pallor or seizures. Pertinent negatives for diabetes include no chest pain, no fatigue, no polydipsia, no polyphagia and no polyuria. Symptoms are worsening. There are no diabetic complications. Risk factors for coronary artery disease include diabetes mellitus, dyslipidemia, tobacco exposure, sedentary lifestyle and hypertension. Current diabetic treatment includes insulin injections and oral agent (dual therapy). She is compliant with treatment most of the time. Her weight is stable. She is following a generally unhealthy diet. When asked about meal planning, she reported none. She rarely participates in exercise. Blood glucose monitoring compliance is adequate. Her breakfast blood glucose range is generally 140-180 mg/dl. Her overall blood glucose range is 140-180 mg/dl. An ACE inhibitor/angiotensin II receptor blocker is being taken.  Thyroid Problem  Presents for follow-up (She has postsurgical hypothyroidism.  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 Patient reports no cold intolerance, diarrhea, fatigue, heat intolerance or palpitations. The symptoms have been stable. Past treatments include levothyroxine. Prior procedures include  thyroidectomy. Her past medical history is significant for diabetes and hyperlipidemia.  Hypertension  This is a chronic problem. The current episode started more than 1 year ago. The problem is controlled. Pertinent negatives include no chest pain, headaches, palpitations or shortness of breath. Risk factors for coronary artery disease include dyslipidemia, diabetes mellitus, smoking/tobacco exposure, sedentary lifestyle and post-menopausal state. Past treatments include ACE inhibitors. Identifiable causes of hypertension include a thyroid problem.  Hyperlipidemia  This is a chronic problem. The current episode started more than 1 year ago. Recent lipid tests were reviewed and are high. Exacerbating diseases include diabetes. Pertinent negatives include no chest pain, myalgias or shortness of breath. Current antihyperlipidemic treatment includes statins. Risk factors for coronary artery disease include diabetes mellitus, dyslipidemia, hypertension, a sedentary lifestyle and post-menopausal.    Review of Systems  Constitutional: Negative for fatigue and unexpected weight change.  HENT: Negative for trouble swallowing and voice change.   Eyes: Negative for visual disturbance.  Respiratory: Negative for cough, shortness of breath and wheezing.   Cardiovascular: Negative for chest pain, palpitations and leg swelling.  Gastrointestinal: Negative for diarrhea, nausea and vomiting.  Endocrine: Negative for cold intolerance, heat intolerance, polydipsia, polyphagia and polyuria.  Musculoskeletal: Negative for arthralgias and myalgias.  Skin: Negative for color change, pallor, rash and wound.  Neurological: Negative for seizures and headaches.  Psychiatric/Behavioral: Negative for confusion and suicidal ideas.    Objective:    BP (!) 143/81   Pulse 97   Ht  (1.6 m)   Wt 187 lb (84.8 kg)   BMI 33.13 kg/m   Wt Readings from Last 3 Encounters:  06/08/18 187 lb (84.8 kg)  05/24/18 182 lb  (82.6 kg)  05/13/18 181 lb (82.1 kg)    Physical Exam  Constitutional: She is oriented to person, place, and time. She appears well-developed.  HENT:  Head: Normocephalic and atraumatic.  Eyes: EOM are normal.  Neck: Normal range of motion. Neck supple. No tracheal deviation present. No thyromegaly present.  Cardiovascular: Normal rate.  Pulmonary/Chest: Effort normal.  Abdominal: Bowel sounds are normal. There is no abdominal tenderness. There is no guarding.  Musculoskeletal: Normal range of motion.        General: No edema.  Neurological: She is alert and oriented to person, place, and time. No cranial nerve deficit. Coordination normal.  Skin: Skin is warm and dry. No rash noted. No erythema. No pallor.  Psychiatric: She has a normal mood and affect. Judgment normal.   CMP Latest Ref Rng & Units 06/01/2018 05/25/2018 05/24/2018  Glucose 65 - 99 mg/dL 725(D) 664(Q) 034(V)  BUN 7 - 25 mg/dL 17 42(V) 95(G)  Creatinine 0.50 - 0.99 mg/dL 3.87 5.64(P) 3.29(J)  Sodium 135 - 146 mmol/L 136 132(L) 131(L)  Potassium 3.5 - 5.3 mmol/L 3.9 4.3 3.9  Chloride 98 - 110 mmol/L 100 103 94(L)  CO2 20 - 32 mmol/L 22 19(L) 22  Calcium 8.6 - 10.4 mg/dL 9.2 7.3(L) 8.4(L)  Total Protein 6.1 - 8.1 g/dL 6.1 - 7.2  Total Bilirubin 0.2 - 1.2 mg/dL 0.4 - 0.5  Alkaline Phos 38 - 126 U/L - - 56  AST 10 - 35 U/L 21 - 36  ALT 6 - 29 U/L 25 - 52(H)    Diabetic Labs (most recent): Lab Results  Component Value Date   HGBA1C 8.8 (H) 06/01/2018   HGBA1C 7.1 (H) 01/26/2018   HGBA1C 8.0 (H) 10/06/2017   Lipid Panel     Component Value Date/Time   CHOL 184 07/07/2017 0928   TRIG 191 (H) 07/07/2017 0928   HDL 42 (L) 07/07/2017 0928   CHOLHDL 4.4 07/07/2017 0928   VLDL 33 (H) 03/13/2016 1015   LDLCALC 111 (H) 07/07/2017 0928    Results for DARSHA, ZUMSTEIN (MRN [id number]) as of 06/08/2018 09:56  Ref. Range 10/06/2017 10:05 01/26/2018 10:26  TSH Latest Ref Range: 0.40 - 4.50 mIU/L 1.02 1.51  T4,Free(Direct)  Latest Ref Range: 0.8 - 1.8 ng/dL 1.8 1.6     Assessment & Plan:   1. Uncontrolled type 2 diabetes mellitus with complication, with long-term current use of insulin (HCC)  -She returns with worsening A1c of 8.8%, increasing from 7.1%.    -Her diabetes is complicated by stage 3 renal insufficiency, chronic heavy smoking, sedentary lifestyle and patient remains at extremely high risk  for more acute and chronic complications of diabetes which include CAD, CVA, CKD, retinopathy, and neuropathy. These are all discussed in detail with the patient.  Recent labs reviewed and discussed with her.   - I have re-counseled the patient on diet management   by adopting a carbohydrate restricted / protein rich  Diet.  - Patient admits there is a room for improvement in her diet and drink choices. -  Suggestion is made for her to avoid simple carbohydrates  from her diet including Cakes, Sweet Desserts / Pastries, Ice Cream, Soda (diet and regular), Sweet Tea, Candies, Chips, Cookies, Store Bought Juices, Alcohol in Excess of  1-2 drinks a day, Artificial Sweeteners, and "Sugar-free" Products. This will help patient to have stable blood glucose profile and potentially avoid unintended weight gain.  - Patient is advised to stick to a routine mealtimes to eat 3 meals  a day and avoid unnecessary snacks ( to snack only to correct hypoglycemia).   - I have approached patient with the following individualized plan to manage diabetes and patient agrees.  -She has controlled fasting blood glucose profile, cannot tolerate any higher dose than Tresiba 70 units nightly.  She is advised to continue on same, start monitoring blood glucose 2 times a day- daily before breakfast and at bedtime.   -If her next visit A1c is greater than 9%, she will be considered for prandial insulin.    -Patient is encouraged to call clinic for blood glucose levels less than 70 or above 200 mg /dl. -She is tolerating metformin, advised  to continue metformin 500 mg p.o. twice daily after breakfast and supper.     - Patient specific target  for A1c; LDL, HDL, Triglycerides, and  Waist Circumference were discussed in detail.  2) BP/HTN: Her blood pressure is in not controlled to target.  She is advised to continue her current blood pressure medications including lisinopril 10 mg p.o. daily, hydrochlorothiazide 25 mg p.o. daily.   3) Lipids/HPL: Recent lipid panel showed uncontrolled LDL at 111.  She is advised to continue atorvastatin 80 mg p.o. nightly, and fenofibrate 160 mg p.o. Daily.  4)  Postsurgical hypothyroidism  -Her previsit labs did not include thyroid function tests.  She is advised to continue levothyroxine 125 mcg p.o. every morning.     - We discussed about the correct intake of her thyroid hormone, on empty stomach at fasting, with water, separated by at least 30 minutes from breakfast and other medications,  and separated by more than 4 hours from calcium, iron, multivitamins, acid reflux medications (PPIs). -Patient is made aware of the fact that thyroid hormone replacement is needed for life, dose to be adjusted by periodic monitoring of thyroid function tests.   5)  Weight/Diet:   declines to see the dietitian, exercise, and carbohydrates information provided.  6) Chronic Care/Health Maintenance:  -Patient is on ACEI/ARB and Statin medications and encouraged to continue to follow up with Ophthalmology, Podiatrist at least yearly or according to recommendations, and advised to quit smoking. I have recommended yearly flu vaccine and pneumonia vaccination at least every 5 years; moderate intensity exercise for up to 150 minutes weekly; and  sleep for at least 7 hours a day. - I advised patient to maintain close follow up with Joette Catching, MD for primary care needs.  - Time spent with the patient: 25 min, of which >50% was spent in reviewing her blood glucose logs , discussing her hypoglycemia and  hyperglycemia episodes, reviewing her  current and  previous labs / studies and medications  doses and developing a plan to avoid hypoglycemia and hyperglycemia. Please refer to Patient Instructions for Blood Glucose Monitoring and Insulin/Medications Dosing Guide"  in media tab for additional information. Please  also refer to " Patient Self Inventory" in the Media  tab for reviewed elements of pertinent patient history.  Nile Dear participated in the discussions, expressed understanding, and voiced agreement with the above plans.  All questions were answered to her satisfaction. she is encouraged to contact clinic should she have any questions or concerns prior to her return visit.   Follow up plan: -Return in about 3 months (around 09/08/2018) for Follow up with Pre-visit Labs, Meter, and Logs.  Marquis Lunch, MD Phone: [phone]  Fax: [phone]  -  This note was partially dictated with voice recognition software. Similar sounding words can be transcribed inadequately or may not  be corrected upon review.  06/08/2018, 10:59 AM

## 2018-06-08 NOTE — Patient Instructions (Signed)

## 2018-06-15 ENCOUNTER — Encounter (HOSPITAL_COMMUNITY): Payer: Self-pay

## 2018-06-15 ENCOUNTER — Ambulatory Visit (HOSPITAL_COMMUNITY): Payer: Medicare Other

## 2018-06-17 ENCOUNTER — Ambulatory Visit (HOSPITAL_COMMUNITY): Payer: Medicare Other

## 2018-07-05 ENCOUNTER — Other Ambulatory Visit: Payer: Self-pay | Admitting: "Endocrinology

## 2018-07-28 ENCOUNTER — Encounter: Payer: Self-pay | Admitting: Gastroenterology

## 2018-07-28 ENCOUNTER — Other Ambulatory Visit: Payer: Self-pay

## 2018-07-28 ENCOUNTER — Ambulatory Visit (INDEPENDENT_AMBULATORY_CARE_PROVIDER_SITE_OTHER): Payer: Medicare Other | Admitting: Gastroenterology

## 2018-07-28 DIAGNOSIS — R1310 Dysphagia, unspecified: Secondary | ICD-10-CM | POA: Diagnosis not present

## 2018-07-28 DIAGNOSIS — D508 Other iron deficiency anemias: Secondary | ICD-10-CM | POA: Diagnosis not present

## 2018-07-28 NOTE — Patient Instructions (Signed)
Make sure you are chewing your food well, taking small bites, eating slowly, and sitting upright while eating. If swallowing worsens, call us right away.  If you see any further black stool,call us.  We will be arranging an upper endoscopy with possible capsule study to take pictures of your small intestine in the near future. I will be discussing with Dr. Darrick Penna the best timing of this.  Return in 4-6 months!  I enjoyed talking with you again today! As you know, I value our relationship and want to provide genuine, compassionate, and quality care. I welcome your feedback. If you receive a survey regarding your visit,  I greatly appreciate you taking time to fill this out. See you next time!  Gelene Mink, PhD, ANP-BC [facility]

## 2018-07-28 NOTE — Progress Notes (Addendum)
REVIEWED-EGD/DIL FOR DYSPHAGIA 2010 AND 2016: STRICTURE NOTED BUT NO MAGES TO CONFIRM DEFINITE STRICTURE. TCS FOR FEDA: 2010/2016/ AGREE WITH EGD/DIL W/ MAC AT FIRST AVAILABLE APPT. AND CAPSULE PLACEMENT. NO INDICATION FOR URGENT ENDOSCOPY. PT SHOULD FOLLOW A SOFT MECHANICAL DIET UNTIL HER EGD/DIL.  Primary Care Physician:  Dione Housekeeper, MD  Primary GI: Dr. Oneida Alar   Virtual Visit via Telephone Note Due to COVID-19, visit is conducted virtually and was requested by patient.   I connected with Gwyneth Revels on 07/28/18 at  2:00 PM EDT by telephone and verified that I am speaking with the correct person using two identifiers.   I discussed the limitations, risks, security and privacy concerns of performing an evaluation and management service by telephone and the availability of in person appointments. I also discussed with the patient that there may be a patient responsible charge related to this service. The patient expressed understanding and agreed to proceed.  Chief Complaint  Patient presents with  . Hospitalization Follow-up    hasn't seen any blood  . Dysphagia    "food doesn't want to go down sometimes"     History of Present Illness: 63 year old female admitted in Feb 2020 with acute bronchitis and flu, week-long history of black stool. Heme positive on admission. Long-standing history of IDA, with last colonoscopy/EGD in 2016.   Food won't go down sometimes. Present for months. Difficulty swallowing if eating too fast. Mainly bread, hotdogs, hamburgers. No odynophagia. Points head up to swallow and it goes down. No melena. No abdominal pain. Appetite is "fine", trying to cut down on intake due to being in the house all the time. Nexium once per day, feels like it controls symptoms. If misses, can tell it. No NSAIDs other than Aleve if back is hurting bad. On average once or twice a month. Dilatations helped historically.   Past Medical History:  Diagnosis Date  .  Arthritis   . Chronic headaches   . Depression   . Diverticulosis   . Fibromyalgia   . GERD (gastroesophageal reflux disease)   . Goiter   . Hypertension   . Hypothyroidism   . Internal hemorrhoids   . Iron deficiency anemia    Dr. Deatra Ina 11/10  . Osteoporosis   . Ruptured lumbar disc   . Type 2 diabetes mellitus ([place])      Past Surgical History:  Procedure Laterality Date  . Arthroscopic left knee surgery Bilateral   . BACK SURGERY    . CHOLECYSTECTOMY N/A 11/21/2016   Procedure: LAPAROSCOPIC CHOLECYSTECTOMY;  Surgeon: Aviva Signs, MD;  Location: [facility];  Service: General;  Laterality: N/A;  . COLONOSCOPY  2016   Dr. Deatra Ina: diverticulosis  . DILATION AND CURETTAGE OF UTERUS    . ESOPHAGOGASTRODUODENOSCOPY  2016   Dr. Deatra Ina: early GE junction stricture s/p dilation   . THYROIDECTOMY N/A 06/06/2013   Procedure: TOTAL THYROIDECTOMY;  Surgeon: Jamesetta So, MD;  Location: [facility];  Service: General;  Laterality: N/A;     Current Meds  Medication Sig  . acetaminophen (TYLENOL) 325 MG tablet Take 2 tablets (650 mg total) by mouth every 6 (six) hours as needed for mild pain or headache (or Fever >/= 101).  Marland Kitchen amitriptyline (ELAVIL) 25 MG tablet Take 2 tablets by mouth at bedtime.  Marland Kitchen amLODipine (NORVASC) 5 MG tablet Take 10 mg by mouth daily.   . Artificial Tear Ointment (DRY EYES OP) Apply 1 drop to eye as needed.   . calcium carbonate (CALCIUM  600) 600 MG TABS tablet Take 600 mg by mouth 2 (two) times daily.  Marland Kitchen esomeprazole (NEXIUM) 40 MG capsule Take 1 capsule (40 mg total) by mouth 2 (two) times daily before a meal. (Patient taking differently: Take 40 mg by mouth daily. )  . fexofenadine (ALLEGRA) 180 MG tablet Take 180 mg by mouth daily.    . hydrochlorothiazide (HYDRODIURIL) 25 MG tablet Take 25 mg by mouth daily.  . insulin degludec (TRESIBA FLEXTOUCH) 100 UNIT/ML SOPN FlexTouch Pen Inject 0.7 mLs (70 Units total) into the skin at bedtime.  Marland Kitchen levothyroxine (SYNTHROID,  LEVOTHROID) 125 MCG tablet TAKE ONE TABLET EVERY MORNING  . metFORMIN (GLUCOPHAGE-XR) 500 MG 24 hr tablet TAKE ONE TABLET BY MOUTH TWICE DAILY  . ONETOUCH DELICA LANCETS 05R MISC 4 TIMES DAILY  . ONETOUCH VERIO test strip 4 TIMES DAILY  . Potassium 99 MG TABS Take 1 tablet by mouth 2 (two) times daily.  Marland Kitchen rOPINIRole (REQUIP) 2 MG tablet Take 2-4 mg by mouth at bedtime.       Review of Systems: Gen: Denies fever, chills, anorexia. Denies fatigue, weakness, weight loss.  CV: Denies chest pain, palpitations, syncope, peripheral edema, and claudication. Resp: Denies dyspnea at rest, cough, wheezing, coughing up blood, and pleurisy. GI: see HPI Derm: Denies rash, itching, dry skin Psych: Denies depression, anxiety, memory loss, confusion. No homicidal or suicidal ideation.  Heme: Denies bruising, bleeding, and enlarged lymph nodes.  Observations/Objective: No distress. Unable to perform physical exam due to telephone encounter. No video available.   Assessment and Plan: History of long-standing IDA, with last EGD/colonoscopy in 2016. Admitted in Feb 2020 to [facility] with heme positive stool, concern for melena. Notes chronic dysphagia to solid foods, and has had improvement with dilatations in the past. No overt signs of bleeding currently. While hospitalized, consideration raised for EGD with possible capsule study as outpatient after recovering from respiratory illness.  Discussed risks and benefits with patient in detail. Will need EGD/dilatation, +/- capsule study with Dr. Oneida Alar in near future. Propofol due to polypharmacy. Patient agreeable to this. Will discuss timing with Dr. Oneida Alar due to COVID restrictions.   4-6 month return.   Follow Up Instructions: See AVS   I discussed the assessment and treatment plan with the patient. The patient was provided an opportunity to ask questions and all were answered. The patient agreed with the plan and demonstrated an understanding of the  instructions.   The patient was advised to call back or seek an in-person evaluation if the symptoms worsen or if the condition fails to improve as anticipated.  I provided 10 minutes of non-face-to-face time during this encounter.  Annitta Needs, PhD, ANP-BC [facility]

## 2018-07-29 ENCOUNTER — Encounter: Payer: Self-pay | Admitting: Gastroenterology

## 2018-07-30 ENCOUNTER — Ambulatory Visit: Payer: Medicare Other | Admitting: Gastroenterology

## 2018-08-02 NOTE — Progress Notes (Signed)
cc'd to pcp 

## 2018-08-05 ENCOUNTER — Telehealth: Payer: Self-pay | Admitting: Gastroenterology

## 2018-08-05 NOTE — Telephone Encounter (Signed)
Noted. Will call once we receive [facility] august schedule to call patient

## 2018-08-05 NOTE — Telephone Encounter (Signed)
Dr .Darrick Penna reviewed office visit. EGD/dilatation with Propofol first available, with capsule placement. No indication for urgent endoscopy. Patient needs to follow a soft mechanical diet until procedure.

## 2018-08-30 ENCOUNTER — Telehealth: Payer: Self-pay

## 2018-08-30 ENCOUNTER — Other Ambulatory Visit: Payer: Self-pay

## 2018-08-30 DIAGNOSIS — R1310 Dysphagia, unspecified: Secondary | ICD-10-CM

## 2018-08-30 DIAGNOSIS — D508 Other iron deficiency anemias: Secondary | ICD-10-CM

## 2018-08-30 NOTE — Telephone Encounter (Signed)
Called pt, EGD/DIL/Givens placement w/Propofol w/SLF scheduled for 11/30/18 at 9:30am. Orders entered.

## 2018-08-31 NOTE — Telephone Encounter (Signed)
Pre-op appt 11/26/18 at 9:00am. Letter mailed with procedure instructions.

## 2018-09-01 LAB — HEMOGLOBIN A1C
HEMOGLOBIN A1C: 9.1 %{Hb} — AB (ref <5.7)
Mean Plasma Glucose: 214 (calc)
eAG (mmol/L): 11.9 (calc)

## 2018-09-01 LAB — T4, FREE: Free T4: 1.2 ng/dL (ref 0.8–1.8)

## 2018-09-01 LAB — TSH: TSH: 19.93 mIU/L — ABNORMAL HIGH (ref 0.40–4.50)

## 2018-09-01 NOTE — Telephone Encounter (Signed)
PA for EGD/DIL/Givens submitted via [facility] website. No PA required. Decision ID# [id number].

## 2018-09-09 ENCOUNTER — Encounter (INDEPENDENT_AMBULATORY_CARE_PROVIDER_SITE_OTHER): Payer: Self-pay

## 2018-09-09 ENCOUNTER — Other Ambulatory Visit: Payer: Self-pay

## 2018-09-09 ENCOUNTER — Ambulatory Visit (INDEPENDENT_AMBULATORY_CARE_PROVIDER_SITE_OTHER): Payer: Medicare Other | Admitting: "Endocrinology

## 2018-09-09 ENCOUNTER — Encounter: Payer: Self-pay | Admitting: "Endocrinology

## 2018-09-09 VITALS — BP 111/70 | HR 101 | Wt 189.0 lb

## 2018-09-09 DIAGNOSIS — E890 Postprocedural hypothyroidism: Secondary | ICD-10-CM | POA: Diagnosis not present

## 2018-09-09 DIAGNOSIS — Z794 Long term (current) use of insulin: Secondary | ICD-10-CM

## 2018-09-09 DIAGNOSIS — E782 Mixed hyperlipidemia: Secondary | ICD-10-CM | POA: Diagnosis not present

## 2018-09-09 DIAGNOSIS — N183 Chronic kidney disease, stage 3 unspecified: Secondary | ICD-10-CM

## 2018-09-09 DIAGNOSIS — I10 Essential (primary) hypertension: Secondary | ICD-10-CM | POA: Diagnosis not present

## 2018-09-09 DIAGNOSIS — E1122 Type 2 diabetes mellitus with diabetic chronic kidney disease: Secondary | ICD-10-CM

## 2018-09-09 MED ORDER — LEVOTHYROXINE SODIUM 137 MCG PO TABS: 125.0000 ug | ORAL_TABLET | Freq: Every morning | ORAL | 3 refills | Status: DC

## 2018-09-09 MED ORDER — INSULIN LISPRO (1 UNIT DIAL) 100 UNIT/ML (KWIKPEN): 10.0000 [IU] | PEN_INJECTOR | Freq: Three times a day (TID) | SUBCUTANEOUS | 2 refills | Status: DC

## 2018-09-09 MED ORDER — NOVOLOG FLEXPEN 100 UNIT/ML ~~LOC~~ SOPN: 10.0000 [IU] | PEN_INJECTOR | Freq: Three times a day (TID) | SUBCUTANEOUS | 2 refills | Status: DC

## 2018-09-09 NOTE — Progress Notes (Signed)
Endocrinology follow-up note  Subjective:    Patient ID: Linda Riggs, female    DOB: Oct 31, 1955, PCP Dione Housekeeper, MD   Past Medical History:  Diagnosis Date  . Arthritis   . Chronic headaches   . Depression   . Diverticulosis   . Fibromyalgia   . GERD (gastroesophageal reflux disease)   . Goiter   . Hypertension   . Hypothyroidism   . Internal hemorrhoids   . Iron deficiency anemia    Dr. Deatra Ina 11/10  . Osteoporosis   . Ruptured lumbar disc   . Type 2 diabetes mellitus ([place])    Past Surgical History:  Procedure Laterality Date  . Arthroscopic left knee surgery Bilateral   . BACK SURGERY    . CHOLECYSTECTOMY N/A 11/21/2016   Procedure: LAPAROSCOPIC CHOLECYSTECTOMY;  Surgeon: Aviva Signs, MD;  Location: [facility];  Service: General;  Laterality: N/A;  . COLONOSCOPY  2016   Dr. Deatra Ina: diverticulosis  . DILATION AND CURETTAGE OF UTERUS    . ESOPHAGOGASTRODUODENOSCOPY  2016   Dr. Deatra Ina: early GE junction stricture s/p dilation   . THYROIDECTOMY N/A 06/06/2013   Procedure: TOTAL THYROIDECTOMY;  Surgeon: Jamesetta So, MD;  Location: [facility];  Service: General;  Laterality: N/A;   Social History   Socioeconomic History  . Marital status: Single    Spouse name: Not on file  . Number of children: Not on file  . Years of education: Not on file  . Highest education level: Not on file  Occupational History  . Not on file  Social Needs  . Financial resource strain: Not on file  . Food insecurity    Worry: Not on file    Inability: Not on file  . Transportation needs    Medical: Not on file    Non-medical: Not on file  Tobacco Use  . Smoking status: Former Smoker    Packs/day: 0.50    Years: 20.00    Pack years: 10.00    Types: Cigarettes    Quit date: 04/01/2018    Years since quitting: 0.4  . Smokeless tobacco: Never Used  Substance and Sexual Activity  . Alcohol use: No    Alcohol/week: 0.0 standard drinks  . Drug use: No  . Sexual activity: Yes     Birth control/protection: Post-menopausal  Lifestyle  . Physical activity    Days per week: Not on file    Minutes per session: Not on file  . Stress: Not on file  Relationships  . Social Herbalist on phone: Not on file    Gets together: Not on file    Attends religious service: Not on file    Active member of club or organization: Not on file    Attends meetings of clubs or organizations: Not on file    Relationship status: Not on file  Other Topics Concern  . Not on file  Social History Narrative  . Not on file   Outpatient Encounter Medications as of 09/09/2018  Medication Sig  . acetaminophen (TYLENOL) 325 MG tablet Take 2 tablets (650 mg total) by mouth every 6 (six) hours as needed for mild pain or headache (or Fever >/= 101).  Marland Kitchen amitriptyline (ELAVIL) 25 MG tablet Take 2 tablets by mouth at bedtime.  Marland Kitchen amLODipine (NORVASC) 5 MG tablet Take 10 mg by mouth daily.   . Artificial Tear Ointment (DRY EYES OP) Apply 1 drop to eye as needed.   . calcium carbonate (CALCIUM 600)  600 MG TABS tablet Take 600 mg by mouth 2 (two) times daily.  Marland Kitchen. esomeprazole (NEXIUM) 40 MG capsule Take 1 capsule (40 mg total) by mouth 2 (two) times daily before a meal. (Patient taking differently: Take 40 mg by mouth daily. )  . fexofenadine (ALLEGRA) 180 MG tablet Take 180 mg by mouth daily.    . hydrochlorothiazide (HYDRODIURIL) 25 MG tablet Take 25 mg by mouth daily.  . insulin degludec (TRESIBA FLEXTOUCH) 100 UNIT/ML SOPN FlexTouch Pen Inject 0.7 mLs (70 Units total) into the skin at bedtime.  Marland Kitchen. levothyroxine (SYNTHROID) 137 MCG tablet Take 1 tablet (137 mcg total) by mouth every morning.  . metFORMIN (GLUCOPHAGE-XR) 500 MG 24 hr tablet TAKE ONE TABLET BY MOUTH TWICE DAILY  . ONETOUCH DELICA LANCETS 33G MISC 4 TIMES DAILY  . ONETOUCH VERIO test strip 4 TIMES DAILY  . Potassium 99 MG TABS Take 1 tablet by mouth 2 (two) times daily.  Marland Kitchen. rOPINIRole (REQUIP) 2 MG tablet Take 2-4 mg by mouth at  bedtime.   . [DISCONTINUED] insulin aspart (NOVOLOG FLEXPEN) 100 UNIT/ML FlexPen Inject 10-16 Units into the skin 3 (three) times daily before meals.  . [DISCONTINUED] levothyroxine (SYNTHROID, LEVOTHROID) 125 MCG tablet TAKE ONE TABLET EVERY MORNING   No facility-administered encounter medications on file as of 09/09/2018.    ALLERGIES: Allergies  Allergen Reactions  . Aspirin Itching  . Flonase [Fluticasone Propionate] Itching  . Prednisone Itching  . Tramadol Itching   VACCINATION STATUS:  There is no immunization history on file for this patient.  Diabetes She presents for her follow-up diabetic visit. She has type 2 diabetes mellitus. Her disease course has been worsening. Pertinent negatives for hypoglycemia include no confusion, headaches, pallor or seizures. Pertinent negatives for diabetes include no chest pain, no fatigue, no polydipsia, no polyphagia and no polyuria. Symptoms are worsening. There are no diabetic complications. Risk factors for coronary artery disease include diabetes mellitus, dyslipidemia, tobacco exposure, sedentary lifestyle and hypertension. Current diabetic treatment includes insulin injections and oral agent (dual therapy). She is compliant with treatment most of the time. Her weight is stable. She is following a generally unhealthy diet. When asked about meal planning, she reported none. She rarely participates in exercise. Blood glucose monitoring compliance is adequate. Her breakfast blood glucose range is generally 140-180 mg/dl. Her overall blood glucose range is 140-180 mg/dl. An ACE inhibitor/angiotensin II receptor blocker is being taken.  Thyroid Problem Presents for follow-up (She has postsurgical hypothyroidism.  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 Patient reports no cold intolerance, diarrhea, fatigue, heat intolerance or palpitations. The symptoms have been stable. Past treatments include  levothyroxine. Prior procedures include thyroidectomy. Her past medical history is significant for diabetes and hyperlipidemia.  Hypertension This is a chronic problem. The current episode started more than 1 year ago. The problem is controlled. Pertinent negatives include no chest pain, headaches, palpitations or shortness of breath. Risk factors for coronary artery disease include dyslipidemia, diabetes mellitus, smoking/tobacco exposure, sedentary lifestyle and post-menopausal state. Past treatments include ACE inhibitors. Identifiable causes of hypertension include a thyroid problem.  Hyperlipidemia This is a chronic problem. The current episode started more than 1 year ago. Recent lipid tests were reviewed and are high. Exacerbating diseases include diabetes. Pertinent negatives include no chest pain, myalgias or shortness of breath. Current antihyperlipidemic treatment includes statins. Risk factors for coronary artery disease include diabetes mellitus, dyslipidemia, hypertension, a sedentary lifestyle and post-menopausal.    Review  of Systems  Constitutional: Negative for fatigue and unexpected weight change.  HENT: Negative for trouble swallowing and voice change.   Eyes: Negative for visual disturbance.  Respiratory: Negative for cough, shortness of breath and wheezing.   Cardiovascular: Negative for chest pain, palpitations and leg swelling.  Gastrointestinal: Negative for diarrhea, nausea and vomiting.  Endocrine: Negative for cold intolerance, heat intolerance, polydipsia, polyphagia and polyuria.  Musculoskeletal: Negative for arthralgias and myalgias.  Skin: Negative for color change, pallor, rash and wound.  Neurological: Negative for seizures and headaches.  Psychiatric/Behavioral: Negative for confusion and suicidal ideas.    Objective:    BP 111/70   Pulse (!) 101   Wt 189 lb (85.7 kg)   BMI 33.48 kg/m   Wt Readings from Last 3 Encounters:  09/09/18 189 lb (85.7 kg)   06/08/18 187 lb (84.8 kg)  05/24/18 182 lb (82.6 kg)    Physical Exam  Constitutional: She is oriented to person, place, and time. She appears well-developed.  HENT:  Head: Normocephalic and atraumatic.  Eyes: EOM are normal.  Neck: Normal range of motion. Neck supple. No tracheal deviation present. No thyromegaly present.  Cardiovascular: Normal rate.  Pulmonary/Chest: Effort normal.  Abdominal: Bowel sounds are normal. There is no abdominal tenderness. There is no guarding.  Musculoskeletal: Normal range of motion.        General: No edema.  Neurological: She is alert and oriented to person, place, and time. No cranial nerve deficit. Coordination normal.  Skin: Skin is warm and dry. No rash noted. No erythema. No pallor.  Psychiatric: She has a normal mood and affect. Judgment normal.   Recent Results (from the past 2160 hour(s))  Hemoglobin A1c     Status: Abnormal   Collection Time: 08/31/18  9:37 AM  Result Value Ref Range   Hgb A1c MFr Bld 9.1 (H) <5.7 % of total Hgb    Comment: For someone without known diabetes, a hemoglobin A1c value of 6.5% or greater indicates that they may have  diabetes and this should be confirmed with a follow-up  test. . For someone with known diabetes, a value <7% indicates  that their diabetes is well controlled and a value  greater than or equal to 7% indicates suboptimal  control. A1c targets should be individualized based on  duration of diabetes, age, comorbid conditions, and  other considerations. . Currently, no consensus exists regarding use of hemoglobin A1c for diagnosis of diabetes for children. .    Mean Plasma Glucose 214 (calc)   eAG (mmol/L) 11.9 (calc)  T4, free     Status: None   Collection Time: 08/31/18  9:37 AM  Result Value Ref Range   Free T4 1.2 0.8 - 1.8 ng/dL  TSH     Status: Abnormal   Collection Time: 08/31/18  9:37 AM  Result Value Ref Range   TSH 19.93 (H) 0.40 - 4.50 mIU/L     Diabetic Labs (most  recent): Lab Results  Component Value Date   HGBA1C 9.1 (H) 08/31/2018   HGBA1C 8.8 (H) 06/01/2018   HGBA1C 7.1 (H) 01/26/2018   Lipid Panel     Component Value Date/Time   CHOL 184 07/07/2017 0928   TRIG 191 (H) 07/07/2017 0928   HDL 42 (L) 07/07/2017 0928   CHOLHDL 4.4 07/07/2017 0928   VLDL 33 (H) 03/13/2016 1015   LDLCALC 111 (H) 07/07/2017 0928      Assessment & Plan:   1. Uncontrolled type 2 diabetes mellitus with  complication, with long-term current use of insulin ([facility])  -She returns with worsening A1c of 9.1%, slowly increasing from 7.1%.   -Her diabetes is complicated by stage 3 renal insufficiency, chronic heavy smoking, sedentary lifestyle and patient remains at extremely high risk for more acute and chronic complications of diabetes which include CAD, CVA, CKD, retinopathy, and neuropathy. These are all discussed in detail with the patient.  Recent labs reviewed and discussed with her.   - I have re-counseled the patient on diet management   by adopting a carbohydrate restricted / protein rich  Diet.  - she  admits there is a room for improvement in her diet and drink choices. -  Suggestion is made for her to avoid simple carbohydrates  from her diet including Cakes, Sweet Desserts / Pastries, Ice Cream, Soda (diet and regular), Sweet Tea, Candies, Chips, Cookies, Sweet Pastries,  Store Bought Juices, Alcohol in Excess of  1-2 drinks a day, Artificial Sweeteners, Coffee Creamer, and "Sugar-free" Products. This will help patient to have stable blood glucose profile and potentially avoid unintended weight gain.  - Patient is advised to stick to a routine mealtimes to eat 3 meals  a day and avoid unnecessary snacks ( to snack only to correct hypoglycemia).   - I have approached patient with the following individualized plan to manage diabetes and patient agrees.  -She has lost control of diabetes.  She returns with controlled fasting blood glucose profile with A1c of  9.1 percent.  -She is advised to continue Tresiba 70 units nightly, discussed and added NovoLog 10-16 units 3 times daily AC associated with monitoring of blood glucose strictly 4 times a day-daily before breakfast and at bedtime.   -Patient is encouraged to call clinic for blood glucose levels less than 70 or above 200 mg /dl. -She is tolerating metformin, advised to continue metformin 500 mg p.o. twice daily after breakfast and supper.     - Patient specific target  for A1c; LDL, HDL, Triglycerides, and  Waist Circumference were discussed in detail.  2) BP/HTN: Her blood pressure is controlled to target.  She is advised to continue her current blood pressure medications including lisinopril 10 mg p.o. daily, hydrochlorothiazide 25 mg p.o. daily.   3) Lipids/HPL: Recent lipid panel showed uncontrolled LDL at 111.  She is advised to continue atorvastatin 80 mg p.o. nightly, and fenofibrate 160 mg p.o. Daily.  4)  Postsurgical hypothyroidism  -Her previsit labs did not include thyroid function tests.  She is advised to continue levothyroxine 125 mcg p.o. every morning.     - We discussed about the correct intake of her thyroid hormone, on empty stomach at fasting, with water, separated by at least 30 minutes from breakfast and other medications,  and separated by more than 4 hours from calcium, iron, multivitamins, acid reflux medications (PPIs). -Patient is made aware of the fact that thyroid hormone replacement is needed for life, dose to be adjusted by periodic monitoring of thyroid function tests.   5)  Weight/Diet:   declines to see the dietitian, exercise, and carbohydrates information provided.  6) Chronic Care/Health Maintenance:  -Patient is on ACEI/ARB and Statin medications and encouraged to continue to follow up with Ophthalmology, Podiatrist at least yearly or according to recommendations, and advised to quit smoking. I have recommended yearly flu vaccine and pneumonia vaccination  at least every 5 years; moderate intensity exercise for up to 150 minutes weekly; and  sleep for at least 7 hours a day. -  I advised patient to maintain close follow up with Joette Catching, MD for primary care needs. - Time spent with the patient: 25 min, of which >50% was spent in reviewing her blood glucose logs , discussing her hypoglycemia and hyperglycemia episodes, reviewing her current and  previous labs / studies and medications  doses and developing a plan to avoid hypoglycemia and hyperglycemia. Please refer to Patient Instructions for Blood Glucose Monitoring and Insulin/Medications Dosing Guide"  in media tab for additional information. Please  also refer to " Patient Self Inventory" in the Media  tab for reviewed elements of pertinent patient history.  Linda Riggs participated in the discussions, expressed understanding, and voiced agreement with the above plans.  All questions were answered to her satisfaction. she is encouraged to contact clinic should she have any questions or concerns prior to her return visit.    Follow up plan: -Return in about 3 months (around 12/10/2018) for Follow up with Pre-visit Labs, Meter, and Logs.  Marquis Lunch, MD Phone: [phone]  Fax: [phone]  -  This note was partially dictated with voice recognition software. Similar sounding words can be transcribed inadequately or may not  be corrected upon review.  09/09/2018, 1:08 PM

## 2018-09-09 NOTE — Patient Instructions (Signed)

## 2018-09-22 ENCOUNTER — Other Ambulatory Visit: Payer: Self-pay | Admitting: "Endocrinology

## 2018-09-22 DIAGNOSIS — F17210 Nicotine dependence, cigarettes, uncomplicated: Secondary | ICD-10-CM | POA: Insufficient documentation

## 2018-09-22 DIAGNOSIS — Z780 Asymptomatic menopausal state: Secondary | ICD-10-CM | POA: Insufficient documentation

## 2018-09-29 DIAGNOSIS — M1712 Unilateral primary osteoarthritis, left knee: Secondary | ICD-10-CM | POA: Insufficient documentation

## 2018-11-05 ENCOUNTER — Other Ambulatory Visit: Payer: Self-pay | Admitting: "Endocrinology

## 2018-11-19 ENCOUNTER — Other Ambulatory Visit (HOSPITAL_COMMUNITY): Payer: Self-pay | Admitting: Nurse Practitioner

## 2018-11-19 DIAGNOSIS — Z1231 Encounter for screening mammogram for malignant neoplasm of breast: Secondary | ICD-10-CM

## 2018-11-23 NOTE — Patient Instructions (Signed)
Linda Riggs  11/23/2018     @PREFPERIOPPHARMACY @   Your procedure is scheduled on  11/30/2018.  Report to [facility] at  0815  A.M.  Call this number if you have problems the morning of surgery:  [phone]   Remember:  Follow the diet instructions given to you by Dr Evelina Dun office.                        Take these medicines the morning of surgery with A SIP OF WATER  Amlodipine, wellbutrin, nexium, gabapentin, levothyroxine, maxalt(if needed). Take 1/2 of your usual night time insulin dosage. DO NOT take any medications for diabetes the morning of your procedure.    Do not wear jewelry, make-up or nail polish.  Do not wear lotions, powders, or perfumes. Please wear deodorant and brush your teeth.  Do not shave 48 hours prior to surgery.  Men may shave face and neck.  Do not bring valuables to the hospital.  [facility] is not responsible for any belongings or valuables.  Contacts, dentures or bridgework may not be worn into surgery.  Leave your suitcase in the car.  After surgery it may be brought to your room.  For patients admitted to the hospital, discharge time will be determined by your treatment team.  Patients discharged the day of surgery will not be allowed to drive home.   Name and phone number of your driver:   family Special instructions:  None  Please read over the following fact sheets that you were given. Anesthesia Post-op Instructions and Care and Recovery After Surgery       Upper Endoscopy, Adult, Care After This sheet gives you information about how to care for yourself after your procedure. Your health care provider may also give you more specific instructions. If you have problems or questions, contact your health care provider. What can I expect after the procedure? After the procedure, it is common to have:  A sore throat.  Mild stomach pain or discomfort.  Bloating.  Nausea. Follow these instructions at  home:   Follow instructions from your health care provider about what to eat or drink after your procedure.  Return to your normal activities as told by your health care provider. Ask your health care provider what activities are safe for you.  Take over-the-counter and prescription medicines only as told by your health care provider.  Do not drive for 24 hours if you were given a sedative during your procedure.  Keep all follow-up visits as told by your health care provider. This is important. Contact a health care provider if you have:  A sore throat that lasts longer than one day.  Trouble swallowing. Get help right away if:  You vomit blood or your vomit looks like coffee grounds.  You have: ? A fever. ? Bloody, black, or tarry stools. ? A severe sore throat or you cannot swallow. ? Difficulty breathing. ? Severe pain in your chest or abdomen. Summary  After the procedure, it is common to have a sore throat, mild stomach discomfort, bloating, and nausea.  Do not drive for 24 hours if you were given a sedative during the procedure.  Follow instructions from your health care provider about what to eat or drink after your procedure.  Return to your normal activities as told by your health care provider. This information is not intended to replace advice given to you  by your health care provider. Make sure you discuss any questions you have with your health care provider. Document Released: 09/16/2011 Document Revised: 09/08/2017 Document Reviewed: 08/17/2017 Elsevier Patient Education  2020 [place].  Esophageal Dilatation Esophageal dilatation, also called esophageal dilation, is a procedure to widen or open (dilate) a blocked or narrowed part of the esophagus. The esophagus is the part of the body that moves food and liquid from the mouth to the stomach. You may need this procedure if:  You have a buildup of scar tissue in your esophagus that makes it difficult,  painful, or impossible to swallow. This can be caused by gastroesophageal reflux disease (GERD).  You have cancer of the esophagus.  There is a problem with how food moves through your esophagus. In some cases, you may need this procedure repeated at a later time to dilate the esophagus gradually. Tell a health care provider about:  Any allergies you have.  All medicines you are taking, including vitamins, herbs, eye drops, creams, and over-the-counter medicines.  Any problems you or family members have had with anesthetic medicines.  Any blood disorders you have.  Any surgeries you have had.  Any medical conditions you have.  Any antibiotic medicines you are required to take before dental procedures.  Whether you are pregnant or may be pregnant. What are the risks? Generally, this is a safe procedure. However, problems may occur, including:  Bleeding due to a tear in the lining of the esophagus.  A hole (perforation) in the esophagus. What happens before the procedure?  Follow instructions from your health care provider about eating or drinking restrictions.  Ask your health care provider about changing or stopping your regular medicines. This is especially important if you are taking diabetes medicines or blood thinners.  Plan to have someone take you home from the hospital or clinic.  Plan to have a responsible adult care for you for at least 24 hours after you leave the hospital or clinic. This is important. What happens during the procedure?  You may be given a medicine to help you relax (sedative).  A numbing medicine may be sprayed into the back of your throat, or you may gargle the medicine.  Your health care provider may perform the dilatation using various surgical instruments, such as: ? Simple dilators. This instrument is carefully placed in the esophagus to stretch it. ? Guided wire bougies. This involves using an endoscope to insert a wire into the  esophagus. A dilator is passed over this wire to enlarge the esophagus. Then the wire is removed. ? Balloon dilators. An endoscope with a small balloon at the end is inserted into the esophagus. The balloon is inflated to stretch the esophagus and open it up. The procedure may vary among health care providers and hospitals. What happens after the procedure?  Your blood pressure, heart rate, breathing rate, and blood oxygen level will be monitored until the medicines you were given have worn off.  Your throat may feel slightly sore and numb. This will improve slowly over time.  You will not be allowed to eat or drink until your throat is no longer numb.  When you are able to drink, urinate, and sit on the edge of the bed without nausea or dizziness, you may be able to return home. Follow these instructions at home:  Take over-the-counter and prescription medicines only as told by your health care provider.  Do not drive for 24 hours if you were given a  sedative during your procedure.  You should have a responsible adult with you for 24 hours after the procedure.  Follow instructions from your health care provider about any eating or drinking restrictions.  Do not use any products that contain nicotine or tobacco, such as cigarettes and e-cigarettes. If you need help quitting, ask your health care provider.  Keep all follow-up visits as told by your health care provider. This is important. Get help right away if you:  Have a fever.  Have chest pain.  Have pain that is not relieved by medication.  Have trouble breathing.  Have trouble swallowing.  Vomit blood. Summary  Esophageal dilatation, also called esophageal dilation, is a procedure to widen or open (dilate) a blocked or narrowed part of the esophagus.  Plan to have someone take you home from the hospital or clinic.  For this procedure, a numbing medicine may be sprayed into the back of your throat, or you may gargle  the medicine.  Do not drive for 24 hours if you were given a sedative during your procedure. This information is not intended to replace advice given to you by your health care provider. Make sure you discuss any questions you have with your health care provider. Document Released: 05/08/2005 Document Revised: 02/27/2017 Document Reviewed: 01/20/2017 Elsevier Patient Education  2020 [employer] Monitored Anesthesia Care, Care After These instructions provide you with information about caring for yourself after your procedure. Your health care provider may also give you more specific instructions. Your treatment has been planned according to current medical practices, but problems sometimes occur. Call your health care provider if you have any problems or questions after your procedure. What can I expect after the procedure? After your procedure, you may:  Feel sleepy for several hours.  Feel clumsy and have poor balance for several hours.  Feel forgetful about what happened after the procedure.  Have poor judgment for several hours.  Feel nauseous or vomit.  Have a sore throat if you had a breathing tube during the procedure. Follow these instructions at home: For at least 24 hours after the procedure:      Have a responsible adult stay with you. It is important to have someone help care for you until you are awake and alert.  Rest as needed.  Do not: ? Participate in activities in which you could fall or become injured. ? Drive. ? Use heavy machinery. ? Drink alcohol. ? Take sleeping pills or medicines that cause drowsiness. ? Make important decisions or sign legal documents. ? Take care of children on your own. Eating and drinking  Follow the diet that is recommended by your health care provider.  If you vomit, drink water, juice, or soup when you can drink without vomiting.  Make sure you have little or no nausea before eating solid foods. General  instructions  Take over-the-counter and prescription medicines only as told by your health care provider.  If you have sleep apnea, surgery and certain medicines can increase your risk for breathing problems. Follow instructions from your health care provider about wearing your sleep device: ? Anytime you are sleeping, including during daytime naps. ? While taking prescription pain medicines, sleeping medicines, or medicines that make you drowsy.  If you smoke, do not smoke without supervision.  Keep all follow-up visits as told by your health care provider. This is important. Contact a health care provider if:  You keep feeling nauseous or you keep vomiting.  You feel light-headed.  You develop a rash.  You have a fever. Get help right away if:  You have trouble breathing. Summary  For several hours after your procedure, you may feel sleepy and have poor judgment.  Have a responsible adult stay with you for at least 24 hours or until you are awake and alert. This information is not intended to replace advice given to you by your health care provider. Make sure you discuss any questions you have with your health care provider. Document Released: 07/08/2015 Document Revised: 06/15/2017 Document Reviewed: 07/08/2015 Elsevier Patient Education  2020 [employer].

## 2018-11-26 ENCOUNTER — Other Ambulatory Visit: Payer: Self-pay

## 2018-11-26 ENCOUNTER — Encounter (HOSPITAL_COMMUNITY)
Admission: RE | Admit: 2018-11-26 | Discharge: 2018-11-26 | Disposition: A | Discharge status: Home / Self Care | Payer: Medicare Other | Source: Ambulatory Visit | Attending: Gastroenterology | Admitting: Gastroenterology

## 2018-11-26 ENCOUNTER — Other Ambulatory Visit (HOSPITAL_COMMUNITY)
Admission: RE | Admit: 2018-11-26 | Discharge: 2018-11-26 | Disposition: A | Discharge status: Home / Self Care | Payer: Medicare Other | Source: Ambulatory Visit | Attending: Gastroenterology | Admitting: Gastroenterology

## 2018-11-26 ENCOUNTER — Encounter (HOSPITAL_COMMUNITY): Payer: Self-pay

## 2018-11-26 DIAGNOSIS — Z20828 Contact with and (suspected) exposure to other viral communicable diseases: Secondary | ICD-10-CM | POA: Insufficient documentation

## 2018-11-26 DIAGNOSIS — Z01812 Encounter for preprocedural laboratory examination: Secondary | ICD-10-CM | POA: Insufficient documentation

## 2018-11-26 LAB — CBC
HCT: 37.4 % (ref 36.0–46.0)
Hemoglobin: 11.9 g/dL — ABNORMAL LOW (ref 12.0–15.0)
MCH: 28.7 pg (ref 26.0–34.0)
MCHC: 31.8 g/dL (ref 30.0–36.0)
MCV: 90.1 fL (ref 80.0–100.0)
Platelets: 381 10*3/uL (ref 150–400)
RBC: 4.15 MIL/uL (ref 3.87–5.11)
RDW: 15.9 % — ABNORMAL HIGH (ref 11.5–15.5)
WBC: 11.1 10*3/uL — ABNORMAL HIGH (ref 4.0–10.5)
nRBC: 0 % (ref 0.0–0.2)

## 2018-11-26 LAB — BASIC METABOLIC PANEL
Anion gap: 12 (ref 5–15)
BUN: 20 mg/dL (ref 8–23)
CO2: 22 mmol/L (ref 22–32)
Calcium: 9.3 mg/dL (ref 8.9–10.3)
Chloride: 103 mmol/L (ref 98–111)
Creatinine, Ser: 1.2 mg/dL — ABNORMAL HIGH (ref 0.44–1.00)
GFR calc Af Amer: 56 mL/min — ABNORMAL LOW (ref >60)
GFR calc non Af Amer: 48 mL/min — ABNORMAL LOW (ref >60)
Glucose, Bld: 109 mg/dL — ABNORMAL HIGH (ref 70–99)
Potassium: 4.6 mmol/L (ref 3.5–5.1)
Sodium: 137 mmol/L (ref 135–145)

## 2018-11-26 LAB — SARS CORONAVIRUS 2 (TAT 6-24 HRS): SARS Coronavirus 2: NEGATIVE

## 2018-11-29 NOTE — Progress Notes (Signed)
CC'D TO PCP °

## 2018-11-29 NOTE — Progress Notes (Signed)
cc'd to pcp 

## 2018-11-30 ENCOUNTER — Telehealth: Payer: Self-pay | Admitting: *Deleted

## 2018-11-30 ENCOUNTER — Ambulatory Visit (HOSPITAL_COMMUNITY): Payer: Medicare Other | Admitting: Anesthesiology

## 2018-11-30 ENCOUNTER — Encounter (HOSPITAL_COMMUNITY): Payer: Self-pay | Admitting: *Deleted

## 2018-11-30 ENCOUNTER — Other Ambulatory Visit: Payer: Self-pay

## 2018-11-30 ENCOUNTER — Ambulatory Visit (HOSPITAL_COMMUNITY)
Admission: RE | Admit: 2018-11-30 | Discharge: 2018-11-30 | Disposition: A | Discharge status: Home / Self Care | Payer: Medicare Other | Attending: Gastroenterology | Admitting: Gastroenterology

## 2018-11-30 ENCOUNTER — Encounter (HOSPITAL_COMMUNITY)
Admission: RE | Disposition: A | Discharge status: Home / Self Care | Payer: Self-pay | Source: Home / Self Care | Attending: Gastroenterology

## 2018-11-30 DIAGNOSIS — R1310 Dysphagia, unspecified: Secondary | ICD-10-CM | POA: Diagnosis present

## 2018-11-30 DIAGNOSIS — F329 Major depressive disorder, single episode, unspecified: Secondary | ICD-10-CM | POA: Diagnosis not present

## 2018-11-30 DIAGNOSIS — E119 Type 2 diabetes mellitus without complications: Secondary | ICD-10-CM | POA: Diagnosis not present

## 2018-11-30 DIAGNOSIS — Z886 Allergy status to analgesic agent status: Secondary | ICD-10-CM | POA: Insufficient documentation

## 2018-11-30 DIAGNOSIS — I10 Essential (primary) hypertension: Secondary | ICD-10-CM | POA: Diagnosis not present

## 2018-11-30 DIAGNOSIS — K228 Other specified diseases of esophagus: Secondary | ICD-10-CM | POA: Diagnosis not present

## 2018-11-30 DIAGNOSIS — M797 Fibromyalgia: Secondary | ICD-10-CM | POA: Insufficient documentation

## 2018-11-30 DIAGNOSIS — Z888 Allergy status to other drugs, medicaments and biological substances status: Secondary | ICD-10-CM | POA: Insufficient documentation

## 2018-11-30 DIAGNOSIS — F17210 Nicotine dependence, cigarettes, uncomplicated: Secondary | ICD-10-CM | POA: Diagnosis not present

## 2018-11-30 DIAGNOSIS — K319 Disease of stomach and duodenum, unspecified: Secondary | ICD-10-CM | POA: Insufficient documentation

## 2018-11-30 DIAGNOSIS — Z79890 Hormone replacement therapy (postmenopausal): Secondary | ICD-10-CM | POA: Insufficient documentation

## 2018-11-30 DIAGNOSIS — R195 Other fecal abnormalities: Secondary | ICD-10-CM | POA: Diagnosis not present

## 2018-11-30 DIAGNOSIS — K219 Gastro-esophageal reflux disease without esophagitis: Secondary | ICD-10-CM | POA: Insufficient documentation

## 2018-11-30 DIAGNOSIS — K317 Polyp of stomach and duodenum: Secondary | ICD-10-CM

## 2018-11-30 DIAGNOSIS — E039 Hypothyroidism, unspecified: Secondary | ICD-10-CM | POA: Insufficient documentation

## 2018-11-30 DIAGNOSIS — D509 Iron deficiency anemia, unspecified: Secondary | ICD-10-CM

## 2018-11-30 DIAGNOSIS — D508 Other iron deficiency anemias: Secondary | ICD-10-CM

## 2018-11-30 DIAGNOSIS — Z79899 Other long term (current) drug therapy: Secondary | ICD-10-CM | POA: Insufficient documentation

## 2018-11-30 DIAGNOSIS — M1990 Unspecified osteoarthritis, unspecified site: Secondary | ICD-10-CM | POA: Insufficient documentation

## 2018-11-30 DIAGNOSIS — K2950 Unspecified chronic gastritis without bleeding: Secondary | ICD-10-CM | POA: Diagnosis not present

## 2018-11-30 DIAGNOSIS — Z794 Long term (current) use of insulin: Secondary | ICD-10-CM | POA: Diagnosis not present

## 2018-11-30 HISTORY — PX: SAVORY DILATION: SHX5439

## 2018-11-30 HISTORY — PX: ESOPHAGOGASTRODUODENOSCOPY (EGD) WITH PROPOFOL: SHX5813

## 2018-11-30 LAB — GLUCOSE, CAPILLARY: Glucose-Capillary: 117 mg/dL — ABNORMAL HIGH (ref 70–99)

## 2018-11-30 SURGERY — ESOPHAGOGASTRODUODENOSCOPY (EGD) WITH PROPOFOL
Anesthesia: General

## 2018-11-30 MED ORDER — CHLORHEXIDINE GLUCONATE CLOTH 2 % EX PADS: 6.0000 | MEDICATED_PAD | Freq: Once | CUTANEOUS | Status: DC

## 2018-11-30 MED ORDER — PROPOFOL 500 MG/50ML IV EMUL
INTRAVENOUS | Status: DC | PRN
  Administered 2018-11-30: 200 ug/kg/min via INTRAVENOUS

## 2018-11-30 MED ORDER — PROPOFOL 10 MG/ML IV BOLUS
INTRAVENOUS | Status: AC
  Filled 2018-11-30: qty 20

## 2018-11-30 MED ORDER — MIDAZOLAM HCL 2 MG/2ML IJ SOLN: 0.5000 mg | Freq: Once | INTRAMUSCULAR | Status: DC | PRN

## 2018-11-30 MED ORDER — LIDOCAINE HCL (CARDIAC) PF 100 MG/5ML IV SOSY
PREFILLED_SYRINGE | INTRAVENOUS | Status: DC | PRN
  Administered 2018-11-30: 50 mg via INTRAVENOUS

## 2018-11-30 MED ORDER — ONDANSETRON HCL 4 MG/2ML IJ SOLN
INTRAMUSCULAR | Status: DC | PRN
  Administered 2018-11-30: 4 mg via INTRAVENOUS

## 2018-11-30 MED ORDER — LIDOCAINE VISCOUS HCL 2 % MT SOLN
5.0000 mL | Freq: Once | OROMUCOSAL | Status: AC
  Administered 2018-11-30: 5 mL via OROMUCOSAL

## 2018-11-30 MED ORDER — LACTATED RINGERS IV SOLN
INTRAVENOUS | Status: DC
  Administered 2018-11-30: 1000 mL via INTRAVENOUS

## 2018-11-30 MED ORDER — PROPOFOL 10 MG/ML IV BOLUS
INTRAVENOUS | Status: DC | PRN
  Administered 2018-11-30 (×2): 50 mg via INTRAVENOUS

## 2018-11-30 MED ORDER — MINERAL OIL PO OIL
TOPICAL_OIL | ORAL | Status: AC
  Filled 2018-11-30: qty 30

## 2018-11-30 MED ORDER — GLYCOPYRROLATE 0.2 MG/ML IJ SOLN
INTRAMUSCULAR | Status: DC | PRN
  Administered 2018-11-30: 0.2 mg via INTRAVENOUS

## 2018-11-30 MED ORDER — PROMETHAZINE HCL 25 MG/ML IJ SOLN: 6.2500 mg | INTRAMUSCULAR | Status: DC | PRN

## 2018-11-30 MED ORDER — LIDOCAINE VISCOUS HCL 2 % MT SOLN
OROMUCOSAL | Status: AC
  Filled 2018-11-30: qty 15

## 2018-11-30 MED ORDER — LIDOCAINE HCL (PF) 1 % IJ SOLN
INTRAMUSCULAR | Status: AC
  Filled 2018-11-30: qty 5

## 2018-11-30 NOTE — H&P (Addendum)
Primary Care Physician:  Jacqualine MauEdelen, Kevin B, NP Primary Gastroenterologist:  Dr. Darrick PennaFields  Pre-Procedure History & Physical: HPI:  Linda Riggs is a 63 y.o. female here for dysphagia. PT DENIES BRBPR OR MELENA. Hb NOW 11.9. LAST IVFE 2019.   Past Medical History:  Diagnosis Date  . Arthritis   . Chronic headaches   . Depression   . Diverticulosis   . Fibromyalgia   . GERD (gastroesophageal reflux disease)   . Goiter   . Hypertension   . Hypothyroidism   . Internal hemorrhoids   . Iron deficiency anemia    Dr. Arlyce DiceKaplan 11/10  . Osteoporosis   . Ruptured lumbar disc   . Type 2 diabetes mellitus (HCC)     Past Surgical History:  Procedure Laterality Date  . Arthroscopic left knee surgery Bilateral   . BACK SURGERY    . CHOLECYSTECTOMY N/A 11/21/2016   Procedure: LAPAROSCOPIC CHOLECYSTECTOMY;  Surgeon: Franky MachoJenkins, Mark, MD;  Location: [facility];  Service: General;  Laterality: N/A;  . COLONOSCOPY  2016   Dr. Arlyce DiceKaplan: diverticulosis  . DILATION AND CURETTAGE OF UTERUS    . ESOPHAGOGASTRODUODENOSCOPY  2016   Dr. Arlyce DiceKaplan: early GE junction stricture s/p dilation   . THYROIDECTOMY N/A 06/06/2013   Procedure: TOTAL THYROIDECTOMY;  Surgeon: Dalia HeadingMark A Jenkins, MD;  Location: [facility];  Service: General;  Laterality: N/A;    Prior to Admission medications   Medication Sig Start Date End Date Taking? Authorizing Provider  amitriptyline (ELAVIL) 25 MG tablet Take 50 mg by mouth at bedtime.  07/21/18  Yes [provider]  amLODipine (NORVASC) 10 MG tablet Take 10 mg by mouth daily. 08/24/18  Yes [provider]  Artificial Tear Ointment (DRY EYES OP) Place 1 drop into both eyes 3 (three) times daily as needed (dry/irritated eyes.).    Yes [provider]  atorvastatin (LIPITOR) 80 MG tablet Take 80 mg by mouth daily.   Yes [provider]  buPROPion (WELLBUTRIN SR) 150 MG 12 hr tablet Take 150 mg by mouth 2 (two) times daily. 10/27/18  Yes [provider]   calcium carbonate (OSCAL) 1500 (600 Ca) MG TABS tablet Take 600 mg by mouth 2 (two) times daily.   Yes [provider]  esomeprazole (NEXIUM) 40 MG capsule Take 1 capsule (40 mg total) by mouth 2 (two) times daily before a meal. Patient taking differently: Take 40 mg by mouth daily.  05/25/18  Yes Vassie LollMadera, Carlos, MD  fexofenadine (ALLEGRA) 180 MG tablet Take 180 mg by mouth daily.     Yes [provider]  gabapentin (NEURONTIN) 300 MG capsule Take 300 mg by mouth 2 (two) times daily. 09/22/18  Yes [provider]  insulin lispro (HUMALOG KWIKPEN) 100 UNIT/ML KwikPen Inject 0.1-0.16 mLs (10-16 Units total) into the skin 3 (three) times daily. Patient taking differently: Inject 10 Units into the skin 3 (three) times daily as needed (blood sugars greater than 170).  09/09/18  Yes Roma KayserNida, Gebreselassie W, MD  levothyroxine (SYNTHROID) 137 MCG tablet Take 1 tablet (137 mcg total) by mouth every morning. 09/09/18  Yes Nida, Denman GeorgeGebreselassie W, MD  lisinopril-hydrochlorothiazide (ZESTORETIC) 20-25 MG tablet Take 2 tablets by mouth daily. 09/22/18  Yes [provider]  metFORMIN (GLUCOPHAGE-XR) 500 MG 24 hr tablet TAKE ONE TABLET BY MOUTH TWICE DAILY Patient taking differently: Take 500 mg by mouth 2 (two) times daily.  09/22/18  Yes Nida, Denman GeorgeGebreselassie W, MD  ONETOUCH DELICA LANCETS 33G MISC 4 TIMES DAILY 01/23/17  Yes Roma Kayser, MD  [facility] VERIO test strip 4 TIMES DAILY 10/05/17  Yes Roma Kayser, MD  oxybutynin (DITROPAN-XL) 5 MG 24 hr tablet Take 5 mg by mouth at bedtime. 10/27/18  Yes [provider]  Potassium 99 MG TABS Take 99 mg by mouth 2 (two) times daily.   Yes [provider]  rizatriptan (MAXALT-MLT) 10 MG disintegrating tablet Take 10 mg by mouth every 2 (two) hours as needed for migraine. 09/09/18  Yes [provider]  rOPINIRole (REQUIP) 2 MG tablet Take 4 mg by mouth at bedtime.  01/06/17  Yes [provider]   simvastatin (ZOCOR) 40 MG tablet Take 40 mg by mouth every evening.  09/22/18  Yes [provider]  TRESIBA FLEXTOUCH 100 UNIT/ML SOPN FlexTouch Pen INJECT 0.7ML SQ AT BEDTIME Patient taking differently: Inject 70 Units into the skin at bedtime.  11/05/18  Yes Nida, Denman George, MD  insulin aspart (NOVOLOG FLEXPEN) 100 UNIT/ML FlexPen Inject 10-16 Units into the skin 3 (three) times daily before meals. 09/09/18 09/09/18  Roma Kayser, MD    Allergies as of 08/30/2018 - Review Complete 07/28/2018  Allergen Reaction Noted  . Aspirin Itching 09/20/2014  . Flonase [fluticasone propionate] Itching 09/06/2010  . Prednisone Itching 09/06/2010  . Tramadol Itching 09/06/2010    Family History  Problem Relation Age of Onset  . Diabetes type II Mother   . Alzheimer's disease Mother   . Diabetes type II Sister   . Diabetes Sister   . Diabetes type II Father   . Cancer Father        Lung  . Diabetes Brother   . Diabetes Sister   . Colon cancer Neg Hx     Social History   Socioeconomic History  . Marital status: Single    Spouse name: Not on file  . Number of children: Not on file  . Years of education: Not on file  . Highest education level: Not on file  Occupational History  . Not on file  Social Needs  . Financial resource strain: Not on file  . Food insecurity    Worry: Not on file    Inability: Not on file  . Transportation needs    Medical: Not on file    Non-medical: Not on file  Tobacco Use  . Smoking status: Current Every Day Smoker    Packs/day: 0.25    Years: 20.00    Pack years: 5.00    Types: Cigarettes  . Smokeless tobacco: Never Used  Substance and Sexual Activity  . Alcohol use: No    Alcohol/week: 0.0 standard drinks  . Drug use: No  . Sexual activity: Yes    Birth control/protection: Post-menopausal  Lifestyle  . Physical activity    Days per week: Not on file    Minutes per session: Not on file  . Stress: Not on file   Relationships  . Social Musician on phone: Not on file    Gets together: Not on file    Attends religious service: Not on file    Active member of club or organization: Not on file    Attends meetings of clubs or organizations: Not on file    Relationship status: Not on file  . Intimate partner violence    Fear of current or ex partner: Not on file    Emotionally abused: Not on file    Physically abused: Not on file    Forced  sexual activity: Not on file  Other Topics Concern  . Not on file  Social History Narrative  . Not on file    Review of Systems: See HPI, otherwise negative ROS   Physical Exam: BP (!) 112/56   Temp 97.7 F (36.5 C) (Oral)   Resp 14   SpO2 96%  General:   Alert,  pleasant and cooperative in NAD Head:  Normocephalic and atraumatic. Neck:  Supple; Lungs:  Clear throughout to auscultation.    Heart:  Regular rate and rhythm. Abdomen:  Soft, nontender and nondistended. Normal bowel sounds, without guarding, and without rebound.   Neurologic:  Alert and  oriented x4;  grossly normal neurologically.  Impression/Plan:     DYSPHAGIA  PLAN:  EGD/DIL TODAY. DISCUSSED PROCEDURE, BENEFITS, & RISKS: < 1% chance of medication reaction, bleeding, perforation, or ASPIRATION.

## 2018-11-30 NOTE — Telephone Encounter (Signed)
Lab orders on file for 03/01/2019.

## 2018-11-30 NOTE — Telephone Encounter (Signed)
[facility] called to inform us that pt would need labs for CBC and Ferritin in 3 months.  Please put in lab recall.

## 2018-11-30 NOTE — Anesthesia Postprocedure Evaluation (Signed)
Anesthesia Post Note Late Entry for 1030  Patient: Linda Riggs  Procedure(s) Performed: ESOPHAGOGASTRODUODENOSCOPY (EGD) WITH PROPOFOL (N/A ) SAVORY DILATION (N/A )  Patient location during evaluation: PACU Anesthesia Type: General Level of consciousness: awake and alert and oriented Pain management: pain level controlled Vital Signs Assessment: post-procedure vital signs reviewed and stable Respiratory status: spontaneous breathing Cardiovascular status: stable Postop Assessment: no apparent nausea or vomiting Anesthetic complications: no     Last Vitals:  Vitals:   11/30/18 1030 11/30/18 1045  BP: 106/75   Pulse: 72 65  Resp: 13   Temp:    SpO2: 97%     Last Pain:  Vitals:   11/30/18 1030  TempSrc:   PainSc: 0-No pain                 ADAMS, AMY A

## 2018-11-30 NOTE — Op Note (Addendum)
[facility] Patient Name: Linda Riggs Procedure Date: 11/30/2018 9:10 AM MRN: [id number] Date of Birth: 1956-01-16 Attending MD: Barney Drain MD, MD CSN: [id number] Age: 63 Admit Type: Outpatient Procedure:                Upper GI endoscopy WITH COLD FORCEPS                            BIOPSY/ESOPHAGEAL DILATION Indications:              Dysphagia-Hb IMPROVED SINCE FEB 2020 TO 11.9. Providers:                Barney Drain MD, MD, Jeanann Lewandowsky. Sharon Seller, RN,                            Randa Spike, Technician, Nelma Rothman, Technician Referring MD:             Vale Haven, NP Medicines:                Propofol per Anesthesia Complications:            No immediate complications. Estimated Blood Loss:     Estimated blood loss was minimal. Procedure:                Pre-Anesthesia Assessment:                           - Prior to the procedure, a History and Physical                            was performed, and patient medications and                            allergies were reviewed. The patient's tolerance of                            previous anesthesia was also reviewed. The risks                            and benefits of the procedure and the sedation                            options and risks were discussed with the patient.                            All questions were answered, and informed consent                            was obtained. Prior Anticoagulants: The patient has                            taken no previous anticoagulant or antiplatelet                            agents. ASA Grade Assessment: II - A patient with  mild systemic disease. After reviewing the risks                            and benefits, the patient was deemed in                            satisfactory condition to undergo the procedure.                            After obtaining informed consent, the endoscope was                            passed under direct vision.  Throughout the                            procedure, the patient's blood pressure, pulse, and                            oxygen saturations were monitored continuously. The                            GIF-H190 ([id number]) scope was introduced through the                            mouth, and advanced to the second part of duodenum.                            The upper GI endoscopy was accomplished without                            difficulty. The patient tolerated the procedure                            well. Scope In: 10:04:33 AM Scope Out: 10:11:33 AM Total Procedure Duration: 0 hours 7 minutes 0 seconds  Findings:      No endoscopic abnormality was evident in the esophagus to explain the       patient's complaint of dysphagia. It was decided, however, to proceed       with dilation of the entire esophagus DUE TO POSSIBLE PROXIMAL       ESOPHAGEAL WEB.Marland Kitchen A guidewire was placed and the scope was withdrawn.       Dilation was performed with a Savary dilator with mild resistance at 15       mm, 16 mm and 17 mm. Estimated blood loss: none.      A single 6 mm sessile polyp with no stigmata of recent bleeding was       found in the cardia. The polyp was removed with a cold biopsy forceps.       Resection and retrieval were complete.      Localized mild inflammation characterized by congestion (edema) and       erythema was found in the gastric antrum. Biopsies(2:BODY, 1:INCISURA,       2:ANTRUM) were taken with a cold forceps for Helicobacter pylori testing.      The examined duodenum was normal. Impression:               -  DYSPHAGIA DUE TO ESOPHAGEAL WEB V. UNCONTROLLED                            GERD.                           - A single gastric polyp. Resected and retrieved.                           - HEME POSITIVE STOOL LIKELY DUE TO MILD Gastritis. Moderate Sedation:      Per Anesthesia Care Recommendation:           - Patient has a contact number available for                             emergencies. The signs and symptoms of potential                            delayed complications were discussed with the                            patient. Return to normal activities tomorrow.                            Written discharge instructions were provided to the                            patient.                           - Low fat diet.                           - Continue present medications.                           - Await pathology results.                           - CBC/FERRITIN IN 3 MOS.                           - Return to GI office in 6 months. WILL PERFORM                            GIVENS CAPSULE IF PT DEVELOPS BRBPR, MELENA,OR A                            TRANSFUSION DEPENDENT ANEMIA./ Procedure Code(s):        --- Professional ---                           [phone], Esophagogastroduodenoscopy, flexible,                            transoral; with insertion of guide wire followed by  passage of dilator(s) through esophagus over guide                            wire                           43239, 59, Esophagogastroduodenoscopy, flexible,                            transoral; with biopsy, single or multiple Diagnosis Code(s):        --- Professional ---                           R13.10, Dysphagia, unspecified                           K31.7, Polyp of stomach and duodenum                           K29.70, Gastritis, unspecified, without bleeding CPT copyright 2019 [employer]. All rights reserved. The codes documented in this report are preliminary and upon coder review may  be revised to meet current compliance requirements. Jonette EvaSandi Fields, MD Jonette EvaSandi Fields MD, MD 11/30/2018 10:28:13 AM This report has been signed electronically. Number of Addenda: 0

## 2018-11-30 NOTE — Discharge Instructions (Signed)
I dilated your esophagus. I DID NOT SEE A DEFINITE NARROWING IN YOUR esophagus. You have MILD gastritis. I biopsied your stomach.   CONTINUE YOUR WEIGHT LOSS EFFORTS.   DRINK WATER TO KEEP YOUR URINE LIGHT YELLOW.  FOLLOW A LOW FAT DIET. DO NOT EAT FAST FOOD. MEATS SHOULD BE BAKED, BROILED, OR BOILED. AVOID FRIED FOODS. SEE INFO BELOW.  AVOID REFLUX TRIGGERS. SEE INFO BELOW.  CONTINUE NEXIUM. TAKE 30 MINUTES BEFORE MEALS ONCE OR TWICE DAILY.   YOUR BIOPSY RESULTS WILL BE BACK IN 5 BUSINESS DAYS.  YOU NEED A CBC/FERRITIN IN 3 MOS.  FOLLOW UP IN 6 MOS E30 DYSPHAGIA/DYSPEPSIA WITH DR. Niamh Rada.  UPPER ENDOSCOPY AFTER CARE Read the instructions outlined below and refer to this sheet in the next week. These discharge instructions provide you with general information on caring for yourself after you leave the hospital. While your treatment has been planned according to the most current medical practices available, unavoidable complications occasionally occur. If you have any problems or questions after discharge, call DR. Jervis Trapani, [phone].  ACTIVITY  You may resume your regular activity, but move at a slower pace for the next 24 hours.   Take frequent rest periods for the next 24 hours.   Walking will help get rid of the air and reduce the bloated feeling in your belly (abdomen).   No driving for 24 hours (because of the medicine (anesthesia) used during the test).   You may shower.   Do not sign any important legal documents or operate any machinery for 24 hours (because of the anesthesia used during the test).    NUTRITION  Drink plenty of fluids.   You may resume your normal diet as instructed by your doctor.   Begin with a light meal and progress to your normal diet. Heavy or fried foods are harder to digest and may make you feel sick to your stomach (nauseated).   Avoid alcoholic beverages for 24 hours or as instructed.    MEDICATIONS  You may resume your normal  medications.   WHAT YOU CAN EXPECT TODAY  Some feelings of bloating in the abdomen.   Passage of more gas than usual.    IF YOU HAD A BIOPSY TAKEN DURING THE UPPER ENDOSCOPY:  Eat a soft diet IF YOU HAVE NAUSEA, BLOATING, ABDOMINAL PAIN, OR VOMITING.    FINDING OUT THE RESULTS OF YOUR TEST Not all test results are available during your visit. DR. Darrick PennaFIELDS WILL CALL YOU WITHIN 14 DAYS OF YOUR PROCEDUE WITH YOUR RESULTS. Do not assume everything is normal if you have not heard from DR. Latasha Puskas, CALL HER OFFICE AT [phone].  SEEK IMMEDIATE MEDICAL ATTENTION AND CALL THE OFFICE: [phone] IF:  You have more than a spotting of blood in your stool.   Your belly is swollen (abdominal distention).   You are nauseated or vomiting.   You have a temperature over 101F.   You have abdominal pain or discomfort that is severe or gets worse throughout the day.  DYSPHAGIA DYSPHAGIA can be caused by stomach acid backing up into the tube that carries food from the mouth down to the stomach (lower esophagus).  TREATMENT There are a number of medicines used to treat DYSPHAGIA including: Antacids.  Proton-pump inhibitors: NEXIUM TAGAMET/PEPCID    Lifestyle and home remedies to control REFLUX and regurgitation  You may eliminate or reduce the frequency of heartburn by making the following lifestyle changes:   Control your weight. Being overweight is a major risk  factor for heartburn and GERD. Excess pounds put pressure on your abdomen, pushing up your stomach and causing acid to back up into your esophagus.    Eat smaller meals. 4 TO 6 MEALS A DAY. This reduces pressure on the lower esophageal sphincter, helping to prevent the valve from opening and acid from washing back into your esophagus.    Loosen your belt. Clothes that fit tightly around your waist put pressure on your abdomen and the lower esophageal sphincter.     Eliminate heartburn triggers. Everyone has specific  triggers. Common triggers such as fatty or fried foods, spicy food, tomato sauce, carbonated beverages, alcohol, chocolate, mint, garlic, onion, caffeine and nicotine may make heartburn worse.    Avoid stooping or bending. Tying your shoes is OK. Bending over for longer periods to weed your garden isn't, especially soon after eating.    Don't lie down after a meal. Wait at least three to four hours after eating before going to bed, and don't lie down right after eating.   Alternative medicine  Several home remedies exist for treating GERD, but they provide only temporary relief. They include drinking baking soda (sodium bicarbonate) added to water or drinking other fluids such as baking soda mixed with cream of tartar and water.  Although these liquids create temporary relief by neutralizing, washing away or buffering acids, eventually they aggravate the situation by adding gas and fluid to your stomach, increasing pressure and causing more acid reflux. Further, adding more sodium to your diet may increase your blood pressure and add stress to your heart, and excessive bicarbonate ingestion can alter the acid-base balance in your body.

## 2018-11-30 NOTE — Transfer of Care (Signed)
Immediate Anesthesia Transfer of Care Note  Patient: Linda Riggs  Procedure(s) Performed: ESOPHAGOGASTRODUODENOSCOPY (EGD) WITH PROPOFOL (N/A ) SAVORY DILATION (N/A )  Patient Location: PACU  Anesthesia Type:MAC  Level of Consciousness: awake, alert  and oriented  Airway & Oxygen Therapy: Patient Spontanous Breathing  Post-op Assessment: Report given to RN, Post -op Vital signs reviewed and stable and Patient moving all extremities X 4  Post vital signs: Reviewed and stable  Last Vitals:  Vitals Value Taken Time  BP    Temp    Pulse 75 11/30/18 1021  Resp 13 11/30/18 1021  SpO2 95 % 11/30/18 1021  Vitals shown include unvalidated device data.  Last Pain:  Vitals:   11/30/18 0957  TempSrc:   PainSc: 0-No pain      Patients Stated Pain Goal: 7 (24/23/53 6144)  Complications: No apparent anesthesia complications

## 2018-11-30 NOTE — Anesthesia Preprocedure Evaluation (Signed)
Anesthesia Evaluation  Patient identified by MRN, date of birth, ID band Patient awake    Reviewed: Allergy & Precautions, NPO status , Patient's Chart, lab work & pertinent test results  Airway Mallampati: II  TM Distance: >3 FB Neck ROM: Full    Dental no notable dental hx. (+) Teeth Intact, Poor Dentition   Pulmonary neg pulmonary ROS, Current Smoker and Patient abstained from smoking.,    Pulmonary exam normal breath sounds clear to auscultation       Cardiovascular Exercise Tolerance: Poor hypertension, negative cardio ROS Normal cardiovascular examII Rhythm:Regular Rate:Normal  States limited by LBP  Denies CP or DOE   Neuro/Psych  Headaches, Depression  Neuromuscular disease negative psych ROS   GI/Hepatic Neg liver ROS, GERD  Medicated and Controlled,  Endo/Other  diabetes, Type 1, Insulin Dependent, Oral Hypoglycemic AgentsHypothyroidism   Renal/GU Renal InsufficiencyRenal disease  negative genitourinary   Musculoskeletal  (+) Arthritis , Osteoarthritis,  Fibromyalgia -  Abdominal   Peds negative pediatric ROS (+)  Hematology negative hematology ROS (+) anemia ,   Anesthesia Other Findings   Reproductive/Obstetrics negative OB ROS                             Anesthesia Physical Anesthesia Plan  ASA: III  Anesthesia Plan: General   Post-op Pain Management:    Induction: Intravenous  PONV Risk Score and Plan: 2 and Propofol infusion, TIVA and Treatment may vary due to age or medical condition  Airway Management Planned: Nasal Cannula and Simple Face Mask  Additional Equipment:   Intra-op Plan:   Post-operative Plan:   Informed Consent: I have reviewed the patients History and Physical, chart, labs and discussed the procedure including the risks, benefits and alternatives for the proposed anesthesia with the patient or authorized representative who has indicated his/her  understanding and acceptance.     Dental advisory given  Plan Discussed with: CRNA  Anesthesia Plan Comments: (Plan Full PPE use  Plan GA with GETA as needed d/w pt -WTP with same after Q&A)        Anesthesia Quick Evaluation

## 2018-12-02 ENCOUNTER — Telehealth: Payer: Self-pay | Admitting: Gastroenterology

## 2018-12-02 ENCOUNTER — Encounter (HOSPITAL_COMMUNITY): Payer: Self-pay | Admitting: Gastroenterology

## 2018-12-02 NOTE — Telephone Encounter (Signed)
ON RECALL  °

## 2018-12-02 NOTE — Telephone Encounter (Addendum)
PLEASE CALL PT. SHE HAD A BENIGN POLYP REMOVED FROM HER STOMACH. HER stomach Bx shows gastritis.    CONTINUE YOUR WEIGHT LOSS EFFORTS.  DRINK WATER TO KEEP YOUR URINE LIGHT YELLOW. FOLLOW A LOW FAT DIET. DO NOT EAT FAST FOOD. MEATS SHOULD BE BAKED, BROILED, OR BOILED. AVOID FRIED FOODS.  AVOID REFLUX TRIGGERS.   CONTINUE NEXIUM. TAKE 30 MINUTES BEFORE MEALS ONCE OR TWICE DAILY.  YOU NEED A CBC/FERRITIN IN 3 MOS.  FOLLOW UP IN 6 MOS E30 DYSPHAGIA/DYSPEPSIA WITH DR. Raeven Pint.

## 2018-12-07 ENCOUNTER — Other Ambulatory Visit: Payer: Self-pay

## 2018-12-07 DIAGNOSIS — D509 Iron deficiency anemia, unspecified: Secondary | ICD-10-CM

## 2018-12-07 NOTE — Telephone Encounter (Signed)
Pt is aware of results and plan. Labs on file for 03/08/2019.

## 2018-12-08 LAB — COMPLETE METABOLIC PANEL WITH GFR
AG Ratio: 1.4 (calc) (ref 1.0–2.5)
ALT: 32 U/L — ABNORMAL HIGH (ref 6–29)
AST: 27 U/L (ref 10–35)
Albumin: 3.6 g/dL (ref 3.6–5.1)
Alkaline phosphatase (APISO): 73 U/L (ref 37–153)
BUN/Creatinine Ratio: 18 (calc) (ref 6–22)
BUN: 26 mg/dL — ABNORMAL HIGH (ref 7–25)
CO2: 22 mmol/L (ref 20–32)
Calcium: 8.6 mg/dL (ref 8.6–10.4)
Chloride: 102 mmol/L (ref 98–110)
Creat: 1.43 mg/dL — ABNORMAL HIGH (ref 0.50–0.99)
GFR, Est African American: 45 mL/min/{1.73_m2} — ABNORMAL LOW (ref >=60)
GFR, Est Non African American: 39 mL/min/{1.73_m2} — ABNORMAL LOW (ref >=60)
Globulin: 2.6 g/dL (calc) (ref 1.9–3.7)
Glucose, Bld: 104 mg/dL — ABNORMAL HIGH (ref 65–99)
Potassium: 5.1 mmol/L (ref 3.5–5.3)
Sodium: 135 mmol/L (ref 135–146)
Total Bilirubin: 0.4 mg/dL (ref 0.2–1.2)
Total Protein: 6.2 g/dL (ref 6.1–8.1)

## 2018-12-08 LAB — HEMOGLOBIN A1C
Hgb A1c MFr Bld: 6.5 % of total Hgb — ABNORMAL HIGH (ref <5.7)
Mean Plasma Glucose: 140 (calc)
eAG (mmol/L): 7.7 (calc)

## 2018-12-08 LAB — TSH: TSH: 6.63 mIU/L — ABNORMAL HIGH (ref 0.40–4.50)

## 2018-12-08 LAB — VITAMIN D 25 HYDROXY (VIT D DEFICIENCY, FRACTURES): Vit D, 25-Hydroxy: 26 ng/mL — ABNORMAL LOW (ref 30–100)

## 2018-12-08 LAB — T4, FREE: Free T4: 1.4 ng/dL (ref 0.8–1.8)

## 2018-12-14 ENCOUNTER — Encounter: Payer: Self-pay | Admitting: "Endocrinology

## 2018-12-14 ENCOUNTER — Other Ambulatory Visit: Payer: Self-pay | Admitting: "Endocrinology

## 2018-12-14 ENCOUNTER — Ambulatory Visit (INDEPENDENT_AMBULATORY_CARE_PROVIDER_SITE_OTHER): Payer: Medicare Other | Admitting: "Endocrinology

## 2018-12-14 ENCOUNTER — Other Ambulatory Visit: Payer: Self-pay

## 2018-12-14 DIAGNOSIS — E1122 Type 2 diabetes mellitus with diabetic chronic kidney disease: Secondary | ICD-10-CM | POA: Diagnosis not present

## 2018-12-14 DIAGNOSIS — E039 Hypothyroidism, unspecified: Secondary | ICD-10-CM

## 2018-12-14 DIAGNOSIS — N183 Chronic kidney disease, stage 3 (moderate): Secondary | ICD-10-CM

## 2018-12-14 DIAGNOSIS — E890 Postprocedural hypothyroidism: Secondary | ICD-10-CM | POA: Diagnosis not present

## 2018-12-14 DIAGNOSIS — Z794 Long term (current) use of insulin: Secondary | ICD-10-CM

## 2018-12-14 DIAGNOSIS — E782 Mixed hyperlipidemia: Secondary | ICD-10-CM | POA: Diagnosis not present

## 2018-12-14 DIAGNOSIS — I10 Essential (primary) hypertension: Secondary | ICD-10-CM | POA: Diagnosis not present

## 2018-12-14 MED ORDER — TRESIBA FLEXTOUCH 100 UNIT/ML ~~LOC~~ SOPN: 70.0000 [IU] | PEN_INJECTOR | Freq: Every day | SUBCUTANEOUS | 2 refills | Status: DC

## 2018-12-14 MED ORDER — INSULIN LISPRO (1 UNIT DIAL) 100 UNIT/ML (KWIKPEN): 10.0000 [IU] | PEN_INJECTOR | Freq: Three times a day (TID) | SUBCUTANEOUS | 2 refills | Status: DC

## 2018-12-14 MED ORDER — LEVOTHYROXINE SODIUM 150 MCG PO TABS: 150.0000 ug | ORAL_TABLET | Freq: Every morning | ORAL | 1 refills | Status: DC

## 2018-12-14 NOTE — Progress Notes (Signed)
12/14/2018                                                    Endocrinology Telehealth Visit Follow up Note -During COVID -19 Pandemic  This visit type was conducted due to national recommendations for restrictions regarding the COVID-19 Pandemic  in an effort to limit this patient's exposure and mitigate transmission of the corona virus.  Due to her co-morbid illnesses, Linda Riggs is at  moderate to high risk for complications without adequate follow up.  This format is felt to be most appropriate for her at this time.  I connected with this patient on 12/14/2018   by telephone and verified that I am speaking with the correct person using two identifiers. Linda Riggs, [birth year]. she has verbally consented to this visit. All issues noted in this document were discussed and addressed. The format was not optimal for physical exam.    Subjective:    Patient ID: Linda Riggs, female    DOB: 1955/09/14, PCP Jacqualine Mau, NP   Past Medical History:  Diagnosis Date  . Arthritis   . Chronic headaches   . Depression   . Diverticulosis   . Fibromyalgia   . GERD (gastroesophageal reflux disease)   . Goiter   . Hypertension   . Hypothyroidism   . Internal hemorrhoids   . Iron deficiency anemia    Dr. Arlyce Dice 11/10  . Osteoporosis   . Ruptured lumbar disc   . Type 2 diabetes mellitus (HCC)    Past Surgical History:  Procedure Laterality Date  . Arthroscopic left knee surgery Bilateral   . BACK SURGERY    . CHOLECYSTECTOMY N/A 11/21/2016   Procedure: LAPAROSCOPIC CHOLECYSTECTOMY;  Surgeon: Franky Macho, MD;  Location: [facility];  Service: General;  Laterality: N/A;  . COLONOSCOPY  2016   Dr. Arlyce Dice: diverticulosis  . DILATION AND CURETTAGE OF UTERUS    . ESOPHAGOGASTRODUODENOSCOPY  2016   Dr. Arlyce Dice: early GE junction stricture s/p dilation   . ESOPHAGOGASTRODUODENOSCOPY (EGD) WITH PROPOFOL N/A 11/30/2018   Procedure: ESOPHAGOGASTRODUODENOSCOPY (EGD) WITH PROPOFOL;   Surgeon: West Bali, MD;  Location: [facility];  Service: Endoscopy;  Laterality: N/A;  9:30am  . SAVORY DILATION N/A 11/30/2018   Procedure: SAVORY DILATION;  Surgeon: West Bali, MD;  Location: [facility];  Service: Endoscopy;  Laterality: N/A;  . THYROIDECTOMY N/A 06/06/2013   Procedure: TOTAL THYROIDECTOMY;  Surgeon: Dalia Heading, MD;  Location: [facility];  Service: General;  Laterality: N/A;   Social History   Socioeconomic History  . Marital status: Single    Spouse name: Not on file  . Number of children: Not on file  . Years of education: Not on file  . Highest education level: Not on file  Occupational History  . Not on file  Social Needs  . Financial resource strain: Not on file  . Food insecurity    Worry: Not on file    Inability: Not on file  . Transportation needs    Medical: Not on file    Non-medical: Not on file  Tobacco Use  . Smoking status: Current Every Day Smoker    Packs/day: 0.25    Years: 20.00    Pack years: 5.00    Types: Cigarettes  . Smokeless tobacco: Never Used  Substance and Sexual Activity  . Alcohol use: No    Alcohol/week: 0.0 standard drinks  . Drug use: No  . Sexual activity: Yes    Birth control/protection: Post-menopausal  Lifestyle  . Physical activity    Days per week: Not on file    Minutes per session: Not on file  . Stress: Not on file  Relationships  . Social Herbalist on phone: Not on file    Gets together: Not on file    Attends religious service: Not on file    Active member of club or organization: Not on file    Attends meetings of clubs or organizations: Not on file    Relationship status: Not on file  Other Topics Concern  . Not on file  Social History Narrative  . Not on file   Outpatient Encounter Medications as of 12/14/2018  Medication Sig  . amitriptyline (ELAVIL) 25 MG tablet Take 50 mg by mouth at bedtime.   Marland Kitchen amLODipine (NORVASC) 10 MG tablet Take 10 mg by mouth daily.  .  Artificial Tear Ointment (DRY EYES OP) Place 1 drop into both eyes 3 (three) times daily as needed (dry/irritated eyes.).   Marland Kitchen atorvastatin (LIPITOR) 80 MG tablet Take 80 mg by mouth daily.  Marland Kitchen buPROPion (WELLBUTRIN SR) 150 MG 12 hr tablet Take 150 mg by mouth 2 (two) times daily.  . calcium carbonate (OSCAL) 1500 (600 Ca) MG TABS tablet Take 600 mg by mouth 2 (two) times daily.  Marland Kitchen esomeprazole (NEXIUM) 40 MG capsule Take 1 capsule (40 mg total) by mouth 2 (two) times daily before a meal. (Patient taking differently: Take 40 mg by mouth daily. )  . fexofenadine (ALLEGRA) 180 MG tablet Take 180 mg by mouth daily.    Marland Kitchen gabapentin (NEURONTIN) 300 MG capsule Take 300 mg by mouth 2 (two) times daily.  . insulin degludec (TRESIBA FLEXTOUCH) 100 UNIT/ML SOPN FlexTouch Pen Inject 0.7 mLs (70 Units total) into the skin at bedtime.  . insulin lispro (HUMALOG KWIKPEN) 100 UNIT/ML KwikPen Inject 0.1-0.16 mLs (10-16 Units total) into the skin 3 (three) times daily.  Marland Kitchen levothyroxine (SYNTHROID) 150 MCG tablet Take 1 tablet (150 mcg total) by mouth every morning.  Marland Kitchen lisinopril-hydrochlorothiazide (ZESTORETIC) 20-25 MG tablet Take 2 tablets by mouth daily.  . metFORMIN (GLUCOPHAGE-XR) 500 MG 24 hr tablet TAKE ONE TABLET BY MOUTH TWICE DAILY (Patient taking differently: Take 500 mg by mouth 2 (two) times daily. )  . ONETOUCH DELICA LANCETS 96E MISC 4 TIMES DAILY  . ONETOUCH VERIO test strip 4 TIMES DAILY  . oxybutynin (DITROPAN-XL) 5 MG 24 hr tablet Take 5 mg by mouth at bedtime.  . Potassium 99 MG TABS Take 99 mg by mouth 2 (two) times daily.  . rizatriptan (MAXALT-MLT) 10 MG disintegrating tablet Take 10 mg by mouth every 2 (two) hours as needed for migraine.  Marland Kitchen rOPINIRole (REQUIP) 2 MG tablet Take 4 mg by mouth at bedtime.   . simvastatin (ZOCOR) 40 MG tablet Take 40 mg by mouth every evening.   . [DISCONTINUED] insulin aspart (NOVOLOG FLEXPEN) 100 UNIT/ML FlexPen Inject 10-16 Units into the skin 3 (three) times  daily before meals.  . [DISCONTINUED] insulin lispro (HUMALOG KWIKPEN) 100 UNIT/ML KwikPen Inject 0.1-0.16 mLs (10-16 Units total) into the skin 3 (three) times daily. (Patient taking differently: Inject 10 Units into the skin 3 (three) times daily as needed (blood sugars greater than 170). )  . [DISCONTINUED] levothyroxine (SYNTHROID) 137 MCG  tablet Take 1 tablet (137 mcg total) by mouth every morning.  . [DISCONTINUED] TRESIBA FLEXTOUCH 100 UNIT/ML SOPN FlexTouch Pen INJECT 0.7ML SQ AT BEDTIME (Patient taking differently: Inject 70 Units into the skin at bedtime. )   No facility-administered encounter medications on file as of 12/14/2018.    ALLERGIES: Allergies  Allergen Reactions  . Aspirin Itching  . Flonase [Fluticasone Propionate] Itching  . Prednisone Itching  . Tramadol Itching   VACCINATION STATUS:  There is no immunization history on file for this patient.  Diabetes She presents for her follow-up diabetic visit. She has type 2 diabetes mellitus. Her disease course has been improving. Pertinent negatives for hypoglycemia include no confusion, headaches, pallor or seizures. Pertinent negatives for diabetes include no chest pain, no fatigue, no polydipsia, no polyphagia and no polyuria. Symptoms are improving. There are no diabetic complications. Risk factors for coronary artery disease include diabetes mellitus, dyslipidemia, tobacco exposure, sedentary lifestyle and hypertension. Current diabetic treatment includes insulin injections and oral agent (dual therapy). She is compliant with treatment most of the time. She is following a generally unhealthy diet. When asked about meal planning, she reported none. She rarely participates in exercise. Blood glucose monitoring compliance is adequate. Her home blood glucose trend is decreasing steadily. Her breakfast blood glucose range is generally 140-180 mg/dl. Her lunch blood glucose range is generally 140-180 mg/dl. Her dinner blood glucose  range is generally 140-180 mg/dl. Her bedtime blood glucose range is generally 140-180 mg/dl. Her overall blood glucose range is 140-180 mg/dl. An ACE inhibitor/angiotensin II receptor blocker is being taken.  Thyroid Problem Presents for follow-up (She has postsurgical hypothyroidism.  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 Patient reports no cold intolerance, diarrhea, fatigue, heat intolerance or palpitations. The symptoms have been stable. Past treatments include levothyroxine. Prior procedures include thyroidectomy. Her past medical history is significant for diabetes and hyperlipidemia.  Hypertension This is a chronic problem. The current episode started more than 1 year ago. The problem is controlled. Pertinent negatives include no chest pain, headaches, palpitations or shortness of breath. Risk factors for coronary artery disease include dyslipidemia, diabetes mellitus, smoking/tobacco exposure, sedentary lifestyle and post-menopausal state. Past treatments include ACE inhibitors. Identifiable causes of hypertension include a thyroid problem.  Hyperlipidemia This is a chronic problem. The current episode started more than 1 year ago. Recent lipid tests were reviewed and are high. Exacerbating diseases include diabetes. Pertinent negatives include no chest pain, myalgias or shortness of breath. Current antihyperlipidemic treatment includes statins. Risk factors for coronary artery disease include diabetes mellitus, dyslipidemia, hypertension, a sedentary lifestyle and post-menopausal.    Review of systems: Limited as above.  Objective:    There were no vitals taken for this visit.  Wt Readings from Last 3 Encounters:  11/26/18 190 lb (86.2 kg)  09/09/18 189 lb (85.7 kg)  06/08/18 187 lb (84.8 kg)     Recent Results (from the past 2160 hour(s))  SARS CORONAVIRUS 2 (TAT 6-12 HRS) Nasal Swab Aptima Multi Swab     Status: None    Collection Time: 11/26/18  6:57 AM   Specimen: Aptima Multi Swab; Nasal Swab  Result Value Ref Range   SARS Coronavirus 2 NEGATIVE NEGATIVE    Comment: (NOTE) SARS-CoV-2 target nucleic acids are NOT DETECTED. The SARS-CoV-2 RNA is generally detectable in upper and lower respiratory specimens during the acute phase of infection. Negative results do not preclude SARS-CoV-2 infection, do not rule out co-infections with other  pathogens, and should not be used as the sole basis for treatment or other patient management decisions. Negative results must be combined with clinical observations, patient history, and epidemiological information. The expected result is Negative. Fact Sheet for Patients: HairSlick.nohttps://www.fda.gov/media/138098/download Fact Sheet for Healthcare Providers: quierodirigir.comhttps://www.fda.gov/media/138095/download This test is not yet approved or cleared by the [place] FDA and  has been authorized for detection and/or diagnosis of SARS-CoV-2 by FDA under an Emergency Use Authorization (EUA). This EUA will remain  in effect (meaning this test can be used) for the duration of the COVID-19 declaration under Section 56 4(b)(1) of the Act, 21 U.S.C. section 360bbb-3(b)(1), unless the authorization is terminated or revoked sooner. Performed at [facility], [address] [place], [place] [address]   CBC     Status: Abnormal   Collection Time: 11/26/18  9:07 AM  Result Value Ref Range   WBC 11.1 (H) 4.0 - 10.5 K/uL   RBC 4.15 3.87 - 5.11 MIL/uL   Hemoglobin 11.9 (L) 12.0 - 15.0 g/dL   HCT 98.137.4 19.136.0 - 47.846.0 %   MCV 90.1 80.0 - 100.0 fL   MCH 28.7 26.0 - 34.0 pg   MCHC 31.8 30.0 - 36.0 g/dL   RDW 29.515.9 (H) 62.111.5 - 30.815.5 %   Platelets 381 150 - 400 K/uL   nRBC 0.0 0.0 - 0.2 %    Comment: Performed at [facility], [address] [place], [place] [address]  Basic metabolic panel     Status: Abnormal   Collection Time: 11/26/18  9:07 AM  Result Value Ref Range   Sodium 137 135  - 145 mmol/L   Potassium 4.6 3.5 - 5.1 mmol/L   Chloride 103 98 - 111 mmol/L   CO2 22 22 - 32 mmol/L   Glucose, Bld 109 (H) 70 - 99 mg/dL   BUN 20 8 - 23 mg/dL   Creatinine, Ser 6.961.20 (H) 0.44 - 1.00 mg/dL   Calcium 9.3 8.9 - 29.510.3 mg/dL   GFR calc non Af Amer 48 (L) >60 mL/min   GFR calc Af Amer 56 (L) >60 mL/min   Anion gap 12 5 - 15    Comment: Performed at [facility], [address] [place], [place] [address]  Glucose, capillary     Status: Abnormal   Collection Time: 11/30/18  8:38 AM  Result Value Ref Range   Glucose-Capillary 117 (H) 70 - 99 mg/dL  Hemoglobin [id number]     Status: Abnormal   Collection Time: 12/07/18  8:31 AM  Result Value Ref Range   Hgb A1c MFr Bld 6.5 (H) <5.7 % of total Hgb    Comment: For someone without known diabetes, a hemoglobin A1c value of 6.5% or greater indicates that they may have  diabetes and this should be confirmed with a follow-up  test. . For someone with known diabetes, a value <7% indicates  that their diabetes is well controlled and a value  greater than or equal to 7% indicates suboptimal  control. A1c targets should be individualized based on  duration of diabetes, age, comorbid conditions, and  other considerations. . Currently, no consensus exists regarding use of hemoglobin A1c for diagnosis of diabetes for children. .    Mean Plasma Glucose 140 (calc)   eAG (mmol/L) 7.7 (calc)  COMPLETE METABOLIC PANEL WITH GFR     Status: Abnormal   Collection Time: 12/07/18  8:31 AM  Result Value Ref Range   Glucose, Bld 104 (H) 65 - 99  mg/dL    Comment: .            Fasting reference interval . For someone without known diabetes, a glucose value between 100 and 125 mg/dL is consistent with prediabetes and should be confirmed with a follow-up test. .    BUN 26 (H) 7 - 25 mg/dL   Creat 1.61 (H) 0.96 - 0.99 mg/dL    Comment: For patients >33 years of age, the reference limit for Creatinine is approximately 13% higher for  people identified as African-American. .    GFR, Est Non African American 39 (L) > OR = 60 mL/min/1.42m2   GFR, Est African American 45 (L) > OR = 60 mL/min/1.16m2   BUN/Creatinine Ratio 18 6 - 22 (calc)   Sodium 135 135 - 146 mmol/L   Potassium 5.1 3.5 - 5.3 mmol/L   Chloride 102 98 - 110 mmol/L   CO2 22 20 - 32 mmol/L   Calcium 8.6 8.6 - 10.4 mg/dL   Total Protein 6.2 6.1 - 8.1 g/dL   Albumin 3.6 3.6 - 5.1 g/dL   Globulin 2.6 1.9 - 3.7 g/dL (calc)   AG Ratio 1.4 1.0 - 2.5 (calc)   Total Bilirubin 0.4 0.2 - 1.2 mg/dL   Alkaline phosphatase (APISO) 73 37 - 153 U/L   AST 27 10 - 35 U/L   ALT 32 (H) 6 - 29 U/L  TSH     Status: Abnormal   Collection Time: 12/07/18  8:31 AM  Result Value Ref Range   TSH 6.63 (H) 0.40 - 4.50 mIU/L  T4, free     Status: None   Collection Time: 12/07/18  8:31 AM  Result Value Ref Range   Free T4 1.4 0.8 - 1.8 ng/dL  VITAMIN D 25 Hydroxy (Vit-D Deficiency, Fractures)     Status: Abnormal   Collection Time: 12/07/18  8:31 AM  Result Value Ref Range   Vit D, 25-Hydroxy 26 (L) 30 - 100 ng/mL    Comment: Vitamin D Status         25-OH Vitamin D: . Deficiency:                    <20 ng/mL Insufficiency:             20 - 29 ng/mL Optimal:                 > or = 30 ng/mL . For 25-OH Vitamin D testing on patients on  D2-supplementation and patients for whom quantitation  of D2 and D3 fractions is required, the Quest[employer]ureD(TM) 25-OH VIT D, (D2,D3), LC/MS/MS is recommended: order  code 04540 (patients >56yrs). See Note 1 . Note 1 . For additional information, please refer to  http://education.[facility].com/faq/FAQ199  (This link is being provided for informational/ educational purposes only.)      Diabetic Labs (most recent): Lab Results  Component Value Date   HGBA1C 6.5 (H) 12/07/2018   HGBA1C 9.1 (H) 08/31/2018   HGBA1C 8.8 (H) 06/01/2018   Lipid Panel     Component Value Date/Time   CHOL 184 07/07/2017 0928   TRIG 191 (H)  07/07/2017 0928   HDL 42 (L) 07/07/2017 0928   CHOLHDL 4.4 07/07/2017 0928   VLDL 33 (H) 03/13/2016 1015   LDLCALC 111 (H) 07/07/2017 0928      Assessment & Plan:   1. Uncontrolled type 2 diabetes mellitus with complication, with long-term current use of insulin (HCC)  -She reports controlled glycemic profile  to near target, and A1c of 6.5% improving from 9.1%.  -Her diabetes is complicated by stage 3 renal insufficiency, chronic heavy smoking, sedentary lifestyle and patient remains at extremely high risk for more acute and chronic complications of diabetes which include CAD, CVA, CKD, retinopathy, and neuropathy. These are all discussed in detail with the patient.  Recent labs reviewed and discussed with her.   - I have re-counseled the patient on diet management   by adopting a carbohydrate restricted / protein rich  Diet.  - she  admits there is a room for improvement in her diet and drink choices. -  Suggestion is made for her to avoid simple carbohydrates  from her diet including Cakes, Sweet Desserts / Pastries, Ice Cream, Soda (diet and regular), Sweet Tea, Candies, Chips, Cookies, Sweet Pastries,  Store Bought Juices, Alcohol in Excess of  1-2 drinks a day, Artificial Sweeteners, Coffee Creamer, and "Sugar-free" Products. This will help patient to have stable blood glucose profile and potentially avoid unintended weight gain.   - Patient is advised to stick to a routine mealtimes to eat 3 meals  a day and avoid unnecessary snacks ( to snack only to correct hypoglycemia).   - I have approached patient with the following individualized plan to manage diabetes and patient agrees.  -She has achieved control of diabetes again.    -She is advised to continue Tresiba 70 units nightly, continue NovoLog 10-16 units 3 times daily AC associated with monitoring of blood glucose strictly 4 times a day-daily before breakfast and at bedtime.   -Patient is encouraged to call clinic for  blood glucose levels less than 70 or above 200 mg /dl. -She is tolerating metformin, advised to continue metformin 500 mg p.o. twice daily- after breakfast and supper.       - Patient specific target  for A1c; LDL, HDL, Triglycerides, and  Waist Circumference were discussed in detail.  2) BP/HTN: she is advised to home monitor blood pressure and report if > 140/90 on 2 separate readings.  She is advised to continue her current blood pressure medications including lisinopril 10 mg p.o. daily, hydrochlorothiazide 25 mg p.o. daily.   3) Lipids/HPL: Recent lipid panel showed uncontrolled LDL at 111.  She is advised to continue atorvastatin 80 mg p.o. nightly, and fenofibrate 160 mg p.o. Daily.  4)  Postsurgical hypothyroidism  -Her previsit labs are consistent with under replacement.  She is approached for higher dose of levothyroxine.  I discussed and increase her levothyroxine to 150 mcg p.o. daily before breakfast.   - We discussed about the correct intake of her thyroid hormone, on empty stomach at fasting, with water, separated by at least 30 minutes from breakfast and other medications,  and separated by more than 4 hours from calcium, iron, multivitamins, acid reflux medications (PPIs). -Patient is made aware of the fact that thyroid hormone replacement is needed for life, dose to be adjusted by periodic monitoring of thyroid function tests.   5)  Weight/Diet:   declines to see the dietitian, exercise, and carbohydrates information provided.  6) Chronic Care/Health Maintenance:  -Patient is on ACEI/ARB and Statin medications and encouraged to continue to follow up with Ophthalmology, Podiatrist at least yearly or according to recommendations, and advised to quit smoking. I have recommended yearly flu vaccine and pneumonia vaccination at least every 5 years; moderate intensity exercise for up to 150 minutes weekly; and  sleep for at least 7 hours a day. - I advised patient to  maintain  close follow up with Jacqualine Mau, NP for primary care needs.  - Patient Care Time Today:  25 min, of which >50% was spent in  counseling and the rest reviewing her  current and  previous labs/studies, previous treatments, her blood glucose readings, and medications' doses and developing a plan for long-term care based on the latest recommendations for standards of care.   Valeta Minor Lofaro participated in the discussions, expressed understanding, and voiced agreement with the above plans.  All questions were answered to her satisfaction. she is encouraged to contact clinic should she have any questions or concerns prior to her return visit.   Follow up plan: -Return in about 4 months (around 04/15/2019) for Bring Meter and Logs- A1c in Office, Include 8 log sheets.  Marquis Lunch, MD Phone: [phone]  Fax: [phone]  -  This note was partially dictated with voice recognition software. Similar sounding words can be transcribed inadequately or may not  be corrected upon review.  12/14/2018, 11:17 AM

## 2018-12-27 ENCOUNTER — Ambulatory Visit (HOSPITAL_COMMUNITY)
Admission: RE | Admit: 2018-12-27 | Discharge: 2018-12-27 | Disposition: A | Discharge status: Home / Self Care | Payer: Medicare Other | Source: Ambulatory Visit | Attending: Nurse Practitioner | Admitting: Nurse Practitioner

## 2018-12-27 ENCOUNTER — Other Ambulatory Visit: Payer: Self-pay

## 2018-12-27 DIAGNOSIS — Z1231 Encounter for screening mammogram for malignant neoplasm of breast: Secondary | ICD-10-CM | POA: Diagnosis not present

## 2018-12-29 ENCOUNTER — Ambulatory Visit: Payer: Medicare Other | Admitting: Gastroenterology

## 2019-02-17 ENCOUNTER — Other Ambulatory Visit: Payer: Self-pay

## 2019-02-17 DIAGNOSIS — D509 Iron deficiency anemia, unspecified: Secondary | ICD-10-CM

## 2019-03-04 ENCOUNTER — Other Ambulatory Visit: Payer: Self-pay | Admitting: "Endocrinology

## 2019-03-17 LAB — HEMOGLOBIN A1C: Hemoglobin A1C: 6.6

## 2019-03-31 ENCOUNTER — Other Ambulatory Visit: Payer: Self-pay | Admitting: "Endocrinology

## 2019-04-18 ENCOUNTER — Ambulatory Visit: Payer: Medicare Other | Admitting: "Endocrinology

## 2019-04-18 ENCOUNTER — Other Ambulatory Visit: Payer: Self-pay

## 2019-04-18 ENCOUNTER — Encounter: Payer: Self-pay | Admitting: "Endocrinology

## 2019-04-18 VITALS — BP 128/67 | HR 88 | Ht 62.0 in | Wt 193.4 lb

## 2019-04-18 DIAGNOSIS — Z794 Long term (current) use of insulin: Secondary | ICD-10-CM

## 2019-04-18 DIAGNOSIS — I10 Essential (primary) hypertension: Secondary | ICD-10-CM

## 2019-04-18 DIAGNOSIS — E890 Postprocedural hypothyroidism: Secondary | ICD-10-CM | POA: Diagnosis not present

## 2019-04-18 DIAGNOSIS — E1121 Type 2 diabetes mellitus with diabetic nephropathy: Secondary | ICD-10-CM

## 2019-04-18 DIAGNOSIS — E782 Mixed hyperlipidemia: Secondary | ICD-10-CM | POA: Diagnosis not present

## 2019-04-18 DIAGNOSIS — N1831 Chronic kidney disease, stage 3a: Secondary | ICD-10-CM

## 2019-04-18 MED ORDER — FREESTYLE LIBRE 14 DAY SENSOR MISC: 1.0000 | 2 refills | Status: AC

## 2019-04-18 MED ORDER — TRESIBA FLEXTOUCH 100 UNIT/ML ~~LOC~~ SOPN: 60.0000 [IU] | PEN_INJECTOR | Freq: Every day | SUBCUTANEOUS | 2 refills | Status: DC

## 2019-04-18 MED ORDER — FREESTYLE LIBRE 14 DAY READER DEVI: 1.0000 | Freq: Once | 0 refills | Status: AC

## 2019-04-18 NOTE — Patient Instructions (Signed)
                                     Advice for Weight Management  -For most of us the best way to lose weight is by diet management. Generally speaking, diet management means consuming less calories intentionally which over time brings about progressive weight loss.  This can be achieved more effectively by restricting carbohydrate consumption to the minimum possible.  So, it is critically important to know your numbers: how much calorie you are consuming and how much calorie you need. More importantly, our carbohydrates sources should be unprocessed or minimally processed complex starch food items.   Sometimes, it is important to balance nutrition by increasing protein intake (animal or plant source), fruits, and vegetables.  -Sticking to a routine mealtime to eat 3 meals a day and avoiding unnecessary snacks is shown to have a big role in weight control. Under normal circumstances, the only time we lose real weight is when we are hungry, so allow hunger to take place- hunger means no food between meal times, only water.  It is not advisable to starve.   -It is better to avoid simple carbohydrates including: Cakes, Sweet Desserts, Ice Cream, Soda (diet and regular), Sweet Tea, Candies, Chips, Cookies, Store Bought Juices, Alcohol in Excess of  1-2 drinks a day, Artificial Sweeteners, Doughnuts, Coffee Creamers, "Sugar-free" Products, etc, etc.  This is not a complete list.....    -Consulting with certified diabetes educators is proven to provide you with the most accurate and current information on diet.  Also, you may be  interested in discussing diet options/exchanges , we can schedule a visit with Penny Crumpton, RDN, CDE for individualized nutrition education.  -Exercise: If you are able: 30 -60 minutes a day ,4 days a week, or 150 minutes a week.  The longer the better.  Combine stretch, strength, and aerobic activities.  If you were told in the past that you  have high risk for cardiovascular diseases, you may seek evaluation by your heart doctor prior to initiating moderate to intense exercise programs.                                  Additional Care Considerations for Diabetes   -Diabetes  is a chronic disease.  The most important care consideration is regular follow-up with your diabetes care provider with the goal being avoiding or delaying its complications and to take advantage of advances in medications and technology.    -Type 2 diabetes is known to coexist with other important comorbidities such as high blood pressure and high cholesterol.  It is critical to control not only the diabetes but also the high blood pressure and high cholesterol to minimize and delay the risk of complications including coronary artery disease, stroke, amputations, blindness, etc.    - Studies showed that people with diabetes will benefit from a class of medications known as ACE inhibitors and statins.  Unless there are specific reasons not to be on these medications, the standard of care is to consider getting one from these groups of medications at an optimal doses.  These medications are generally considered safe and proven to help protect the heart and the kidneys.    - People with diabetes are encouraged to initiate and maintain regular follow-up with eye doctors, foot doctors, dentists ,   and if necessary heart and kidney doctors.     - It is highly recommended that people with diabetes quit smoking or stay away from smoking, and get yearly  flu vaccine and pneumonia vaccine at least every 5 years.  One other important lifestyle recommendation is to ensure adequate sleep - at least 6-7 hours of uninterrupted sleep at night.  -Exercise: If you are able: 30 -60 minutes a day, 4 days a week, or 150 minutes a week.  The longer the better.  Combine stretch, strength, and aerobic activities.  If you were told in the past that you have high risk for cardiovascular  diseases, you may seek evaluation by your heart doctor prior to initiating moderate to intense exercise programs.     COVID-19 Vaccine Information can be found at: [email or web address] For questions related to vaccine distribution or appointments, please email [email or web address] or call [phone].        

## 2019-04-18 NOTE — Progress Notes (Signed)
04/18/2019   Endocrinology follow-up note   Subjective:    Patient ID: Linda Riggs, female    DOB: [birth year], PCP Jacqualine Mau, NP   Past Medical History:  Diagnosis Date  . Arthritis   . Chronic headaches   . Depression   . Diverticulosis   . Fibromyalgia   . GERD (gastroesophageal reflux disease)   . Goiter   . Hypertension   . Hypothyroidism   . Internal hemorrhoids   . Iron deficiency anemia    Dr. Arlyce Dice 11/10  . Osteoporosis   . Ruptured lumbar disc   . Type 2 diabetes mellitus (HCC)    Past Surgical History:  Procedure Laterality Date  . Arthroscopic left knee surgery Bilateral   . BACK SURGERY    . CHOLECYSTECTOMY N/A 11/21/2016   Procedure: LAPAROSCOPIC CHOLECYSTECTOMY;  Surgeon: Franky Macho, MD;  Location: [facility];  Service: General;  Laterality: N/A;  . COLONOSCOPY  2016   Dr. Arlyce Dice: diverticulosis  . DILATION AND CURETTAGE OF UTERUS    . ESOPHAGOGASTRODUODENOSCOPY  2016   Dr. Arlyce Dice: early GE junction stricture s/p dilation   . ESOPHAGOGASTRODUODENOSCOPY (EGD) WITH PROPOFOL N/A 11/30/2018   Procedure: ESOPHAGOGASTRODUODENOSCOPY (EGD) WITH PROPOFOL;  Surgeon: West Bali, MD;  Location: [facility];  Service: Endoscopy;  Laterality: N/A;  9:30am  . SAVORY DILATION N/A 11/30/2018   Procedure: SAVORY DILATION;  Surgeon: West Bali, MD;  Location: [facility];  Service: Endoscopy;  Laterality: N/A;  . THYROIDECTOMY N/A 06/06/2013   Procedure: TOTAL THYROIDECTOMY;  Surgeon: Dalia Heading, MD;  Location: [facility];  Service: General;  Laterality: N/A;   Social History   Socioeconomic History  . Marital status: Single    Spouse name: Not on file  . Number of children: Not on file  . Years of education: Not on file  . Highest education level: Not on file  Occupational History  . Not on file  Tobacco Use  . Smoking status: Current Every Day Smoker    Packs/day: 0.25    Years: 20.00    Pack years: 5.00    Types: Cigarettes  .  Smokeless tobacco: Never Used  Substance and Sexual Activity  . Alcohol use: No    Alcohol/week: 0.0 standard drinks  . Drug use: No  . Sexual activity: Yes    Birth control/protection: Post-menopausal  Other Topics Concern  . Not on file  Social History Narrative  . Not on file   Social Determinants of Health   Financial Resource Strain:   . Difficulty of Paying Living Expenses: Not on file  Food Insecurity:   . Worried About Programme researcher, broadcasting/film/video in the Last Year: Not on file  . Ran Out of Food in the Last Year: Not on file  Transportation Needs:   . Lack of Transportation (Medical): Not on file  . Lack of Transportation (Non-Medical): Not on file  Physical Activity:   . Days of Exercise per Week: Not on file  . Minutes of Exercise per Session: Not on file  Stress:   . Feeling of Stress : Not on file  Social Connections:   . Frequency of Communication with Friends and Family: Not on file  . Frequency of Social Gatherings with Friends and Family: Not on file  . Attends Religious Services: Not on file  . Active Member of Clubs or Organizations: Not on file  . Attends Banker Meetings: Not on file  . Marital Status: Not on  file   Outpatient Encounter Medications as of 04/18/2019  Medication Sig  . amitriptyline (ELAVIL) 25 MG tablet Take 50 mg by mouth at bedtime.   Marland Kitchen amLODipine (NORVASC) 10 MG tablet Take 10 mg by mouth daily.  . Artificial Tear Ointment (DRY EYES OP) Place 1 drop into both eyes 3 (three) times daily as needed (dry/irritated eyes.).   Marland Kitchen atorvastatin (LIPITOR) 80 MG tablet Take 80 mg by mouth daily.  Marland Kitchen buPROPion (WELLBUTRIN SR) 150 MG 12 hr tablet Take 150 mg by mouth 2 (two) times daily.  . calcium carbonate (OSCAL) 1500 (600 Ca) MG TABS tablet Take 600 mg by mouth 2 (two) times daily.  . Continuous Blood Gluc Receiver (FREESTYLE LIBRE 14 DAY READER) DEVI 1 each by Does not apply route once for 1 dose.  . Continuous Blood Gluc Sensor (FREESTYLE  LIBRE 14 DAY SENSOR) MISC Inject 1 each into the skin every 14 (fourteen) days. Use as directed.  Marland Kitchen esomeprazole (NEXIUM) 40 MG capsule Take 1 capsule (40 mg total) by mouth 2 (two) times daily before a meal. (Patient taking differently: Take 40 mg by mouth daily. )  . fexofenadine (ALLEGRA) 180 MG tablet Take 180 mg by mouth daily.    Marland Kitchen gabapentin (NEURONTIN) 300 MG capsule Take 300 mg by mouth 2 (two) times daily.  . insulin degludec (TRESIBA FLEXTOUCH) 100 UNIT/ML SOPN FlexTouch Pen Inject 0.6 mLs (60 Units total) into the skin at bedtime.  . insulin lispro (HUMALOG KWIKPEN) 100 UNIT/ML KwikPen Inject 0.1-0.16 mLs (10-16 Units total) into the skin 3 (three) times daily.  Marland Kitchen levothyroxine (SYNTHROID) 150 MCG tablet TAKE ONE TABLET EVERY MORNING  . lisinopril-hydrochlorothiazide (ZESTORETIC) 20-25 MG tablet Take 2 tablets by mouth daily.  . metFORMIN (GLUCOPHAGE-XR) 500 MG 24 hr tablet TAKE ONE TABLET BY MOUTH TWICE DAILY  . ONETOUCH DELICA LANCETS 41P MISC 4 TIMES DAILY  . ONETOUCH VERIO test strip 4 TIMES DAILY  . oxybutynin (DITROPAN-XL) 5 MG 24 hr tablet Take 5 mg by mouth at bedtime.  . Potassium 99 MG TABS Take 99 mg by mouth 2 (two) times daily.  . rizatriptan (MAXALT-MLT) 10 MG disintegrating tablet Take 10 mg by mouth every 2 (two) hours as needed for migraine.  Marland Kitchen rOPINIRole (REQUIP) 2 MG tablet Take 4 mg by mouth at bedtime.   . simvastatin (ZOCOR) 40 MG tablet Take 40 mg by mouth every evening.   . [DISCONTINUED] insulin aspart (NOVOLOG FLEXPEN) 100 UNIT/ML FlexPen Inject 10-16 Units into the skin 3 (three) times daily before meals.  . [DISCONTINUED] insulin degludec (TRESIBA FLEXTOUCH) 100 UNIT/ML SOPN FlexTouch Pen Inject 0.7 mLs (70 Units total) into the skin at bedtime.   No facility-administered encounter medications on file as of 04/18/2019.   ALLERGIES: Allergies  Allergen Reactions  . Aspirin Itching  . Flonase [Fluticasone Propionate] Itching  . Prednisone Itching  .  Tramadol Itching   VACCINATION STATUS:  There is no immunization history on file for this patient.  Diabetes She presents for her follow-up diabetic visit. She has type 2 diabetes mellitus. Her disease course has been worsening. Pertinent negatives for hypoglycemia include no confusion, headaches, pallor or seizures. Pertinent negatives for diabetes include no chest pain, no fatigue, no polydipsia, no polyphagia and no polyuria. Symptoms are improving. There are no diabetic complications. Risk factors for coronary artery disease include diabetes mellitus, dyslipidemia, tobacco exposure, sedentary lifestyle and hypertension. Current diabetic treatment includes insulin injections and oral agent (dual therapy). She is compliant with treatment most  of the time. Her weight is increasing steadily. She is following a generally unhealthy diet. When asked about meal planning, she reported none. She rarely participates in exercise. She monitors blood glucose at home 3-4 x per day. Blood glucose monitoring compliance is adequate. Her home blood glucose trend is fluctuating minimally. Her breakfast blood glucose range is generally 130-140 mg/dl. Her lunch blood glucose range is generally 140-180 mg/dl. Her dinner blood glucose range is generally 140-180 mg/dl. Her bedtime blood glucose range is generally 140-180 mg/dl. Her overall blood glucose range is 140-180 mg/dl. An ACE inhibitor/angiotensin II receptor blocker is being taken.  Thyroid Problem Presents for follow-up (She has postsurgical hypothyroidism.  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 Patient reports no cold intolerance, diarrhea, fatigue, heat intolerance or palpitations. The symptoms have been stable. Past treatments include levothyroxine. Prior procedures include thyroidectomy. Her past medical history is significant for diabetes and hyperlipidemia.  Hypertension This is a chronic problem. The  current episode started more than 1 year ago. The problem is controlled. Pertinent negatives include no chest pain, headaches, palpitations or shortness of breath. Risk factors for coronary artery disease include dyslipidemia, diabetes mellitus, smoking/tobacco exposure, sedentary lifestyle and post-menopausal state. Past treatments include ACE inhibitors. Identifiable causes of hypertension include a thyroid problem.  Hyperlipidemia This is a chronic problem. The current episode started more than 1 year ago. Recent lipid tests were reviewed and are high. Exacerbating diseases include diabetes. Pertinent negatives include no chest pain, myalgias or shortness of breath. Current antihyperlipidemic treatment includes statins. Risk factors for coronary artery disease include diabetes mellitus, dyslipidemia, hypertension, a sedentary lifestyle and post-menopausal.    Review of systems:  Limited as above.  Objective:    BP 128/67   Pulse 88   Ht 5\' 2"  (1.575 m)   Wt 193 lb 6.4 oz (87.7 kg)   BMI 35.37 kg/m   Wt Readings from Last 3 Encounters:  04/18/19 193 lb 6.4 oz (87.7 kg)  11/26/18 190 lb (86.2 kg)  09/09/18 189 lb (85.7 kg)     Recent Results (from the past 2160 hour(s))  Hemoglobin A1c     Status: None   Collection Time: 03/17/19 12:00 AM  Result Value Ref Range   Hemoglobin A1C 6.6      Diabetic Labs (most recent): Lab Results  Component Value Date   HGBA1C 6.6 03/17/2019   HGBA1C 6.5 (H) 12/07/2018   HGBA1C 9.1 (H) 08/31/2018   Lipid Panel     Component Value Date/Time   CHOL 184 07/07/2017 0928   TRIG 191 (H) 07/07/2017 0928   HDL 42 (L) 07/07/2017 0928   CHOLHDL 4.4 07/07/2017 0928   VLDL 33 (H) 03/13/2016 1015   LDLCALC 111 (H) 07/07/2017 0928      Assessment & Plan:   1. Uncontrolled type 2 diabetes mellitus with complication, with long-term current use of insulin (HCC)  -She returns with her glycemic profile showing tightened fasting blood glucose  readings, near target postprandial readings.  Her previsit A1c was 6.6%, generally improving from 9.1%.    -Her diabetes is complicated by stage 3 renal insufficiency, chronic heavy smoking, sedentary lifestyle and patient remains at extremely high risk for more acute and chronic complications of diabetes which include CAD, CVA, CKD, retinopathy, and neuropathy. These are all discussed in detail with the patient.  Recent labs reviewed and discussed with her.   - I have re-counseled the patient on diet management   by  adopting a carbohydrate restricted / protein rich  Diet.  - she  admits there is a room for improvement in her diet and drink choices. -  Suggestion is made for her to avoid simple carbohydrates  from her diet including Cakes, Sweet Desserts / Pastries, Ice Cream, Soda (diet and regular), Sweet Tea, Candies, Chips, Cookies, Sweet Pastries,  Store Bought Juices, Alcohol in Excess of  1-2 drinks a day, Artificial Sweeteners, Coffee Creamer, and "Sugar-free" Products. This will help patient to have stable blood glucose profile and potentially avoid unintended weight gain.    - Patient is advised to stick to a routine mealtimes to eat 3 meals  a day and avoid unnecessary snacks ( to snack only to correct hypoglycemia).   - I have approached patient with the following individualized plan to manage diabetes and patient agrees.  -She has achieved control of diabetes again.    -Due to tightening fasting glycemic profile, she is advised to lower her Tresiba to 60 units nightly, continue Humalog  10-16 units 3 times daily AC associated with monitoring of blood glucose strictly 4 times a day-daily before breakfast and at bedtime.  -She will benefit from a continuous glucose monitor.  I discussed and prescribed the freestyle libre device for her.  -Patient is encouraged to call clinic for blood glucose levels less than 70 or above 200 mg /dl. -She is tolerating metformin, advised to continue  metformin 500 mg p.o. twice daily- after breakfast and supper.       - Patient specific target  for A1c; LDL, HDL, Triglycerides, and  Waist Circumference were discussed in detail.  2) BP/HTN: Her blood pressure is controlled to target.  She is advised to continue her current blood pressure medications including lisinopril 10 mg p.o. daily, hydrochlorothiazide 25 mg p.o. daily.   3) Lipids/HPL: Recent lipid panel showed uncontrolled LDL at 111.  She is advised to continue atorvastatin 80 mg p.o. nightly,  and fenofibrate 160 mg p.o. Daily.  4)  Postsurgical hypothyroidism  -Her levothyroxine was adjusted recently to 150 mcg p.o. daily before breakfast.     - We discussed about the correct intake of her thyroid hormone, on empty stomach at fasting, with water, separated by at least 30 minutes from breakfast and other medications,  and separated by more than 4 hours from calcium, iron, multivitamins, acid reflux medications (PPIs). -Patient is made aware of the fact that thyroid hormone replacement is needed for life, dose to be adjusted by periodic monitoring of thyroid function tests.   5)  Weight/Diet:   declines to see the dietitian, exercise, and carbohydrates information provided.  6) Chronic Care/Health Maintenance:  -Patient is on ACEI/ARB and Statin medications and encouraged to continue to follow up with Ophthalmology, Podiatrist at least yearly or according to recommendations, and advised to quit smoking. I have recommended yearly flu vaccine and pneumonia vaccination at least every 5 years; moderate intensity exercise for up to 150 minutes weekly; and  sleep for at least 7 hours a day.  The patient was counseled on the dangers of tobacco use, and was advised to quit.  Reviewed strategies to maximize success, including removing cigarettes and smoking materials from environment.  - I advised patient to maintain close follow up with Jacqualine Mau, NP for primary care needs.  -  Time spent on this patient care encounter:  35 min, of which > 50% was spent in  counseling and the rest reviewing her blood glucose logs , discussing  her hypoglycemia and hyperglycemia episodes, reviewing her current and  previous labs / studies  ( including abstraction from other facilities) and medications  doses and developing a  long term treatment plan and documenting her care.   Please refer to Patient Instructions for Blood Glucose Monitoring and Insulin/Medications Dosing Guide"  in media tab for additional information. Please  also refer to " Patient Self Inventory" in the Media  tab for reviewed elements of pertinent patient history.  Darlisha Minor Vandeven participated in the discussions, expressed understanding, and voiced agreement with the above plans.  All questions were answered to her satisfaction. she is encouraged to contact clinic should she have any questions or concerns prior to her return visit.    Follow up plan: -Return in about 4 months (around 08/16/2019) for Follow up with Pre-visit Labs, Next Visit A1c in Office.  Marquis LunchGebre Ogle Hoeffner, MD Phone: [phone]  Fax: [phone]  -  This note was partially dictated with voice recognition software. Similar sounding words can be transcribed inadequately or may not  be corrected upon review.  04/18/2019, 11:39 AM

## 2019-05-02 ENCOUNTER — Encounter: Payer: Self-pay | Admitting: Gastroenterology

## 2019-06-06 ENCOUNTER — Other Ambulatory Visit: Payer: Self-pay | Admitting: "Endocrinology

## 2019-06-09 DIAGNOSIS — N1831 Chronic kidney disease, stage 3a: Secondary | ICD-10-CM | POA: Insufficient documentation

## 2019-06-17 ENCOUNTER — Emergency Department (HOSPITAL_COMMUNITY)
Admission: EM | Admit: 2019-06-17 | Discharge: 2019-06-17 | Disposition: A | Discharge status: Home Health | Payer: Medicare Other | Attending: Emergency Medicine | Admitting: Emergency Medicine

## 2019-06-17 ENCOUNTER — Other Ambulatory Visit: Payer: Self-pay

## 2019-06-17 ENCOUNTER — Emergency Department (HOSPITAL_COMMUNITY): Payer: Medicare Other

## 2019-06-17 ENCOUNTER — Encounter (HOSPITAL_COMMUNITY): Payer: Self-pay | Admitting: Emergency Medicine

## 2019-06-17 DIAGNOSIS — Y92018 Other place in single-family (private) house as the place of occurrence of the external cause: Secondary | ICD-10-CM | POA: Diagnosis not present

## 2019-06-17 DIAGNOSIS — Z79899 Other long term (current) drug therapy: Secondary | ICD-10-CM | POA: Insufficient documentation

## 2019-06-17 DIAGNOSIS — Y9301 Activity, walking, marching and hiking: Secondary | ICD-10-CM | POA: Insufficient documentation

## 2019-06-17 DIAGNOSIS — S82832A Other fracture of upper and lower end of left fibula, initial encounter for closed fracture: Secondary | ICD-10-CM | POA: Diagnosis not present

## 2019-06-17 DIAGNOSIS — W010XXA Fall on same level from slipping, tripping and stumbling without subsequent striking against object, initial encounter: Secondary | ICD-10-CM | POA: Diagnosis not present

## 2019-06-17 DIAGNOSIS — E039 Hypothyroidism, unspecified: Secondary | ICD-10-CM | POA: Diagnosis not present

## 2019-06-17 DIAGNOSIS — Z794 Long term (current) use of insulin: Secondary | ICD-10-CM | POA: Insufficient documentation

## 2019-06-17 DIAGNOSIS — M25552 Pain in left hip: Secondary | ICD-10-CM | POA: Insufficient documentation

## 2019-06-17 DIAGNOSIS — Y998 Other external cause status: Secondary | ICD-10-CM | POA: Diagnosis not present

## 2019-06-17 DIAGNOSIS — N1831 Chronic kidney disease, stage 3a: Secondary | ICD-10-CM | POA: Diagnosis not present

## 2019-06-17 DIAGNOSIS — I129 Hypertensive chronic kidney disease with stage 1 through stage 4 chronic kidney disease, or unspecified chronic kidney disease: Secondary | ICD-10-CM | POA: Insufficient documentation

## 2019-06-17 DIAGNOSIS — W19XXXA Unspecified fall, initial encounter: Secondary | ICD-10-CM

## 2019-06-17 DIAGNOSIS — F17210 Nicotine dependence, cigarettes, uncomplicated: Secondary | ICD-10-CM | POA: Diagnosis not present

## 2019-06-17 DIAGNOSIS — E1122 Type 2 diabetes mellitus with diabetic chronic kidney disease: Secondary | ICD-10-CM | POA: Diagnosis not present

## 2019-06-17 DIAGNOSIS — S8992XA Unspecified injury of left lower leg, initial encounter: Secondary | ICD-10-CM | POA: Diagnosis present

## 2019-06-17 MED ORDER — FENTANYL CITRATE (PF) 100 MCG/2ML IJ SOLN
50.0000 ug | Freq: Once | INTRAMUSCULAR | Status: AC
  Administered 2019-06-17: 50 ug via INTRAMUSCULAR
  Filled 2019-06-17: qty 2

## 2019-06-17 MED ORDER — OXYCODONE-ACETAMINOPHEN 5-325 MG PO TABS: 1.0000 | ORAL_TABLET | Freq: Four times a day (QID) | ORAL | 0 refills | Status: AC | PRN

## 2019-06-17 NOTE — ED Triage Notes (Signed)
Pt c/o falling in the bathroom this AM, stating her left knee "went all the way behind her back."

## 2019-06-17 NOTE — ED Provider Notes (Signed)
[facility] EMERGENCY DEPARTMENT Provider Note   CSN: [id number] Arrival date & time: 06/17/19  0449   Time seen 5:00 AM  History Chief Complaint  Patient presents with  . Knee Pain    Linda Riggs is a 64 y.o. female.  HPI   Patient states she got up to go to the bathroom and as she walked in she slipped and her right leg went forward and her left leg went behind her.  Her husband states he heard her fall and when he went in she was laying on her back with both legs straight out in front of her.  She denies hitting her head or having any neck pain.  She complains of pain in her left knee and left hip.  She denies any numbness in her feet.  She has osteoarthritis in her knees and has had arthroscopic surgery in both knees and is getting joint injections by her primary care doctor.   PCP Jacqualine Mau, NP   Past Medical History:  Diagnosis Date  . Arthritis   . Chronic headaches   . Depression   . Diverticulosis   . Fibromyalgia   . GERD (gastroesophageal reflux disease)   . Goiter   . Hypertension   . Hypothyroidism   . Internal hemorrhoids   . Iron deficiency anemia    Dr. Arlyce Dice 11/10  . Osteoporosis   . Ruptured lumbar disc   . Type 2 diabetes mellitus [facility])     Patient Active Problem List   Diagnosis Date Noted  . Hypothyroidism 12/14/2018  . Heme positive stool   . Bronchitis with bronchospasm 05/24/2018  . Calculus of gallbladder without cholecystitis without obstruction   . RUQ pain 10/31/2016  . 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 Hyperlipidemia 03/07/2015  . Essential hypertension, benign 03/07/2015  . History of colonic polyps 09/20/2014  . Postoperative wound hematoma 06/08/2013  . Atypical chest pain 09/06/2010  . Tobacco abuse 09/06/2010  . Mixed hyperlipidemia 09/06/2010  . Type 2 diabetes mellitus with stage 3a chronic kidney disease, with long-term current use of insulin (HCC) 01/31/2009  . Iron deficiency anemia 01/31/2009  .  Esophageal reflux 01/31/2009  . Dysphagia 01/31/2009    Past Surgical History:  Procedure Laterality Date  . Arthroscopic left knee surgery Bilateral   . BACK SURGERY    . CHOLECYSTECTOMY N/A 11/21/2016   Procedure: LAPAROSCOPIC CHOLECYSTECTOMY;  Surgeon: Franky Macho, MD;  Location: [facility] ORS;  Service: General;  Laterality: N/A;  . COLONOSCOPY  2016   Dr. Arlyce Dice: diverticulosis  . DILATION AND CURETTAGE OF UTERUS    . ESOPHAGOGASTRODUODENOSCOPY  2016   Dr. Arlyce Dice: early GE junction stricture s/p dilation   . ESOPHAGOGASTRODUODENOSCOPY (EGD) WITH PROPOFOL N/A 11/30/2018   Procedure: ESOPHAGOGASTRODUODENOSCOPY (EGD) WITH PROPOFOL;  Surgeon: West Bali, MD;  Location: [facility] ENDO SUITE;  Service: Endoscopy;  Laterality: N/A;  9:30am  . SAVORY DILATION N/A 11/30/2018   Procedure: SAVORY DILATION;  Surgeon: West Bali, MD;  Location: [facility] ENDO SUITE;  Service: Endoscopy;  Laterality: N/A;  . THYROIDECTOMY N/A 06/06/2013   Procedure: TOTAL THYROIDECTOMY;  Surgeon: Dalia Heading, MD;  Location: [facility] ORS;  Service: General;  Laterality: N/A;     OB History    Gravida  4   Para  2   Term  2   Preterm      AB  2   Living        SAB  2   TAB  Ectopic      Multiple      Live Births              Family History  Problem Relation Age of Onset  . Diabetes type II Mother   . Alzheimer's disease Mother   . Diabetes type II Sister   . Diabetes Sister   . Diabetes type II Father   . Cancer Father        Lung  . Diabetes Brother   . Diabetes Sister   . Colon cancer Neg Hx     Social History   Tobacco Use  . Smoking status: Current Every Day Smoker    Packs/day: 0.25    Years: 20.00    Pack years: 5.00    Types: Cigarettes  . Smokeless tobacco: Never Used  Substance Use Topics  . Alcohol use: No    Alcohol/week: 0.0 standard drinks  . Drug use: No  lives at home Lives with spouse  Home Medications Prior to Admission medications   Medication Sig Start Date  End Date Taking? Authorizing Provider  amitriptyline (ELAVIL) 25 MG tablet Take 50 mg by mouth at bedtime.  07/21/18   [provider]  amLODipine (NORVASC) 10 MG tablet Take 10 mg by mouth daily. 08/24/18   [provider]  Artificial Tear Ointment (DRY EYES OP) Place 1 drop into both eyes 3 (three) times daily as needed (dry/irritated eyes.).     [provider]  atorvastatin (LIPITOR) 80 MG tablet Take 80 mg by mouth daily.    [provider]  buPROPion (WELLBUTRIN SR) 150 MG 12 hr tablet Take 150 mg by mouth 2 (two) times daily. 10/27/18   [provider]  calcium carbonate (OSCAL) 1500 (600 Ca) MG TABS tablet Take 600 mg by mouth 2 (two) times daily.    [provider]  Continuous Blood Gluc Sensor (FREESTYLE LIBRE 14 DAY SENSOR) MISC Inject 1 each into the skin every 14 (fourteen) days. Use as directed. 04/18/19   Roma KayserNida, Gebreselassie W, MD  esomeprazole (NEXIUM) 40 MG capsule Take 1 capsule (40 mg total) by mouth 2 (two) times daily before a meal. Patient taking differently: Take 40 mg by mouth daily.  05/25/18   Vassie LollMadera, Carlos, MD  fexofenadine (ALLEGRA) 180 MG tablet Take 180 mg by mouth daily.      [provider]  gabapentin (NEURONTIN) 300 MG capsule Take 300 mg by mouth 2 (two) times daily. 09/22/18   [provider]  insulin degludec (TRESIBA FLEXTOUCH) 100 UNIT/ML FlexTouch Pen Inject 0.6 mLs (60 Units total) into the skin at bedtime. 06/07/19   Roma KayserNida, Gebreselassie W, MD  insulin lispro (HUMALOG KWIKPEN) 100 UNIT/ML KwikPen Inject 0.1-0.16 mLs (10-16 Units total) into the skin 3 (three) times daily. 12/14/18   Roma KayserNida, Gebreselassie W, MD  levothyroxine (SYNTHROID) 150 MCG tablet TAKE ONE TABLET EVERY MORNING 03/04/19   Roma KayserNida, Gebreselassie W, MD  lisinopril-hydrochlorothiazide (ZESTORETIC) 20-25 MG tablet Take 2 tablets by mouth daily. 09/22/18   [provider]  metFORMIN (GLUCOPHAGE-XR) 500 MG 24 hr tablet TAKE ONE  TABLET BY MOUTH TWICE DAILY 04/04/19   Nida, Denman GeorgeGebreselassie W, MD  [facility] LANCETS 33G MISC 4 TIMES DAILY 01/23/17   Roma KayserNida, Gebreselassie W, MD  [facility] VERIO test strip 4 TIMES DAILY 12/14/18   Roma KayserNida, Gebreselassie W, MD  oxybutynin (DITROPAN-XL) 5 MG 24 hr tablet Take 5 mg by mouth at bedtime. 10/27/18   [provider]  oxyCODONE-acetaminophen (PERCOCET/ROXICET) 5-325 MG  tablet Take 1 tablet by mouth every 6 (six) hours as needed for moderate pain or severe pain. 06/17/19   Devoria Albe, MD  Potassium 99 MG TABS Take 99 mg by mouth 2 (two) times daily.    [provider]  rizatriptan (MAXALT-MLT) 10 MG disintegrating tablet Take 10 mg by mouth every 2 (two) hours as needed for migraine. 09/09/18   [provider]  rOPINIRole (REQUIP) 2 MG tablet Take 4 mg by mouth at bedtime.  01/06/17   [provider]  simvastatin (ZOCOR) 40 MG tablet Take 40 mg by mouth every evening.  09/22/18   [provider]  insulin aspart (NOVOLOG FLEXPEN) 100 UNIT/ML FlexPen Inject 10-16 Units into the skin 3 (three) times daily before meals. 09/09/18 09/09/18  Roma Kayser, MD    Allergies    Aspirin, Flonase [fluticasone propionate], Prednisone, and Tramadol  Review of Systems   Review of Systems  All other systems reviewed and are negative.   Physical Exam Updated Vital Signs BP (!) 158/60   Pulse 81   Temp 98 F (36.7 C)   Resp 16   Ht 5\' 2"  (1.575 m)   Wt 86.2 kg   SpO2 98%   BMI 34.75 kg/m   Physical Exam Vitals and nursing note reviewed.  Constitutional:      General: She is in acute distress.     Appearance: Normal appearance.  HENT:     Head: Normocephalic and atraumatic.  Eyes:     Extraocular Movements: Extraocular movements intact.     Conjunctiva/sclera: Conjunctivae normal.     Pupils: Pupils are equal, round, and reactive to light.  Cardiovascular:     Rate and Rhythm: Normal rate.  Pulmonary:     Effort: Pulmonary effort is  normal. No respiratory distress.  Musculoskeletal:        General: No swelling or deformity.     Cervical back: Normal range of motion.     Comments: Patient's right lower extremity is nontender from hip to toes.  There is no swelling noted.  She has some mild tenderness over the true left hip joint and also over the lateral proximal hip over the greater trochanter.  There is no obvious swelling seen.  When I look at her knee there is no bruising or swelling, there is no joint effusion present.  However patient almost starts screaming when I palpate her left lower leg, at her mid shin she states it is very painful.  There is no swelling or crepitance or abrasion seen.  Her ankle and foot are nontender.  She has good  distal pulses.  Skin:    General: Skin is warm and dry.     Findings: No bruising or erythema.  Neurological:     General: No focal deficit present.     Mental Status: She is alert and oriented to person, place, and time.     Cranial Nerves: No cranial nerve deficit.  Psychiatric:        Mood and Affect: Mood is anxious.        Speech: Speech is rapid and pressured.        Behavior: Behavior is agitated.        Thought Content: Thought content normal.     ED Results / Procedures / Treatments   Labs (all labs ordered are listed, but only abnormal results are displayed) Labs Reviewed - No data to display  EKG None  Radiology DG Tibia/Fibula Left  Result  Date: 06/17/2019 CLINICAL DATA:  Fall with left leg pain EXAM: LEFT TIBIA AND FIBULA - 2 VIEW COMPARISON:  None. FINDINGS: Nondisplaced fibular neck fracture. Degenerative spurring at the medial and lateral compartments of the knee. A calcified articular body is present at the posteromedial knee joint. No widening of the medial clear space. There is a sclerotic line 5 mm medial talar dome osteochondral lesion. IMPRESSION: 1. Nondisplaced fibular neck fracture. 2. Osteochondral lesion at the medial talar dome. 3. Knee  osteoarthritis with posterior articular body. Electronically Signed   By: Monte Fantasia M.D.   On: 06/17/2019 06:06   DG Knee Complete 4 Views Left  Result Date: 06/17/2019 CLINICAL DATA:  Fall tonight with leg pain EXAM: LEFT KNEE - COMPLETE 4+ VIEW COMPARISON:  None. FINDINGS: Nondisplaced fibular neck fracture which is branching. Negative for knee joint effusion. There is generalized marginal degenerative spurring at the knee joint. Calcified medial articular body is present and measures 11 mm IMPRESSION: 1. Nondisplaced fibular neck fracture. 2. Knee osteoarthritis with posterior and medial calcified articular body. Electronically Signed   By: Monte Fantasia M.D.   On: 06/17/2019 06:04   DG Hip Unilat W or Wo Pelvis 2-3 Views Left  Result Date: 06/17/2019 CLINICAL DATA:  Fall tonight with leg pain EXAM: DG HIP (WITH OR WITHOUT PELVIS) 2-3V LEFT COMPARISON:  None. FINDINGS: There is no evidence of hip fracture or dislocation. There is no evidence of arthropathy or other focal bone abnormality. IMPRESSION: Negative. Electronically Signed   By: Monte Fantasia M.D.   On: 06/17/2019 06:04    Procedures Procedures (including critical care time)  Medications Ordered in ED Medications  fentaNYL (SUBLIMAZE) injection 50 mcg (50 mcg Intramuscular Given 06/17/19 0531)    ED Course  I have reviewed the triage vital signs and the nursing notes.  Pertinent labs & imaging results that were available during my care of the patient were reviewed by me and considered in my medical decision making (see chart for details).    MDM Rules/Calculators/A&P                      Patient was given fentanyl IM for pain, x-rays were done of the painful places.  6:15 AM I discussed her test results and gave her copies of her x-ray.  She denies having episodes where her knee locks up.  I showed her the articular body and advised her that if she ever gets to the point where her knee locks up she should discuss it  with the orthopedist, she might need arthroscopy to remove the articular body.  I reexamined her left lower leg she still has good pulses in her foot.  She has dorsiflexion intact of her foot.  Patient was placed in a long-leg splint and crutches.  She was referred to local orthopedics.  Patient had arthroscopy done before but it was done by an orthopedist in [place] who is no longer there.  We discussed the mechanism of injury again.  She states her right leg went forward and her right leg was flexed at the knee with her foot around her buttocks.  She states she had immediate pain, she denies feeling that her knee was slipping or sliding or trying to pop out of joint.  We discussed if it did we would need to do an arteriogram to check for arterial injury.  However she states that that did not happen.    Final Clinical Impression(s) / ED Diagnoses Final  diagnoses:  Fall in home, initial encounter  Closed fracture of neck of left fibula, initial encounter    Rx / DC Orders ED Discharge Orders         Ordered    oxyCODONE-acetaminophen (PERCOCET/ROXICET) 5-325 MG tablet  Every 6 hours PRN     06/17/19 0629         Plan discharge  Devoria Albe, MD, Concha Pyo, MD 06/17/19 [phone]

## 2019-06-17 NOTE — Discharge Instructions (Addendum)
Elevate your leg, you can use ice packs to keep the swelling down and for comfort.  Take the medication as prescribed.  Please call Dr. Mort Sawyers office, the orthopedist on-call to get a follow-up appointment next week.  Use the crutches and leave the splint in place until you are seen by Dr. Romeo Apple.  Return to the emergency department if you get pain or numbness in your toes, your toes get cold or if you have problems with your splint.

## 2019-06-20 ENCOUNTER — Encounter: Payer: Self-pay | Admitting: Orthopedic Surgery

## 2019-06-20 ENCOUNTER — Other Ambulatory Visit: Payer: Self-pay

## 2019-06-20 ENCOUNTER — Ambulatory Visit: Payer: Medicare Other | Admitting: Orthopedic Surgery

## 2019-06-20 VITALS — BP 114/72 | HR 87 | Ht 62.0 in | Wt 190.0 lb

## 2019-06-20 DIAGNOSIS — E1165 Type 2 diabetes mellitus with hyperglycemia: Secondary | ICD-10-CM | POA: Insufficient documentation

## 2019-06-20 DIAGNOSIS — S82832A Other fracture of upper and lower end of left fibula, initial encounter for closed fracture: Secondary | ICD-10-CM

## 2019-06-20 NOTE — Progress Notes (Signed)
Linda Riggs  06/20/2019  Body mass index is 34.75 kg/m.   HISTORY SECTION :  Chief Complaint  Patient presents with  . Knee Pain    left knee 06/15/19 injury    HPI The patient presents for evaluation of  (mild/moderate/severe/ ) moderate to severe pain, in the (right /left) left knee and leg, for 5 days, associated with pain and swelling around the knee joint when lateral left leg.  Prior treatment posterior splint   Review of Systems  All other systems reviewed and are negative.    has a past medical history of Arthritis, Chronic headaches, Depression, Diverticulosis, Fibromyalgia, GERD (gastroesophageal reflux disease), Goiter, Hypertension, Hypothyroidism, Internal hemorrhoids, Iron deficiency anemia, Osteoporosis, Ruptured lumbar disc, and Type 2 diabetes mellitus ([place]).   Past Surgical History:  Procedure Laterality Date  . Arthroscopic left knee surgery Bilateral   . BACK SURGERY    . CHOLECYSTECTOMY N/A 11/21/2016   Procedure: LAPAROSCOPIC CHOLECYSTECTOMY;  Surgeon: Aviva Signs, MD;  Location: [facility] ORS;  Service: General;  Laterality: N/A;  . COLONOSCOPY  2016   Dr. Deatra Ina: diverticulosis  . DILATION AND CURETTAGE OF UTERUS    . ESOPHAGOGASTRODUODENOSCOPY  2016   Dr. Deatra Ina: early GE junction stricture s/p dilation   . ESOPHAGOGASTRODUODENOSCOPY (EGD) WITH PROPOFOL N/A 11/30/2018   Procedure: ESOPHAGOGASTRODUODENOSCOPY (EGD) WITH PROPOFOL;  Surgeon: Danie Binder, MD;  Location: [facility];  Service: Endoscopy;  Laterality: N/A;  9:30am  . SAVORY DILATION N/A 11/30/2018   Procedure: SAVORY DILATION;  Surgeon: Danie Binder, MD;  Location: [facility];  Service: Endoscopy;  Laterality: N/A;  . THYROIDECTOMY N/A 06/06/2013   Procedure: TOTAL THYROIDECTOMY;  Surgeon: Jamesetta So, MD;  Location: [facility] ORS;  Service: General;  Laterality: N/A;    Body mass index is 34.75 kg/m.   Allergies  Allergen Reactions  . Aspirin Itching  . Flonase [Fluticasone  Propionate] Itching  . Prednisone Itching  . Tramadol Itching     Current Outpatient Medications:  .  amitriptyline (ELAVIL) 25 MG tablet, Take 50 mg by mouth at bedtime. , Disp: , Rfl:  .  amLODipine (NORVASC) 10 MG tablet, Take 10 mg by mouth daily., Disp: , Rfl:  .  Artificial Tear Ointment (DRY EYES OP), Place 1 drop into both eyes 3 (three) times daily as needed (dry/irritated eyes.). , Disp: , Rfl:  .  atorvastatin (LIPITOR) 80 MG tablet, Take 80 mg by mouth daily., Disp: , Rfl:  .  buPROPion (WELLBUTRIN SR) 150 MG 12 hr tablet, Take 150 mg by mouth 2 (two) times daily., Disp: , Rfl:  .  calcium carbonate (OSCAL) 1500 (600 Ca) MG TABS tablet, Take 600 mg by mouth 2 (two) times daily., Disp: , Rfl:  .  Continuous Blood Gluc Sensor (FREESTYLE LIBRE 14 DAY SENSOR) MISC, Inject 1 each into the skin every 14 (fourteen) days. Use as directed., Disp: 2 each, Rfl: 2 .  esomeprazole (NEXIUM) 40 MG capsule, Take 1 capsule (40 mg total) by mouth 2 (two) times daily before a meal. (Patient taking differently: Take 40 mg by mouth daily. ), Disp: 60 capsule, Rfl: 1 .  fexofenadine (ALLEGRA) 180 MG tablet, Take 180 mg by mouth daily.  , Disp: , Rfl:  .  gabapentin (NEURONTIN) 300 MG capsule, Take 300 mg by mouth 2 (two) times daily., Disp: , Rfl:  .  insulin degludec (TRESIBA FLEXTOUCH) 100 UNIT/ML FlexTouch Pen, Inject 0.6 mLs (60 Units total) into the skin at bedtime., Disp: 30  mL, Rfl: 0 .  insulin lispro (HUMALOG KWIKPEN) 100 UNIT/ML KwikPen, Inject 0.1-0.16 mLs (10-16 Units total) into the skin 3 (three) times daily., Disp: 15 mL, Rfl: 2 .  levothyroxine (SYNTHROID) 150 MCG tablet, TAKE ONE TABLET EVERY MORNING, Disp: 90 tablet, Rfl: 1 .  lisinopril-hydrochlorothiazide (ZESTORETIC) 20-25 MG tablet, Take 2 tablets by mouth daily., Disp: , Rfl:  .  metFORMIN (GLUCOPHAGE-XR) 500 MG 24 hr tablet, TAKE ONE TABLET BY MOUTH TWICE DAILY, Disp: 180 tablet, Rfl: 0 .  ONETOUCH DELICA LANCETS 33G MISC, 4 TIMES  DAILY, Disp: 100 each, Rfl: 5 .  ONETOUCH VERIO test strip, 4 TIMES DAILY, Disp: 150 each, Rfl: 5 .  oxybutynin (DITROPAN-XL) 5 MG 24 hr tablet, Take 5 mg by mouth at bedtime., Disp: , Rfl:  .  oxyCODONE-acetaminophen (PERCOCET/ROXICET) 5-325 MG tablet, Take 1 tablet by mouth every 6 (six) hours as needed for moderate pain or severe pain., Disp: 20 tablet, Rfl: 0 .  Potassium 99 MG TABS, Take 99 mg by mouth 2 (two) times daily., Disp: , Rfl:  .  rizatriptan (MAXALT-MLT) 10 MG disintegrating tablet, Take 10 mg by mouth every 2 (two) hours as needed for migraine., Disp: , Rfl:  .  rOPINIRole (REQUIP) 2 MG tablet, Take 4 mg by mouth at bedtime. , Disp: , Rfl:  .  simvastatin (ZOCOR) 40 MG tablet, Take 40 mg by mouth every evening. , Disp: , Rfl:    PHYSICAL EXAM SECTION: 1) BP 114/72   Pulse 87   Ht 5\' 2"  (1.575 m)   Wt 190 lb (86.2 kg)   BMI 34.75 kg/m   Body mass index is 34.75 kg/m. General appearance: Well-developed well-nourished no gross deformities  2) Cardiovascular normal pulse and perfusion , normal color   3) Neurologically deep tendon reflexes are equal and normal, no sensation loss or deficits no pathologic reflexes  4) Psychological: Awake alert and oriented x3 mood and affect normal  5) Skin no lacerations or ulcerations no nodularity no palpable masses, no erythema or nodularity  6) Musculoskeletal:   Gait is requiring a walker and wheelchair  Left knee has a bruise anteromedially no tenderness medially no joint effusion tenderness is over the fibular head and lateral portion of the leg foot is normal.  Muscle tone is excellent.  No tremors are noted.  No atrophy.  Skin abrasion over the left knee unrelated to the fracture   MEDICAL DECISION MAKING  A.  Encounter Diagnosis  Name Primary?  . Closed fracture fibula, head, left, initial encounter Yes    B. DATA ANALYSED:  IMAGING: Independent interpretation of images: Mild arthritis in the left knee joint with  a proximal fibular fracture on the tib-fib x-ray knee films show degenerative changes in the knee joint as well  Orders: No  Outside records reviewed: Yes ER records were reviewed patient is noted to have fallen while going to the bathroom tripping over her foot  C. MANAGEMENT  Recommend hinged knee brace active range of motion and full weightbearing with a walker for 6 weeks  X-ray 6 weeks  No orders of the defined types were placed in this encounter.     , MD  06/20/2019 8:53 AM

## 2019-06-23 ENCOUNTER — Other Ambulatory Visit: Payer: Self-pay | Admitting: Orthopedic Surgery

## 2019-06-23 ENCOUNTER — Other Ambulatory Visit: Payer: Self-pay | Admitting: "Endocrinology

## 2019-06-23 DIAGNOSIS — S82832A Other fracture of upper and lower end of left fibula, initial encounter for closed fracture: Secondary | ICD-10-CM

## 2019-06-23 MED ORDER — HYDROCODONE-ACETAMINOPHEN 5-325 MG PO TABS: 1.0000 | ORAL_TABLET | Freq: Four times a day (QID) | ORAL | 0 refills | Status: DC | PRN

## 2019-06-23 NOTE — Telephone Encounter (Signed)
Patient called to ask for refill on pain medication; states has run out of medication from Emergency room provider; has been seen by Dr Romeo Apple on 06/20/19 - please advise: oxyCODONE-acetaminophen (PERCOCET/ROXICET) 5-325 MG tablet 20 tablet  Pharmacy: [facility], [address]

## 2019-06-23 NOTE — Progress Notes (Signed)
Percocet change to norco

## 2019-07-04 ENCOUNTER — Other Ambulatory Visit: Payer: Self-pay | Admitting: Orthopedic Surgery

## 2019-07-04 DIAGNOSIS — S82832A Other fracture of upper and lower end of left fibula, initial encounter for closed fracture: Secondary | ICD-10-CM

## 2019-07-08 ENCOUNTER — Other Ambulatory Visit: Payer: Self-pay | Admitting: "Endocrinology

## 2019-07-22 ENCOUNTER — Other Ambulatory Visit: Payer: Self-pay | Admitting: "Endocrinology

## 2019-07-25 DIAGNOSIS — S82832D Other fracture of upper and lower end of left fibula, subsequent encounter for closed fracture with routine healing: Secondary | ICD-10-CM | POA: Insufficient documentation

## 2019-08-01 ENCOUNTER — Ambulatory Visit (INDEPENDENT_AMBULATORY_CARE_PROVIDER_SITE_OTHER): Payer: Medicare Other | Admitting: Orthopedic Surgery

## 2019-08-01 ENCOUNTER — Other Ambulatory Visit: Payer: Self-pay

## 2019-08-01 ENCOUNTER — Ambulatory Visit: Payer: Medicare Other

## 2019-08-01 VITALS — Temp 97.1°F | Ht 62.0 in | Wt 190.0 lb

## 2019-08-01 DIAGNOSIS — S82832D Other fracture of upper and lower end of left fibula, subsequent encounter for closed fracture with routine healing: Secondary | ICD-10-CM

## 2019-08-01 NOTE — Progress Notes (Signed)
Fracture care follow-up left fibular fracture  This patient sustained a proximal left fibular fracture on March 17 she is approximately 6 weeks from injury says her fracture does not hurt anymore she is walking without any support her x-ray looks good her exam shows no tenderness  She was released

## 2019-08-10 ENCOUNTER — Telehealth: Payer: Self-pay | Admitting: "Endocrinology

## 2019-08-10 LAB — COMPLETE METABOLIC PANEL WITH GFR
AG Ratio: 1.5 (calc) (ref 1.0–2.5)
ALT: 28 U/L (ref 6–29)
AST: 22 U/L (ref 10–35)
Albumin: 3.7 g/dL (ref 3.6–5.1)
Alkaline phosphatase (APISO): 60 U/L (ref 37–153)
BUN/Creatinine Ratio: 20 (calc) (ref 6–22)
BUN: 24 mg/dL (ref 7–25)
CO2: 21 mmol/L (ref 20–32)
Calcium: 9 mg/dL (ref 8.6–10.4)
Chloride: 103 mmol/L (ref 98–110)
Creat: 1.2 mg/dL — ABNORMAL HIGH (ref 0.50–0.99)
GFR, Est African American: 55 mL/min/{1.73_m2} — ABNORMAL LOW (ref >=60)
GFR, Est Non African American: 48 mL/min/{1.73_m2} — ABNORMAL LOW (ref >=60)
Globulin: 2.4 g/dL (calc) (ref 1.9–3.7)
Glucose, Bld: 122 mg/dL — ABNORMAL HIGH (ref 65–99)
Potassium: 5.1 mmol/L (ref 3.5–5.3)
Sodium: 135 mmol/L (ref 135–146)
Total Bilirubin: 0.3 mg/dL (ref 0.2–1.2)
Total Protein: 6.1 g/dL (ref 6.1–8.1)

## 2019-08-10 LAB — LIPID PANEL
Cholesterol: 183 mg/dL (ref <200)
HDL: 53 mg/dL (ref >=50)
LDL Cholesterol (Calc): 92 mg/dL (calc)
Non-HDL Cholesterol (Calc): 130 mg/dL (calc) — ABNORMAL HIGH (ref <130)
Total CHOL/HDL Ratio: 3.5 (calc) (ref <5.0)
Triglycerides: 263 mg/dL — ABNORMAL HIGH (ref <150)

## 2019-08-10 LAB — MICROALBUMIN / CREATININE URINE RATIO
Creatinine, Urine: 65 mg/dL (ref 20–275)
Microalb Creat Ratio: 445 mcg/mg creat — ABNORMAL HIGH (ref <30)
Microalb, Ur: 28.9 mg/dL

## 2019-08-10 LAB — VITAMIN D 25 HYDROXY (VIT D DEFICIENCY, FRACTURES): Vit D, 25-Hydroxy: 26 ng/mL — ABNORMAL LOW (ref 30–100)

## 2019-08-10 LAB — TSH: TSH: 0.02 mIU/L — ABNORMAL LOW (ref 0.40–4.50)

## 2019-08-10 LAB — T4, FREE: Free T4: 1.8 ng/dL (ref 0.8–1.8)

## 2019-08-10 NOTE — Telephone Encounter (Signed)
Linda Riggs with [employer], he stated pt's last visit was to long ago to use for insurance. I advised him pt has an upcoming appt and I will fax documentation after her visit. Understanding voiced.

## 2019-08-10 NOTE — Telephone Encounter (Signed)
Tyler with [employer] left a VM that they received logs on this patients from February and it says on there she is testing 3-4 times a day. Per insurance, they can not use that. It has to say she test " 4x a day ". He is asking for most recent OV notes and logs with something saying she test 4x a day. That can be faxed to them or he can be reached at [phone]

## 2019-08-10 NOTE — Telephone Encounter (Signed)
Left a message requesting a return call to the office. 

## 2019-08-17 ENCOUNTER — Encounter: Payer: Self-pay | Admitting: "Endocrinology

## 2019-08-17 ENCOUNTER — Other Ambulatory Visit: Payer: Self-pay

## 2019-08-17 ENCOUNTER — Ambulatory Visit (INDEPENDENT_AMBULATORY_CARE_PROVIDER_SITE_OTHER): Payer: Medicare Other | Admitting: "Endocrinology

## 2019-08-17 VITALS — BP 114/71 | HR 82 | Ht 62.0 in | Wt 190.8 lb

## 2019-08-17 DIAGNOSIS — Z794 Long term (current) use of insulin: Secondary | ICD-10-CM | POA: Diagnosis not present

## 2019-08-17 DIAGNOSIS — I10 Essential (primary) hypertension: Secondary | ICD-10-CM

## 2019-08-17 DIAGNOSIS — E1121 Type 2 diabetes mellitus with diabetic nephropathy: Secondary | ICD-10-CM

## 2019-08-17 DIAGNOSIS — E782 Mixed hyperlipidemia: Secondary | ICD-10-CM | POA: Diagnosis not present

## 2019-08-17 DIAGNOSIS — N1831 Chronic kidney disease, stage 3a: Secondary | ICD-10-CM | POA: Diagnosis not present

## 2019-08-17 DIAGNOSIS — E1122 Type 2 diabetes mellitus with diabetic chronic kidney disease: Secondary | ICD-10-CM

## 2019-08-17 DIAGNOSIS — E890 Postprocedural hypothyroidism: Secondary | ICD-10-CM | POA: Diagnosis not present

## 2019-08-17 LAB — POCT GLYCOSYLATED HEMOGLOBIN (HGB A1C): Hemoglobin A1C: 6.7 % — AB (ref 4.0–5.6)

## 2019-08-17 MED ORDER — LEVOTHYROXINE SODIUM 137 MCG PO TABS: 137.0000 ug | ORAL_TABLET | Freq: Every day | ORAL | 3 refills | Status: AC

## 2019-08-17 MED ORDER — VITAMIN D3 125 MCG (5000 UT) PO CAPS: 5000.0000 [IU] | ORAL_CAPSULE | Freq: Every day | ORAL | 0 refills | Status: AC

## 2019-08-17 NOTE — Patient Instructions (Signed)

## 2019-08-17 NOTE — Progress Notes (Signed)
08/17/2019   Endocrinology follow-up note   Subjective:    Patient ID: Linda Riggs, female    DOB: May 20, 1955, PCP Jacqualine Mau, NP   Past Medical History:  Diagnosis Date  . Arthritis   . Chronic headaches   . Depression   . Diverticulosis   . Fibromyalgia   . GERD (gastroesophageal reflux disease)   . Goiter   . Hypertension   . Hypothyroidism   . Internal hemorrhoids   . Iron deficiency anemia    Dr. Arlyce Dice 11/10  . Osteoporosis   . Ruptured lumbar disc   . Type 2 diabetes mellitus (HCC)    Past Surgical History:  Procedure Laterality Date  . Arthroscopic left knee surgery Bilateral   . BACK SURGERY    . CHOLECYSTECTOMY N/A 11/21/2016   Procedure: LAPAROSCOPIC CHOLECYSTECTOMY;  Surgeon: Franky Macho, MD;  Location: [facility];  Service: General;  Laterality: N/A;  . COLONOSCOPY  2016   Dr. Arlyce Dice: diverticulosis  . DILATION AND CURETTAGE OF UTERUS    . ESOPHAGOGASTRODUODENOSCOPY  2016   Dr. Arlyce Dice: early GE junction stricture s/p dilation   . ESOPHAGOGASTRODUODENOSCOPY (EGD) WITH PROPOFOL N/A 11/30/2018   Procedure: ESOPHAGOGASTRODUODENOSCOPY (EGD) WITH PROPOFOL;  Surgeon: West Bali, MD;  Location: [facility];  Service: Endoscopy;  Laterality: N/A;  9:30am  . SAVORY DILATION N/A 11/30/2018   Procedure: SAVORY DILATION;  Surgeon: West Bali, MD;  Location: [facility];  Service: Endoscopy;  Laterality: N/A;  . THYROIDECTOMY N/A 06/06/2013   Procedure: TOTAL THYROIDECTOMY;  Surgeon: Dalia Heading, MD;  Location: [facility];  Service: General;  Laterality: N/A;   Social History   Socioeconomic History  . Marital status: Single    Spouse name: Not on file  . Number of children: Not on file  . Years of education: Not on file  . Highest education level: Not on file  Occupational History  . Not on file  Tobacco Use  . Smoking status: Current Every Day Smoker    Packs/day: 0.25    Years: 20.00    Pack years: 5.00    Types: Cigarettes  .  Smokeless tobacco: Never Used  Substance and Sexual Activity  . Alcohol use: No    Alcohol/week: 0.0 standard drinks  . Drug use: No  . Sexual activity: Yes    Birth control/protection: Post-menopausal  Other Topics Concern  . Not on file  Social History Narrative  . Not on file   Social Determinants of Health   Financial Resource Strain:   . Difficulty of Paying Living Expenses:   Food Insecurity:   . Worried About Programme researcher, broadcasting/film/video in the Last Year:   . Barista in the Last Year:   Transportation Needs:   . Freight forwarder (Medical):   Marland Kitchen Lack of Transportation (Non-Medical):   Physical Activity:   . Days of Exercise per Week:   . Minutes of Exercise per Session:   Stress:   . Feeling of Stress :   Social Connections:   . Frequency of Communication with Friends and Family:   . Frequency of Social Gatherings with Friends and Family:   . Attends Religious Services:   . Active Member of Clubs or Organizations:   . Attends Banker Meetings:   Marland Kitchen Marital Status:    Outpatient Encounter Medications as of 08/17/2019  Medication Sig  . amitriptyline (ELAVIL) 25 MG tablet Take 50 mg by mouth at bedtime.   Marland Kitchen  amLODipine (NORVASC) 10 MG tablet Take 10 mg by mouth daily.  . Artificial Tear Ointment (DRY EYES OP) Place 1 drop into both eyes 3 (three) times daily as needed (dry/irritated eyes.).   Marland Kitchen atorvastatin (LIPITOR) 80 MG tablet Take 80 mg by mouth daily.  Marland Kitchen buPROPion (WELLBUTRIN SR) 150 MG 12 hr tablet Take 150 mg by mouth 2 (two) times daily.  . calcium carbonate (OSCAL) 1500 (600 Ca) MG TABS tablet Take 600 mg by mouth 2 (two) times daily.  . Cholecalciferol (VITAMIN D3) 125 MCG (5000 UT) CAPS Take 1 capsule (5,000 Units total) by mouth daily.  . Continuous Blood Gluc Sensor (FREESTYLE LIBRE 14 DAY SENSOR) MISC Inject 1 each into the skin every 14 (fourteen) days. Use as directed.  Marland Kitchen esomeprazole (NEXIUM) 40 MG capsule Take 1 capsule (40 mg total) by  mouth 2 (two) times daily before a meal. (Patient taking differently: Take 40 mg by mouth daily. )  . fexofenadine (ALLEGRA) 180 MG tablet Take 180 mg by mouth daily.    Marland Kitchen gabapentin (NEURONTIN) 300 MG capsule Take 300 mg by mouth 2 (two) times daily.  Marland Kitchen HUMALOG KWIKPEN 100 UNIT/ML KwikPen INJECT 0.1-0.16 (10-16 UNITS) SQ 3 TIMES DAILY  . insulin degludec (TRESIBA FLEXTOUCH) 100 UNIT/ML FlexTouch Pen Inject 0.6 mLs (60 Units total) into the skin at bedtime.  Marland Kitchen levothyroxine (SYNTHROID) 137 MCG tablet Take 1 tablet (137 mcg total) by mouth daily before breakfast.  . lisinopril-hydrochlorothiazide (ZESTORETIC) 20-25 MG tablet Take 2 tablets by mouth daily.  . metFORMIN (GLUCOPHAGE-XR) 500 MG 24 hr tablet TAKE ONE TABLET BY MOUTH TWICE DAILY  . ONETOUCH DELICA LANCETS 33G MISC 4 TIMES DAILY  . ONETOUCH VERIO test strip 4 TIMES DAILY  . oxybutynin (DITROPAN-XL) 5 MG 24 hr tablet Take 5 mg by mouth at bedtime.  Marland Kitchen oxyCODONE-acetaminophen (PERCOCET/ROXICET) 5-325 MG tablet Take 1 tablet by mouth every 6 (six) hours as needed for moderate pain or severe pain.  Marland Kitchen Potassium 99 MG TABS Take 99 mg by mouth 2 (two) times daily.  . rizatriptan (MAXALT-MLT) 10 MG disintegrating tablet Take 10 mg by mouth every 2 (two) hours as needed for migraine.  Marland Kitchen rOPINIRole (REQUIP) 2 MG tablet Take 4 mg by mouth at bedtime.   . simvastatin (ZOCOR) 40 MG tablet Take 40 mg by mouth every evening.   . [DISCONTINUED] insulin aspart (NOVOLOG FLEXPEN) 100 UNIT/ML FlexPen Inject 10-16 Units into the skin 3 (three) times daily before meals.  . [DISCONTINUED] levothyroxine (SYNTHROID) 150 MCG tablet TAKE ONE TABLET EVERY MORNING   No facility-administered encounter medications on file as of 08/17/2019.   ALLERGIES: Allergies  Allergen Reactions  . Aspirin Itching  . Flonase [Fluticasone Propionate] Itching  . Prednisone Itching  . Tramadol Itching   VACCINATION STATUS:  There is no immunization history on file for this  patient.  Diabetes She presents for her follow-up diabetic visit. She has type 2 diabetes mellitus. Her disease course has been stable. Pertinent negatives for hypoglycemia include no confusion, headaches, pallor or seizures. Pertinent negatives for diabetes include no chest pain, no fatigue, no polydipsia, no polyphagia and no polyuria. Symptoms are stable. There are no diabetic complications. Risk factors for coronary artery disease include diabetes mellitus, dyslipidemia, tobacco exposure, sedentary lifestyle and hypertension. Current diabetic treatment includes insulin injections and oral agent (dual therapy). She is compliant with treatment most of the time. Her weight is increasing steadily. She is following a generally unhealthy diet. When asked about meal planning, she  reported none. She rarely participates in exercise. She monitors blood glucose at home 3-4 x per day. Blood glucose monitoring compliance is adequate. Her home blood glucose trend is fluctuating minimally. Her breakfast blood glucose range is generally 130-140 mg/dl. Her lunch blood glucose range is generally 140-180 mg/dl. Her dinner blood glucose range is generally 140-180 mg/dl. Her bedtime blood glucose range is generally 140-180 mg/dl. Her overall blood glucose range is 140-180 mg/dl. An ACE inhibitor/angiotensin II receptor blocker is being taken.  Thyroid Problem Presents for follow-up (She has postsurgical hypothyroidism.  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 Patient reports no cold intolerance, diarrhea, fatigue, heat intolerance or palpitations. The symptoms have been stable. Past treatments include levothyroxine. Prior procedures include thyroidectomy. Her past medical history is significant for diabetes and hyperlipidemia.  Hypertension This is a chronic problem. The current episode started more than 1 year ago. The problem is controlled. Pertinent negatives include  no chest pain, headaches, palpitations or shortness of breath. Risk factors for coronary artery disease include dyslipidemia, diabetes mellitus, smoking/tobacco exposure, sedentary lifestyle and post-menopausal state. Past treatments include ACE inhibitors. Identifiable causes of hypertension include a thyroid problem.  Hyperlipidemia This is a chronic problem. The current episode started more than 1 year ago. Recent lipid tests were reviewed and are high. Exacerbating diseases include diabetes. Pertinent negatives include no chest pain, myalgias or shortness of breath. Current antihyperlipidemic treatment includes statins. Risk factors for coronary artery disease include diabetes mellitus, dyslipidemia, hypertension, a sedentary lifestyle and post-menopausal.     Review of systems  Constitutional: + Minimally fluctuating body weight,  current  Body mass index is 34.9 kg/m. , no fatigue, no subjective hyperthermia, no subjective hypothermia Eyes: no blurry vision, no xerophthalmia ENT: no sore throat, no nodules palpated in throat, no dysphagia/odynophagia, no hoarseness Cardiovascular: no Chest Pain, no Shortness of Breath, no palpitations, no leg swelling Respiratory: no cough, no shortness of breath Gastrointestinal: no Nausea/Vomiting/Diarhhea Musculoskeletal: no muscle/joint aches Skin: no rashes, no hyperemia Neurological: no tremors, no numbness, no tingling, no dizziness Psychiatric: no depression, no anxiety   Objective:    BP 114/71   Pulse 82   Ht  (1.575 m)   Wt 190 lb 12.8 oz (86.5 kg)   BMI 34.90 kg/m   Wt Readings from Last 3 Encounters:  08/17/19 190 lb 12.8 oz (86.5 kg)  08/01/19 190 lb (86.2 kg)  06/20/19 190 lb (86.2 kg)     Physical Exam- Limited  Constitutional:  Body mass index is 34.9 kg/m. , not in acute distress, normal state of mind Eyes:  EOMI, no exophthalmos Neck: Supple Thyroid: No gross goiter Respiratory: Adequate breathing  efforts Musculoskeletal: no gross deformities, strength intact in all four extremities, no gross restriction of joint movements Skin:  no rashes, no hyperemia Neurological: no tremor with outstretched hands,    Recent Results (from the past 2160 hour(s))  COMPLETE METABOLIC PANEL WITH GFR     Status: Abnormal   Collection Time: 08/09/19  9:07 AM  Result Value Ref Range   Glucose, Bld 122 (H) 65 - 99 mg/dL    Comment: .            Fasting reference interval . For someone without known diabetes, a glucose value between 100 and 125 mg/dL is consistent with prediabetes and should be confirmed with a follow-up test. .    BUN 24 7 - 25 mg/dL   Creat 1.61 (H) 0.96 - 0.99  mg/dL    Comment: For patients >64 years of age, the reference limit for Creatinine is approximately 13% higher for people identified as African-American. .    GFR, Est Non African American 48 (L) > OR = 60 mL/min/1.5073m2   GFR, Est African American 55 (L) > OR = 60 mL/min/1.5173m2   BUN/Creatinine Ratio 20 6 - 22 (calc)   Sodium 135 135 - 146 mmol/L   Potassium 5.1 3.5 - 5.3 mmol/L   Chloride 103 98 - 110 mmol/L   CO2 21 20 - 32 mmol/L   Calcium 9.0 8.6 - 10.4 mg/dL   Total Protein 6.1 6.1 - 8.1 g/dL   Albumin 3.7 3.6 - 5.1 g/dL   Globulin 2.4 1.9 - 3.7 g/dL (calc)   AG Ratio 1.5 1.0 - 2.5 (calc)   Total Bilirubin 0.3 0.2 - 1.2 mg/dL   Alkaline phosphatase (APISO) 60 37 - 153 U/L   AST 22 10 - 35 U/L   ALT 28 6 - 29 U/L  Microalbumin / creatinine urine ratio     Status: Abnormal   Collection Time: 08/09/19  9:07 AM  Result Value Ref Range   Creatinine, Urine 65 20 - 275 mg/dL   Microalb, Ur 16.128.9 mg/dL    Comment: Reference Range Not established    Microalb Creat Ratio 445 (H) <30 mcg/mg creat    Comment: . The ADA defines abnormalities in albumin excretion as follows: Marland Kitchen. Category         Result (mcg/mg creatinine) . Normal                    <30 Microalbuminuria         30-299  Clinical albuminuria    > OR = 300 . The ADA recommends that at least two of three specimens collected within a 3-6 month period be abnormal before considering a patient to be within a diagnostic category.   Lipid panel     Status: Abnormal   Collection Time: 08/09/19  9:07 AM  Result Value Ref Range   Cholesterol 183 <200 mg/dL   HDL 53 > OR = 50 mg/dL   Triglycerides 096263 (H) <150 mg/dL    Comment: . If a non-fasting specimen was collected, consider repeat triglyceride testing on a fasting specimen if clinically indicated.  Perry MountJacobson et al. J. of Clin. Lipidol. 2015;9:129-169. Marland Kitchen.    LDL Cholesterol (Calc) 92 mg/dL (calc)    Comment: Reference range: <100 . Desirable range <100 mg/dL for primary prevention;   <70 mg/dL for patients with CHD or diabetic patients  with > or = 2 CHD risk factors. Marland Kitchen. LDL-C is now calculated using the Martin-Hopkins  calculation, which is a validated novel method providing  better accuracy than the Friedewald equation in the  estimation of LDL-C.  Horald PollenMartin SS et al. Lenox AhrJAMA. 0454;098(112013;310(19): 2061-2068  (http://education.[facility].com/faq/FAQ164)    Total CHOL/HDL Ratio 3.5 <5.0 (calc)   Non-HDL Cholesterol (Calc) 130 (H) <130 mg/dL (calc)    Comment: For patients with diabetes plus 1 major ASCVD risk  factor, treating to a non-HDL-C goal of <100 mg/dL  (LDL-C of <91<70 mg/dL) is considered a therapeutic  option.   TSH     Status: Abnormal   Collection Time: 08/09/19  9:07 AM  Result Value Ref Range   TSH 0.02 (L) 0.40 - 4.50 mIU/L  T4, free     Status: None   Collection Time: 08/09/19  9:07 AM  Result Value Ref Range   Free  T4 1.8 0.8 - 1.8 ng/dL  VITAMIN D 25 Hydroxy (Vit-D Deficiency, Fractures)     Status: Abnormal   Collection Time: 08/09/19  9:07 AM  Result Value Ref Range   Vit D, 25-Hydroxy 26 (L) 30 - 100 ng/mL    Comment: Vitamin D Status         25-OH Vitamin D: . Deficiency:                    <20 ng/mL Insufficiency:             20 - 29  ng/mL Optimal:                 > or = 30 ng/mL . For 25-OH Vitamin D testing on patients on  D2-supplementation and patients for whom quantitation  of D2 and D3 fractions is required, the [facility](TM) 25-OH VIT D, (D2,D3), LC/MS/MS is recommended: order  code [phone] (patients >13yrs). See Note 1 . Note 1 . For additional information, please refer to  http://education.[facility].com/faq/FAQ199  (This link is being provided for informational/ educational purposes only.)   HgB A1c     Status: Abnormal   Collection Time: 08/17/19 10:55 AM  Result Value Ref Range   Hemoglobin A1C 6.7 (A) 4.0 - 5.6 %   HbA1c POC (<> result, manual entry)     HbA1c, POC (prediabetic range)     HbA1c, POC (controlled diabetic range)       Diabetic Labs (most recent): Lab Results  Component Value Date   HGBA1C 6.7 (A) 08/17/2019   HGBA1C 6.6 03/17/2019   HGBA1C 6.5 (H) 12/07/2018   Lipid Panel     Component Value Date/Time   CHOL 183 08/09/2019 0907   TRIG 263 (H) 08/09/2019 0907   HDL 53 08/09/2019 0907   CHOLHDL 3.5 08/09/2019 0907   VLDL 33 (H) 03/13/2016 1015   LDLCALC 92 08/09/2019 0907      Assessment & Plan:   1. Uncontrolled type 2 diabetes mellitus with complication, with long-term current use of insulin ([place])  -She returns with her glycemic profile showing tightened fasting blood glucose readings, near target postprandial readings.   She presented with near target glycemic profile both fasting and postprandial.  Her point-of-care A1c 6.7%, generally improving from 9.1%.    -Her diabetes is complicated by stage 3 renal insufficiency, chronic heavy smoking, sedentary lifestyle and patient remains at extremely high risk for more acute and chronic complications of diabetes which include CAD, CVA, CKD, retinopathy, and neuropathy. These are all discussed in detail with the patient.  Recent labs reviewed and discussed with her.   - I have re-counseled the patient on  diet management   by adopting a carbohydrate restricted / protein rich  Diet.  - she  admits there is a room for improvement in her diet and drink choices. -  Suggestion is made for her to avoid simple carbohydrates  from her diet including Cakes, Sweet Desserts / Pastries, Ice Cream, Soda (diet and regular), Sweet Tea, Candies, Chips, Cookies, Sweet Pastries,  Store Bought Juices, Alcohol in Excess of  1-2 drinks a day, Artificial Sweeteners, Coffee Creamer, and "Sugar-free" Products. This will help patient to have stable blood glucose profile and potentially avoid unintended weight gain.   - Patient is advised to stick to a routine mealtimes to eat 3 meals  a day and avoid unnecessary snacks ( to snack only to correct hypoglycemia).   - I have approached patient with the  following individualized plan to manage diabetes and patient agrees.  -She has achieved control of diabetes again.    -She will continue to need intensive treatment with basal/bolus insulin in order for her to maintain control of diabetes to target.   -She is monitoring blood glucose 4 times a day-would benefit from a CGM device.  She is awaiting decision on her freestyle libre CGM device.   -In the meantime, she will continue with Tresiba 60 units nightly,  continue Humalog  10-16 units 3 times daily AC associated with monitoring of blood glucose strictly 4 times a day-daily before breakfast and at bedtime.   -Patient is encouraged to call clinic for blood glucose levels less than 70 or above 200 mg /dl. -She is tolerating metformin, advised to continue metformin 500 mg p.o. twice daily- after breakfast and supper.       - Patient specific target  for A1c; LDL, HDL, Triglycerides, were discussed in detail.  2) BP/HTN: Her blood pressure is controlled to target.  She is advised to continue her current blood pressure medications including lisinopril 10 mg p.o. daily, hydrochlorothiazide 25 mg p.o. daily.   3) Lipids/HPL:  Recent lipid panel showed uncontrolled LDL at 111.  She is advised to continue atorvastatin 80 mg p.o. nightly,   and fenofibrate 160 mg p.o. Daily.  4)  Postsurgical hypothyroidism  -Her previsit thyroid function tests are consistent with slight over replacement.  I discussed and lowered her levothyroxine to 137 mcg daily before breakfast.     - We discussed about the correct intake of her thyroid hormone, on empty stomach at fasting, with water, separated by at least 30 minutes from breakfast and other medications,  and separated by more than 4 hours from calcium, iron, multivitamins, acid reflux medications (PPIs). -Patient is made aware of the fact that thyroid hormone replacement is needed for life, dose to be adjusted by periodic monitoring of thyroid function tests.   5)  Weight/Diet: Her BMI is 34.9-she is a candidate for modest weight loss.  Declines to see the dietitian, exercise, and carbohydrates information provided.  6) vitamin D deficiency -I discussed and prescribed vitamin D3 5000 units daily for 90 days.  7) Chronic Care/Health Maintenance:  -Patient is on ACEI/ARB and Statin medications and encouraged to continue to follow up with Ophthalmology, Podiatrist at least yearly or according to recommendations, and advised to quit smoking. I have recommended yearly flu vaccine and pneumonia vaccination at least every 5 years; and  sleep for at least 7 hours a day.   8) fall/fracture: Since her last visit, she fell and fractured her lower extremity which did not require surgery.  She will be given a baseline bone density to screen for osteoporosis.   The patient was counseled on the dangers of tobacco use, and was advised to quit.  Reviewed strategies to maximize success, including removing cigarettes and smoking materials from environment.  - I advised patient to maintain close follow up with Jacqualine Mau, NP for primary care needs.  - Time spent on this patient care  encounter:  45 min, of which > 50% was spent in  counseling and the rest reviewing her blood glucose logs , discussing her hypoglycemia and hyperglycemia episodes, reviewing her current and  previous labs / studies  ( including abstraction from other facilities) and medications  doses and developing a  long term treatment plan and documenting her care.   Please refer to Patient Instructions for Blood Glucose Monitoring and  Insulin/Medications Dosing Guide"  in media tab for additional information. Please  also refer to " Patient Self Inventory" in the Media  tab for reviewed elements of pertinent patient history.  Nayelis Minor Safran participated in the discussions, expressed understanding, and voiced agreement with the above plans.  All questions were answered to her satisfaction. she is encouraged to contact clinic should she have any questions or concerns prior to her return visit.  Follow up plan: -Return in about 3 months (around 11/17/2019) for Bring Meter and Logs- A1c in Office, DXA Scan B4 NV.  Marquis Lunch, MD Phone: [phone]  Fax: [phone]  -  This note was partially dictated with voice recognition software. Similar sounding words can be transcribed inadequately or may not  be corrected upon review.  08/17/2019, 1:27 PM

## 2019-09-05 ENCOUNTER — Other Ambulatory Visit: Payer: Self-pay | Admitting: "Endocrinology

## 2019-09-26 ENCOUNTER — Other Ambulatory Visit: Payer: Self-pay | Admitting: "Endocrinology

## 2019-10-30 DEATH — deceased

## 2019-11-21 ENCOUNTER — Telehealth: Payer: Medicare Other | Admitting: "Endocrinology

## 2020-02-14 IMAGING — MG MM DIGITAL SCREENING BILAT W/ TOMO W/ CAD
8 series · 8 of 24 positions shown · non-contrast
Comparison: Previous exam(s).

CLINICAL DATA: Screening.

EXAM:
DIGITAL SCREENING BILATERAL MAMMOGRAM WITH TOMO AND CAD

[L CC synth-2D]
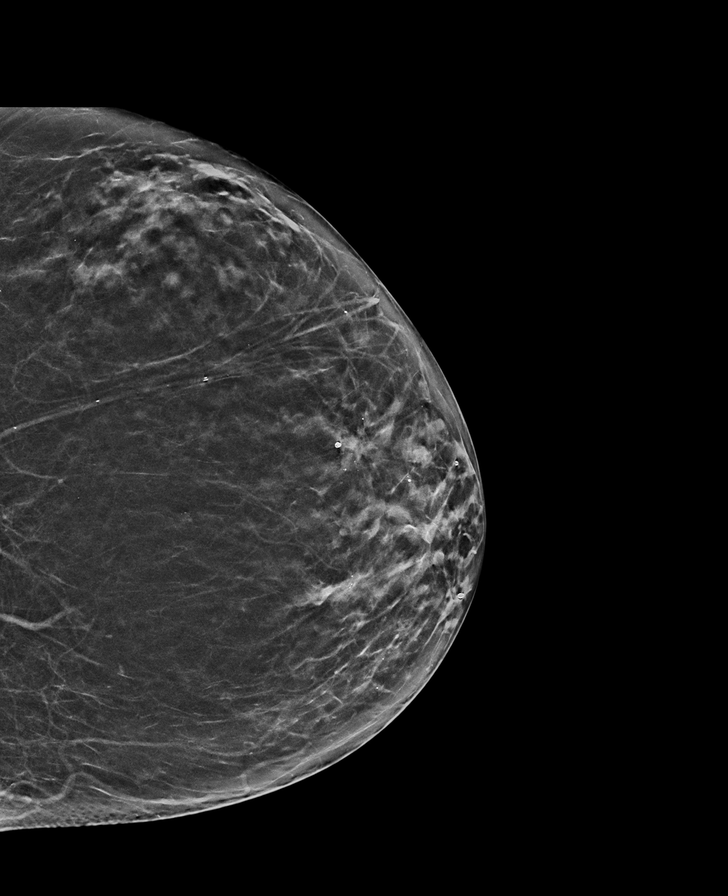

[R CC synth-2D]
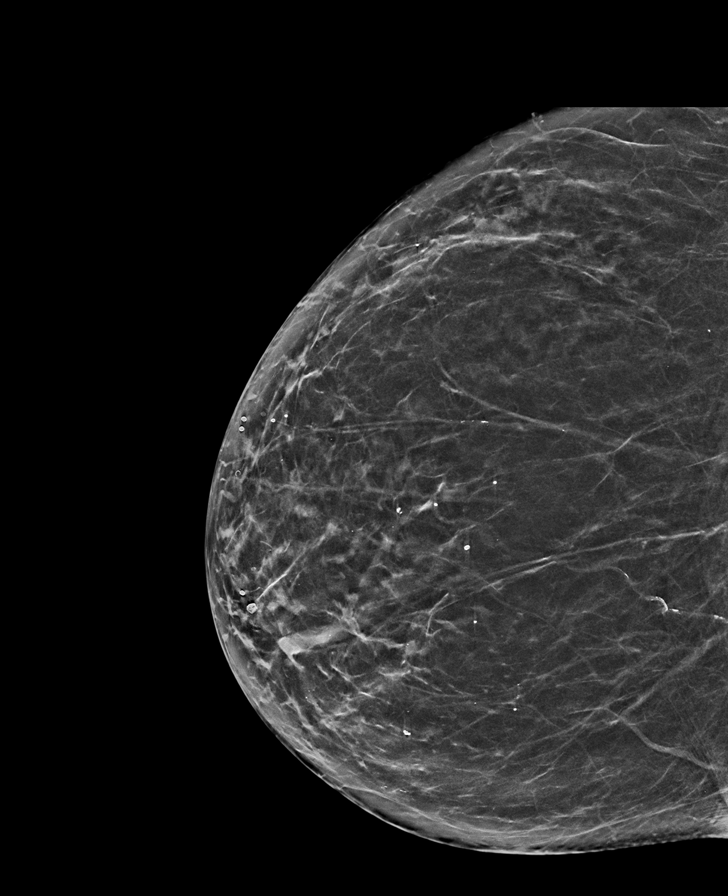

[L MLO synth-2D]
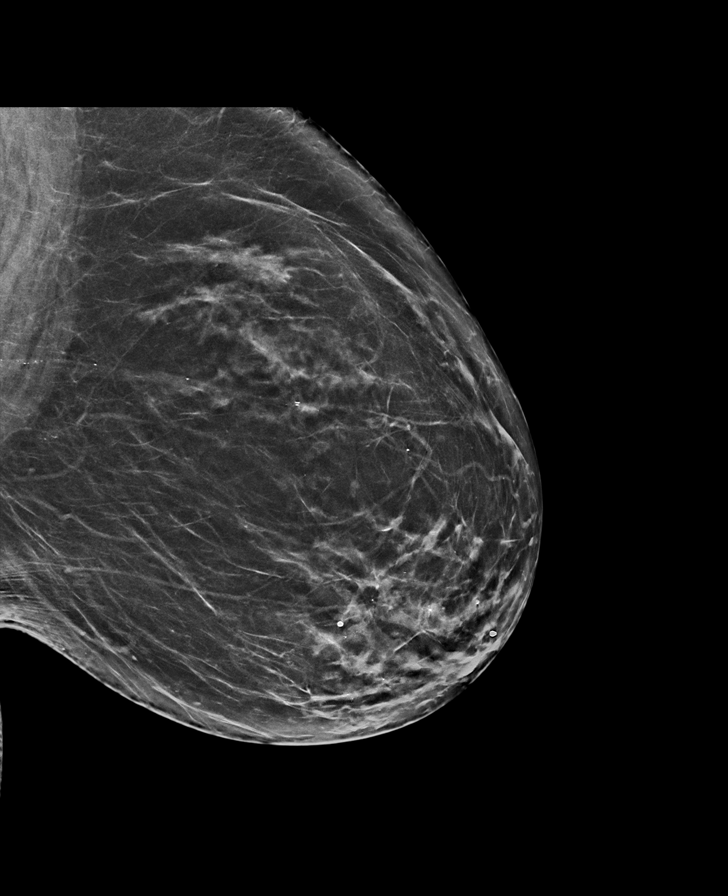

[R MLO synth-2D]
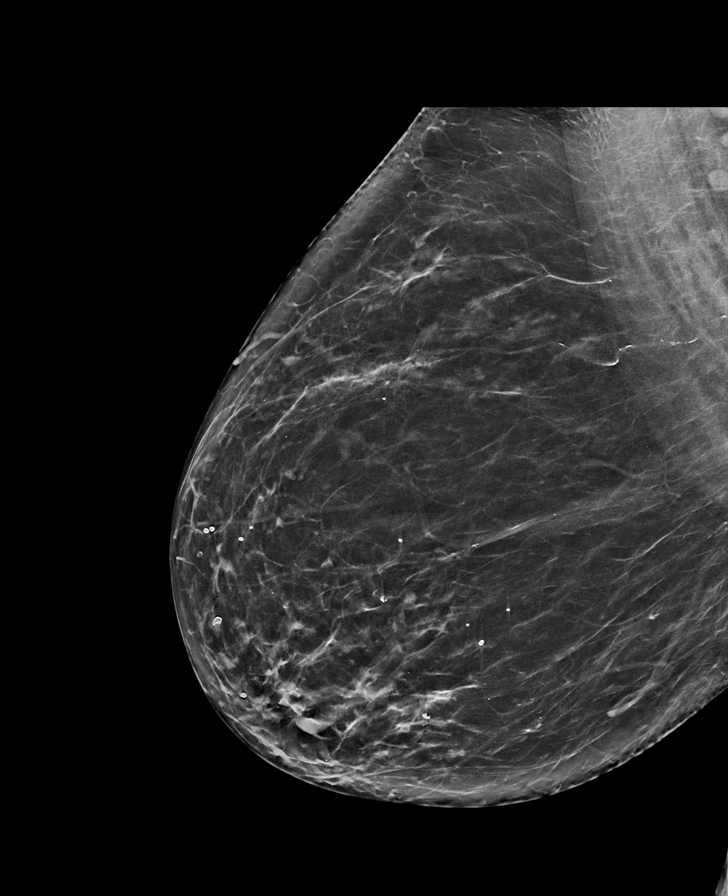

[L MLO tomo · tomo slice 37/73.0]
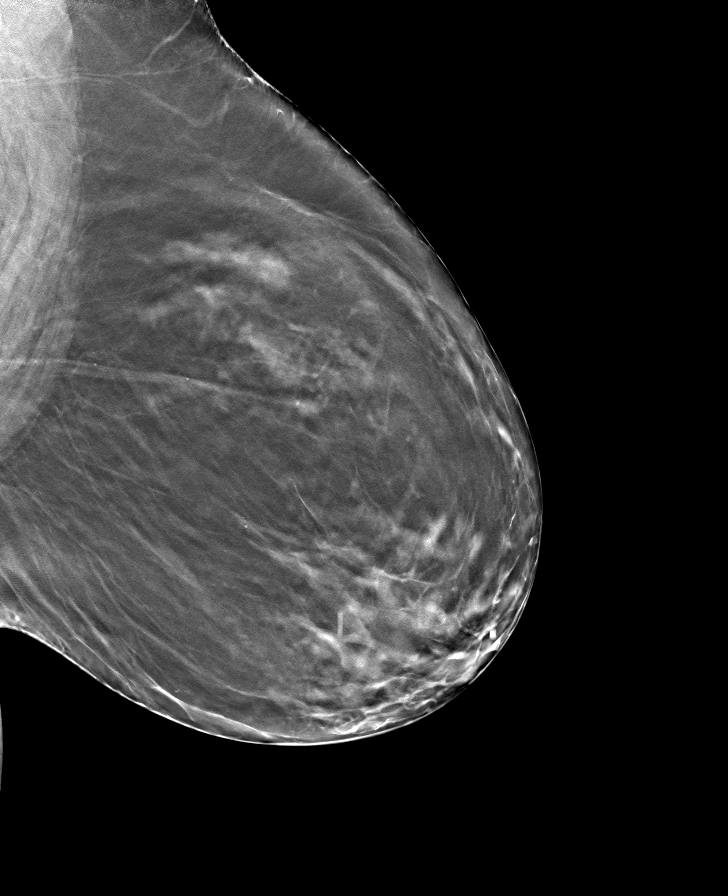

[R MLO tomo · tomo slice 38/75.0]
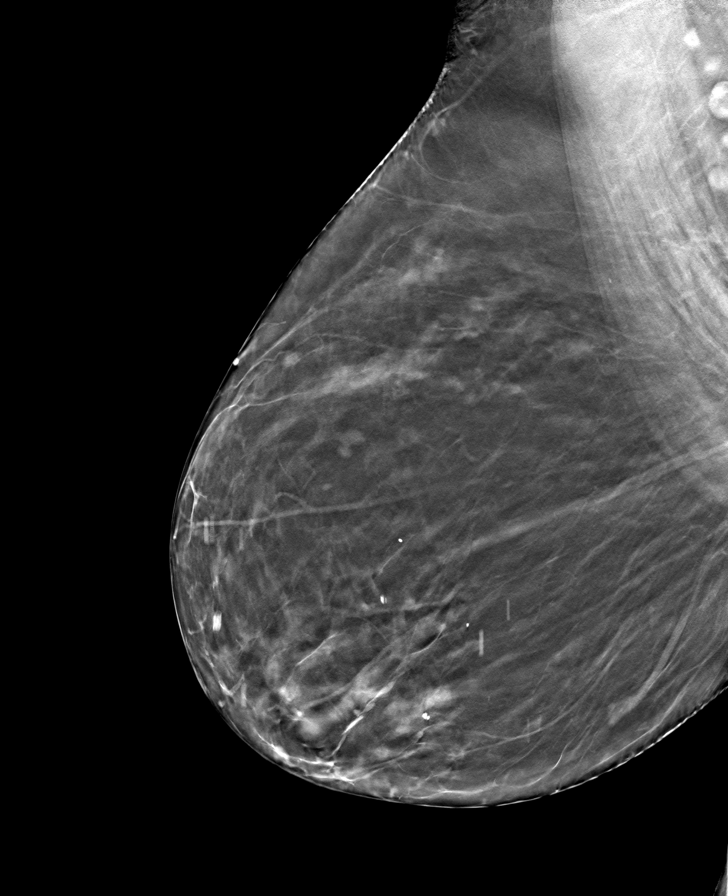

[L CC tomo · tomo slice 31/61.0]
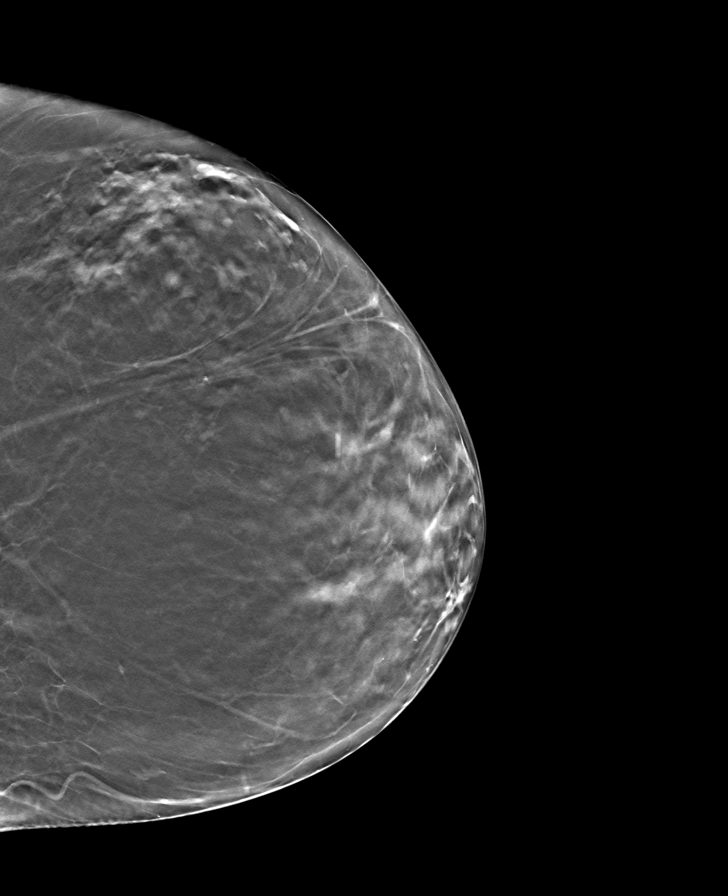

[R CC tomo · tomo slice 33/65.0]
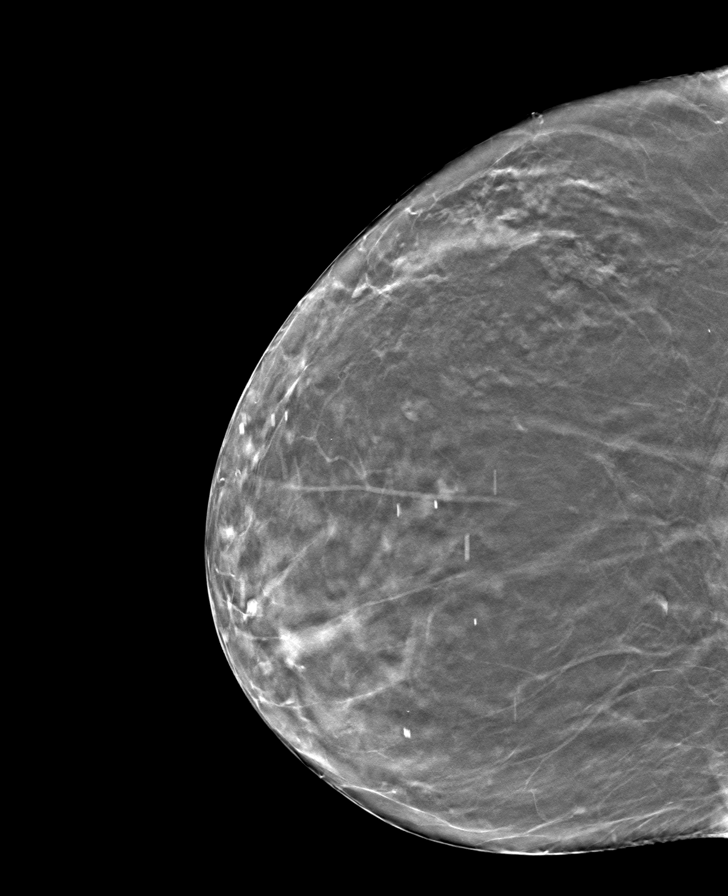

[8 of 24 positions shown; findings below may reference images not displayed]

ACR Breast Density Category b: There are scattered areas of
fibroglandular density.
FINDINGS: There are no findings suspicious for malignancy. Images were
processed with CAD.
IMPRESSION: No mammographic evidence of malignancy. A result letter of this
screening mammogram will be mailed directly to the patient.

RECOMMENDATION:
Screening mammogram in one year. (Code:[id number])

BI-RADS CATEGORY  1: Negative.
